# Patient Record
Sex: Male | Born: 1950 | Race: White | Hispanic: No | Smoking: Former smoker
Health system: Southern US, Community
[De-identification: ages and names within clinical notes are randomized; demographics above are authoritative.]

## PROBLEM LIST (undated history)

## (undated) DIAGNOSIS — D126 Benign neoplasm of colon, unspecified: Secondary | ICD-10-CM

## (undated) DIAGNOSIS — A6000 Herpesviral infection of urogenital system, unspecified: Secondary | ICD-10-CM

## (undated) DIAGNOSIS — K2270 Barrett's esophagus without dysplasia: Secondary | ICD-10-CM

## (undated) DIAGNOSIS — F329 Major depressive disorder, single episode, unspecified: Secondary | ICD-10-CM

## (undated) DIAGNOSIS — I10 Essential (primary) hypertension: Secondary | ICD-10-CM

## (undated) DIAGNOSIS — K635 Polyp of colon: Secondary | ICD-10-CM

## (undated) DIAGNOSIS — L309 Dermatitis, unspecified: Secondary | ICD-10-CM

## (undated) DIAGNOSIS — Z9289 Personal history of other medical treatment: Secondary | ICD-10-CM

## (undated) DIAGNOSIS — G459 Transient cerebral ischemic attack, unspecified: Secondary | ICD-10-CM

## (undated) DIAGNOSIS — I422 Other hypertrophic cardiomyopathy: Secondary | ICD-10-CM

## (undated) DIAGNOSIS — Z77090 Contact with and (suspected) exposure to asbestos: Secondary | ICD-10-CM

## (undated) DIAGNOSIS — E785 Hyperlipidemia, unspecified: Secondary | ICD-10-CM

## (undated) DIAGNOSIS — K219 Gastro-esophageal reflux disease without esophagitis: Secondary | ICD-10-CM

## (undated) DIAGNOSIS — F32A Depression, unspecified: Secondary | ICD-10-CM

## (undated) HISTORY — DX: Gastro-esophageal reflux disease without esophagitis: K21.9

## (undated) HISTORY — PX: ULNAR NERVE TRANSPOSITION: SHX2595

## (undated) HISTORY — DX: Essential (primary) hypertension: I10

## (undated) HISTORY — DX: Major depressive disorder, single episode, unspecified: F32.9

## (undated) HISTORY — DX: Depression, unspecified: F32.A

## (undated) HISTORY — DX: Other hypertrophic cardiomyopathy: I42.2

## (undated) HISTORY — DX: Hyperlipidemia, unspecified: E78.5

## (undated) HISTORY — DX: Personal history of other medical treatment: Z92.89

## (undated) HISTORY — DX: Polyp of colon: K63.5

## (undated) HISTORY — PX: SPINE SURGERY: SHX786

## (undated) HISTORY — DX: Barrett's esophagus without dysplasia: K22.70

## (undated) HISTORY — PX: CERVICAL SPINE SURGERY: SHX589

## (undated) HISTORY — DX: Contact with and (suspected) exposure to asbestos: Z77.090

## (undated) HISTORY — DX: Dermatitis, unspecified: L30.9

## (undated) HISTORY — DX: Benign neoplasm of colon, unspecified: D12.6

## (undated) HISTORY — DX: Herpesviral infection of urogenital system, unspecified: A60.00

## (undated) HISTORY — DX: Transient cerebral ischemic attack, unspecified: G45.9

---

## 2000-02-10 ENCOUNTER — Encounter (INDEPENDENT_AMBULATORY_CARE_PROVIDER_SITE_OTHER): Payer: Self-pay | Admitting: Specialist

## 2000-02-10 ENCOUNTER — Ambulatory Visit (HOSPITAL_COMMUNITY): Admission: RE | Admit: 2000-02-10 | Discharge: 2000-02-10 | Payer: Self-pay | Admitting: Gastroenterology

## 2000-08-18 ENCOUNTER — Ambulatory Visit (HOSPITAL_COMMUNITY): Admission: RE | Admit: 2000-08-18 | Discharge: 2000-08-18 | Payer: Self-pay | Admitting: Neurosurgery

## 2001-04-14 ENCOUNTER — Emergency Department (HOSPITAL_COMMUNITY): Admission: EM | Admit: 2001-04-14 | Discharge: 2001-04-14 | Payer: Self-pay | Admitting: Emergency Medicine

## 2003-01-02 ENCOUNTER — Encounter: Payer: Self-pay | Admitting: Family Medicine

## 2003-01-02 ENCOUNTER — Ambulatory Visit (HOSPITAL_COMMUNITY): Admission: RE | Admit: 2003-01-02 | Discharge: 2003-01-02 | Payer: Self-pay | Admitting: Family Medicine

## 2003-09-30 ENCOUNTER — Ambulatory Visit (HOSPITAL_COMMUNITY): Admission: RE | Admit: 2003-09-30 | Discharge: 2003-09-30 | Payer: Self-pay | Admitting: General Surgery

## 2003-10-15 ENCOUNTER — Ambulatory Visit (HOSPITAL_COMMUNITY): Admission: RE | Admit: 2003-10-15 | Discharge: 2003-10-15 | Payer: Self-pay | Admitting: General Surgery

## 2003-10-15 ENCOUNTER — Encounter (INDEPENDENT_AMBULATORY_CARE_PROVIDER_SITE_OTHER): Payer: Self-pay | Admitting: *Deleted

## 2010-09-30 ENCOUNTER — Ambulatory Visit: Payer: Self-pay | Admitting: Cardiovascular Disease

## 2010-12-17 LAB — HM COLONOSCOPY

## 2011-03-22 ENCOUNTER — Encounter: Payer: Self-pay | Admitting: Family Medicine

## 2011-04-15 NOTE — Op Note (Signed)
[place]  Patient:    Walter Cross, Walter Cross                     MRN: [id number] Proc. Date: 08/18/00 Adm. Date:  [id number] Disc. 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  PROCEDURE PERFORMED:  Right ulnar nerve release.  SURGEON:  Julio Sicks, M.D.  ASSISTANT:  None.  ANESTHESIA:  General endotracheal.  INDICATIONS:  The patient is a 60 year old male with history of a right-sided ulnar neuropathy which has failed conservative management.  Workup including EMGs and nerve conduction studies demonstrates entrapment at the elbow.  The patient presents now for ulnar nerve release and for hopeful relief of his symptoms.  DESCRIPTION OF PROCEDURE:  The patient was brought to the operating room and on the table in a supine position.  After an adequate level of anesthesia was achieved, the patients right upper extremity was prepped and draped sterilely.  A curvilinear incision was made around his medial epicondyle. This was carried down sharply through the subcutaneous tissue.  The ulnar nerve was identified proximally and decompressed through the cubital tunnel and ulnar groove.  All branches of the ulnar nerve were spared.  There is no evidence of any continued compression of the ulnar nerve.  The ulnar nerve was stable with flexion and extension of the elbow.  At this point, the wound was copiously irrigated with antibiotic solution. The wound was then closed with 3-0 Vicryl suture in a subcutaneous layer, and a running 3-0 nylon in an interlocking vertical mattress fashion.  A sterile dressing was applied.  There were no apparent complications.  The patient tolerated the procedure well and he returned to the recovery  room postoperative. DD:  08/18/00 TD:  08/20/00 Job: [id number]

## 2011-04-15 NOTE — Op Note (Signed)
NAME:  Walter Cross, Walter Cross                      ACCOUNT NO.:  [id number]   MEDICAL RECORD NO.:  [id number]                   PATIENT TYPE:  OIB   LOCATION:  2899                                 FACILITY:  [facility]   PHYSICIAN:  Leonie Man, M.D.                DATE OF BIRTH:  [birth year]   DATE OF PROCEDURE:  10/15/2003  DATE OF DISCHARGE:  10/15/2003                                 OPERATIVE REPORT   PREOPERATIVE DIAGNOSIS:  Right axillary lymphadenopathy.   POSTOPERATIVE DIAGNOSIS:  Abscess right axillary lymph nodes.   SURGEON:  Leonie Man, M.D.   ASSISTANT:  Nurse.   ANESTHESIA:  General.   INDICATIONS:  This patient is a 60 year old man who presents with a painful  mass in the right axilla felt to be a lymph node.  He underwent two courses  of antibiotic therapy with cephalosporins and with Biaxin with minimal  resolution of the node.  The node remains somewhat painful and has gotten  very slightly smaller;  however,  persisted after this course.  I discussed  excisional biopsy with the patient, and he agreed to come to the operating  room for excision of this mass.  During the course of this illness, he had  no leukocytosis or fever.  He does not have any cough or sputum production.   PROCEDURE:  Following the induction of satisfactory general anesthesia, the  right axilla was prepped and draped to be included in the sterile operative  field.  Upon palpation of the axilla under anesthesia, a large axillary mass  which was felt to be matted lymph nodes could be felt within the axilla.  A  transaxillary incision was made through the skin and subcutaneous tissues,  and carried down to the region of the mass.  Upon dissecting the mass, a  large pus pocket was entered.  Cultures were taken of this area, and the  entire inflamed mass was removed in its entirety and forwarded for  pathologic evaluation.  Gram stains of the effluent showed PMNs but no  evidence of organisms.   The mass and cultures were sent for fungus, AFB, and  regular cultures.  Another similar mass could be palpated somewhat higher in  the axilla, but it could not be reached by the transaxillary incision, so a  higher incision was made.  This incision was carried down through the skin  and subcutaneous tissues.  This mass, however, turned out to be a large  bulge within the latissimus dorsi muscle.  Both incisions were irrigated  thoroughly with normal saline. A 12-French Blake drain was placed within the  abscess cavity and brought out through the skin.  The subcutaneous tissues  were closed with interrupted 3-0 Vicryl sutures, and the skin was closed  with a 4-0 Monocryl.  After changing gloves, the upper wound was closed with  interrupted 3-0 Vicryl suture and a running 4-0 Monocryl suture.  The Linwood  drain was attached to suction.  Sponge, instrument, and sharp  counts were all verified.  The wounds were reinforced with Steri-Strips, and  sterile dressings applied.  The anesthetic was reversed, and the patient  removed from the operating room to the recovery room in stable condition,  having tolerated the procedure well.                                               Leonie Man, M.D.    PB/MEDQ  D:  10/15/2003  T:  10/16/2003  Job:  [id number]   cc:   Teena Irani. Arlyce Dice, M.D.  [address]  [place]  [place] [address]  Fax: [phone]

## 2011-04-15 NOTE — Procedures (Signed)
[name]. [facility]  Patient:    Walter Cross, Walter Cross                     MRN: [id number] Proc. Date: 02/10/00 Adm. Date:  [id number] Attending:  Rich Brave CC:         Teena Irani. Arlyce Dice, M.D.                           Procedure Report  PROCEDURE:  Upper endoscopy with biopsies.  SURGEON:  Florencia Reasons, M.D.  INDICATIONS:  Chronic heartburn symptoms, which have responded nicely to ______.  FINDINGS:  Short segment of Barretts esophagus, probable.  PROCEDURE:  The nature, purpose, risks of the procedure have been discussed with the patient, who provided written consent.  Sedation for this procedure and the  colonoscopy, which followed it, totalled fentanyl 100 mcg and Versed 10 mg IV without arrhythmias or desaturation.  The Olympus small caliber adult video endoscope was passed under direct vision.  The vocal cords looked normal.  The esophagus was easily entered.  The distal esophagus had a 1-2 cm tongue of erythematous mucosa consistent with  short segment of Barretts esophagus, but no active esophagitis was seen.  There  was no evidence of neoplasia, varices or infection anywhere in the esophagus. o definite ring, stricture or significant hiatal hernia was appreciated.  The stomach was entered.  It contained no significant residual and it had normal mucosa without evidence of gastritis, erosions, ulcers, polyps or masses and the pylorus, duodenal bulb and second duodenum looked normal.  Biopsies were obtained of the apparent short segment of Barretts mucosa prior to removal of the scope.  Retroflexed view into the proximal stomach did not demonstrate any definite hiatal hernia.  The patient tolerated the procedure well and there were no apparent complications.  IMPRESSION:  Probable short segment of Barretts esophagus, consistent with patients history of reflux disease.  PLAN:  Await pathology on the biopsies with  consideration for surveillance endoscopies every two years if intestinal metaplasia is identified. Symptomatic management of reflux with proton-pump inhibitor. DD:  02/10/00 TD:  02/10/00 Job: [id number]

## 2011-04-15 NOTE — Procedures (Signed)
[place]. [facility]  Patient:    Walter Cross, Walter Cross                     MRN: [id number] Proc. Date: 02/10/00 Adm. Date:  [id number] Attending:  Rich Brave CC:         Teena Irani. Arlyce Dice, M.D.                           Procedure Report  PROCEDURE:  Colonoscopy with polypectomy.  SURGEON:  Florencia Reasons, M.D.  INDICATIONS:  A 60 year old with intermittent small volume hematochezia.  FINDINGS:  Rectal polyp.  PROCEDURE:  The nature, purpose, and risks of the procedure had been discussed ith the patient, who provided written consent.  Sedation for this procedure and the  upper endoscopy, which preceded it, totalled fentanyl 100 mcg and Versed 10 mg V without arrhythmias or desaturation.  The Olympus adult video colonoscope was inserted following an unremarkable digital rectal examination of the prostate gland.  The scope was advanced through a somewhat angulated and looping sigmoid region and then quite easily around the colon to the cecum and then for a short distance into a normal-appearing terminal ileum, where upon pullback was initiated.  The quality of the prep was excellent and it was felt that all areas were well seen.  In the mid rectum at about 9 cm from the external anal opening, I encountered a  semipedunculated 5 mm polyp removed by snare technique with complete hemostasis and no evidence of excess cautery.  The polyp came through the suction channel easily.  No other polyps were seen and there was no evidence of cancer, colitis, vascular malformations or diverticular disease.  Retroflexion view in the distal rectum prior to the polypectomy showed a hypertrophied anal papillae.  The patient did not appear to have excessive hemorrhoidal disease.  However, no alternative source of the patients bleeding as identified since the polyp was very smooth and clean-surfaced.  The patient tolerated the procedure well and there  were no apparent complications.  IMPRESSION: 1. Small rectal polyp removed as described above. 2. No definite cause for the intermittent rectal bleeding identified, so it was  presumed to be of hemorrhoidal origin.  PLAN:  Await pathology on polyp with follow-up in three years if it was adenomatous in character. DD:  02/10/00 TD:  02/10/00 Job: [id number]

## 2011-06-06 ENCOUNTER — Telehealth: Payer: Self-pay | Admitting: Cardiovascular Disease

## 2011-06-06 ENCOUNTER — Encounter (HOSPITAL_BASED_OUTPATIENT_CLINIC_OR_DEPARTMENT_OTHER)
Admission: RE | Admit: 2011-06-06 | Discharge: 2011-06-06 | Disposition: A | Discharge status: Home / Self Care | Payer: Worker's Compensation | Source: Ambulatory Visit | Attending: Orthopedic Surgery | Admitting: Orthopedic Surgery

## 2011-06-06 LAB — BASIC METABOLIC PANEL
BUN: 12 mg/dL (ref 6–23)
CO2: 28 mEq/L (ref 19–32)
Calcium: 9.1 mg/dL (ref 8.4–10.5)
Chloride: 102 mEq/L (ref 96–112)
Creatinine, Ser: 0.9 mg/dL (ref 0.50–1.35)
GFR calc Af Amer: >60 mL/min (ref >60)
GFR calc non Af Amer: >60 mL/min (ref >60)
Glucose, Bld: 118 mg/dL — ABNORMAL HIGH (ref 70–99)
Potassium: 4.3 mEq/L (ref 3.5–5.1)
Sodium: 138 mEq/L (ref 135–145)

## 2011-06-07 ENCOUNTER — Ambulatory Visit (HOSPITAL_BASED_OUTPATIENT_CLINIC_OR_DEPARTMENT_OTHER)
Admission: RE | Admit: 2011-06-07 | Discharge: 2011-06-07 | Disposition: A | Discharge status: Home / Self Care | Payer: Worker's Compensation | Source: Ambulatory Visit | Attending: Orthopedic Surgery | Admitting: Orthopedic Surgery

## 2011-06-07 DIAGNOSIS — IMO0002 Reserved for concepts with insufficient information to code with codable children: Secondary | ICD-10-CM | POA: Insufficient documentation

## 2011-06-07 DIAGNOSIS — X58XXXA Exposure to other specified factors, initial encounter: Secondary | ICD-10-CM | POA: Insufficient documentation

## 2011-06-07 DIAGNOSIS — I10 Essential (primary) hypertension: Secondary | ICD-10-CM | POA: Insufficient documentation

## 2011-06-07 DIAGNOSIS — Z01812 Encounter for preprocedural laboratory examination: Secondary | ICD-10-CM | POA: Insufficient documentation

## 2011-06-07 LAB — POCT HEMOGLOBIN-HEMACUE: Hemoglobin: 14.8 g/dL (ref 13.0–17.0)

## 2011-06-10 ENCOUNTER — Other Ambulatory Visit: Payer: Self-pay | Admitting: Cardiovascular Disease

## 2011-06-10 NOTE — Telephone Encounter (Signed)
Needs ov gave 30 with no refills and wrote it on script bottle to make ov.

## 2011-06-10 NOTE — Op Note (Signed)
Walter Cross, Walter Cross            ACCOUNT NO.:  [id number]  MEDICAL RECORD NO.:  [id number]  LOCATION:                                 FACILITY:  PHYSICIAN:  Walter Fitch. Romello Hoehn, M.D.      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  OPERATION: 1. 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 2. Application of a Ligamentotaxor external fixation frame to allow     reduction and early range of motion of pilon fracture of right ring     finger proximal interphalangeal joint/articular surface of middle     phalanx.  SURGEON:  Walter Fitch. Brissia Delisa, MD  ASSISTANT:  None.  ANESTHESIA:  General by LMA supplemented by digital block.  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  [facility] on [facility] 13, 2011, and noted to have a fracture of the articular surface of his right ring finger proximal interphalangeal joint, middle phalangeal joint surface with a 60% volar fragment.  He was splinted and advised to seek Hand Surgery consult.  For various reasons, he delayed his consultation until 24 days postinjury.  On exam at that time he was noted to  have a dorsally subluxed/dislocated PIP joint with ulnar deviation and a significant contacted middle phalangeal articular fracture surface to the dorsum of the proximal phalanx.  We advised Walter Cross as to the spectrum of treatment options for this complex fracture dislocation.  We advised him to try a PIP distractor that would allow extension block splinting and gradual reduction of the joint.  Alternative treatment strategies including a hemi hamate arthroplasty versus a traditional Eaton volar plate arthroplasty with collateral ligament resection were discussed and tabled at the present time.  He understands that for the salvage of this complex injury he might one day face implant arthroplasty of the PIP joint.  Questions were invited and answered in detail.  PROCEDURE:  Walter Cross was brought to room 2 of the [facility] surgical center and placed in supine position on the operating table.  Following an anesthesia consult with Walter Cross, general anesthesia by LMA technique was recommended and accepted.Under Dr. Marlane Mingle direct supervision, general anesthesia was induced, followed by routine Betadine scrub and paint of right upper extremity. A pneumatic tourniquet was applied to proximal right brachium.  Following a routine surgical time-out, the right arm was exsanguinated with an Esmarch bandage and arterial tourniquet inflated to 220 mmHg. The procedure  commenced with an attempt at traction and flexion of the joint.  This was performed with the aid of a C-arm fluoroscope that was brought into sterile field and properly draped.  There was levering of the middle phalanx against the proximal phalanx.  This was a static chronic dislocation.  Given the circumstance, we proceeded directly to application of external fixation frame.  Following the protocol of the Ligamentotaxor device, a 1.2-mm K-wire was placed through the axis rotation of the proximal phalangeal head,  confirmed with C-arm to be in proper position followed by use of a drill guide to place a diaphyseal transverse pin through the middle phalanx.  The device was assembled according to protocol and traction applied with warm screw springs.  A third K-wire was used to create stiffness of the frame.  After 15 minutes of traction, we were able to reduce the joint and flex between 0 and 70 degrees of flexion.  Our plan is to allow the finger to remain in a distracted position for 3 days.  Thereafter, we will see Walter Cross in the office and initiate flexion exercises and extension block splinting.  C-arm fluoroscopic images were obtained documenting satisfactory position of the pins and reduction of the joint.  The pin tracts were then dressed with Xeroflo, sterile gauze, and a Coban dressing.  Tape was used to secure the traction springs.  There were no apparent complications.     Walter Cross, M.D.     RVS/MEDQ  D:  06/07/2011  T:  06/08/2011  Job:  [id number]  Electronically Signed by Josephine Igo M.D. on 06/10/2011 08:42:58 AM

## 2011-06-30 NOTE — Telephone Encounter (Signed)
CLOSE

## 2011-07-04 ENCOUNTER — Other Ambulatory Visit: Payer: Self-pay | Admitting: Gastroenterology

## 2011-07-18 IMAGING — CR DG CERVICAL SPINE COMPLETE 4+V
5 series · 5 of 5 positions shown · non-contrast
Comparison: None available

CLINICAL DATA: Posterior neck and right shoulder pain.

CERVICAL SPINE - COMPLETE 4+ VIEW

[w c-spine lat]
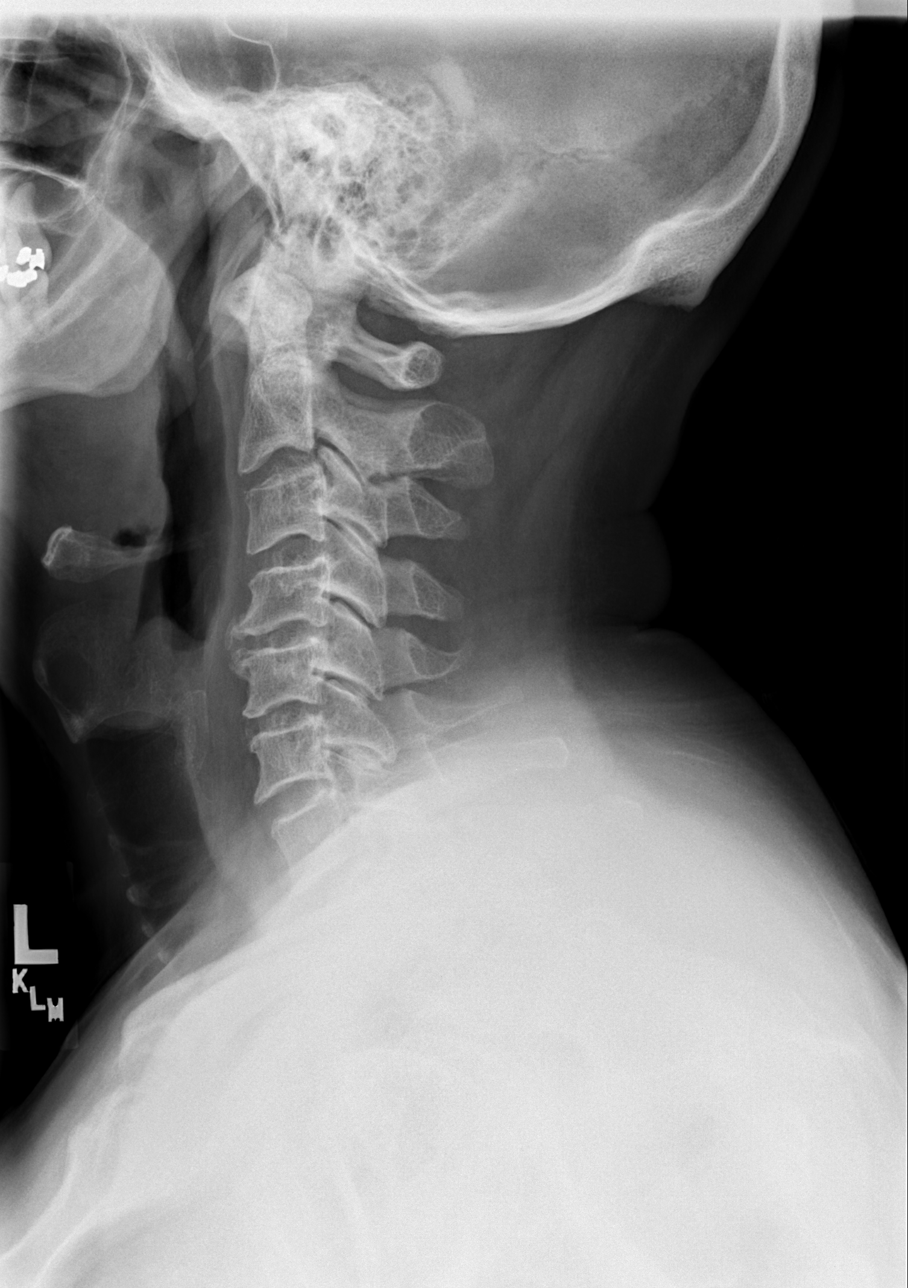

[w c-spine oblique (1 of 2)]
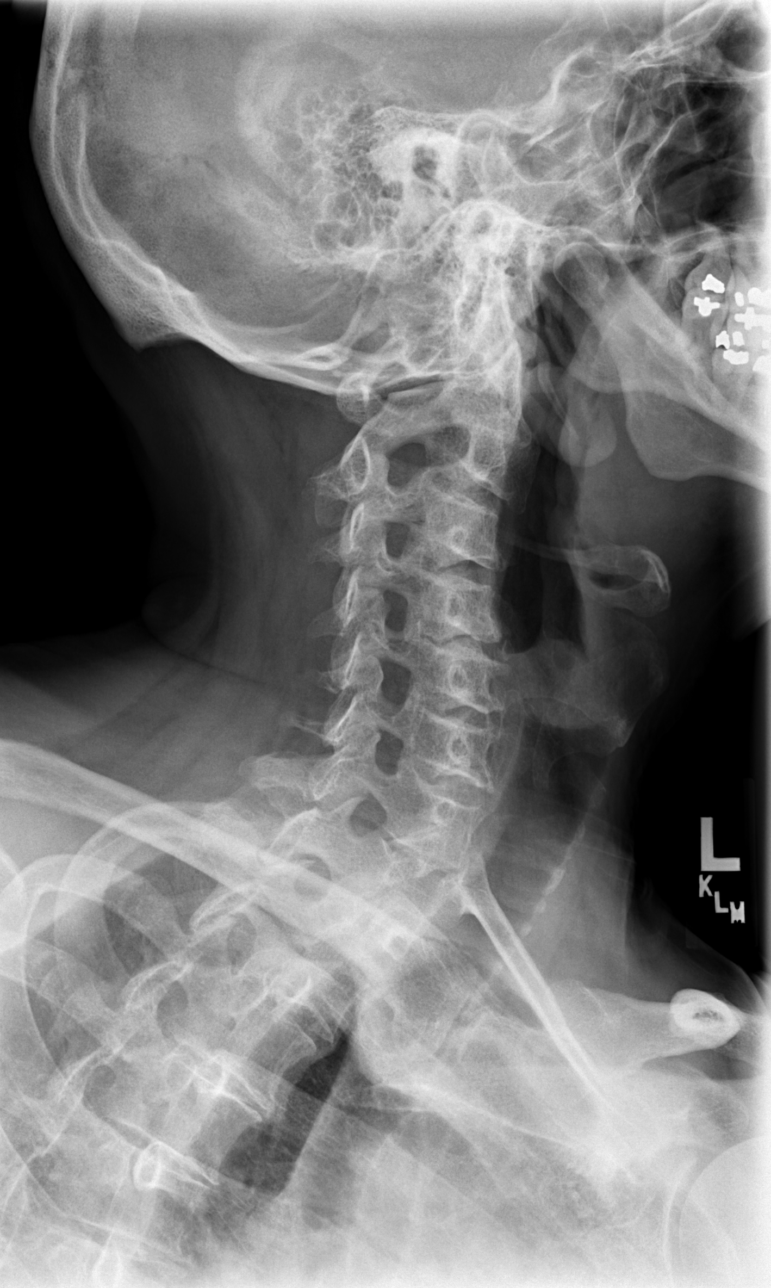

[w c-spine oblique (2 of 2)]
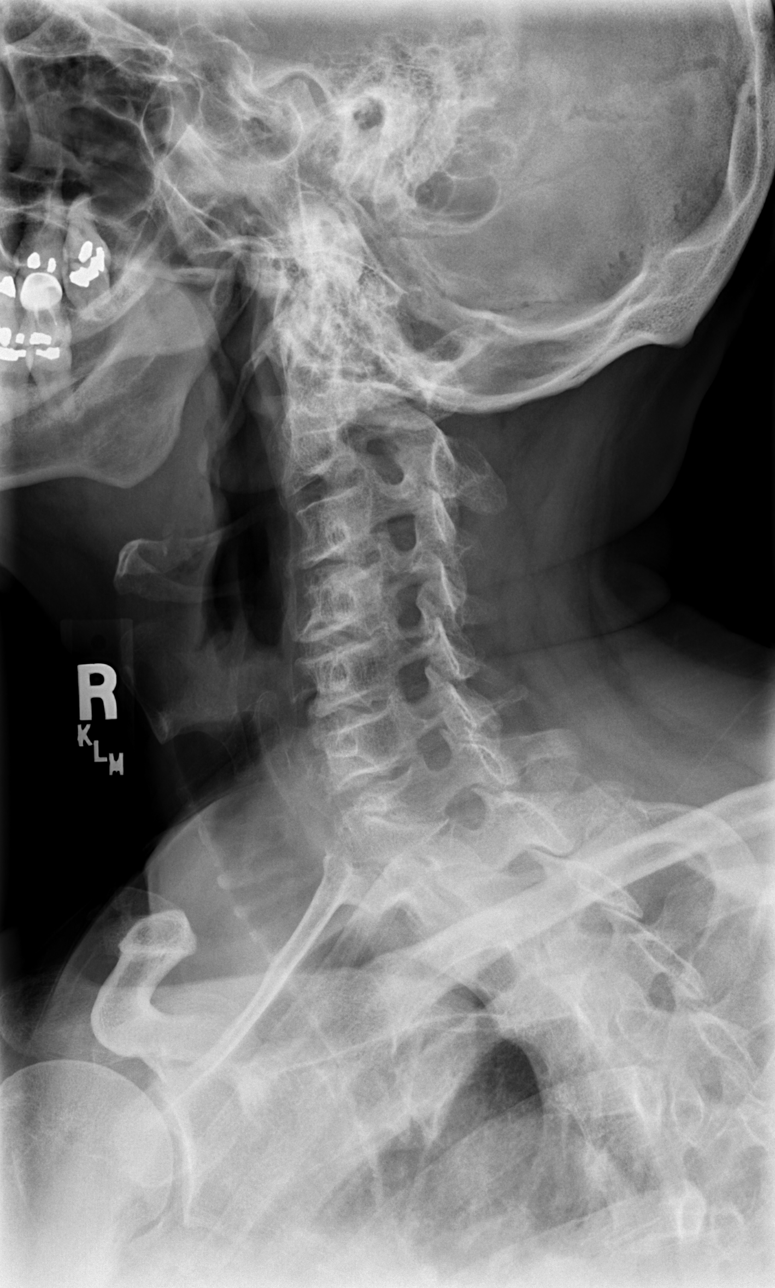

[w c-spine a.p. *]
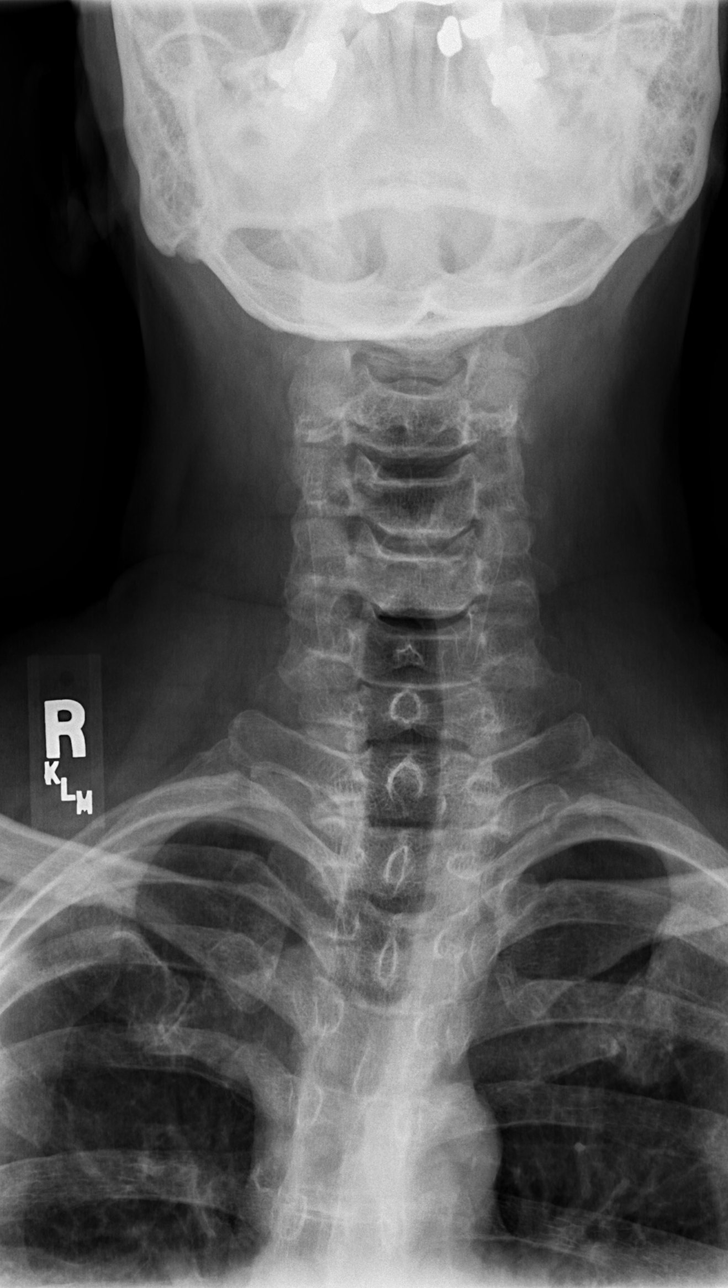

[w c-spine odontoid *]
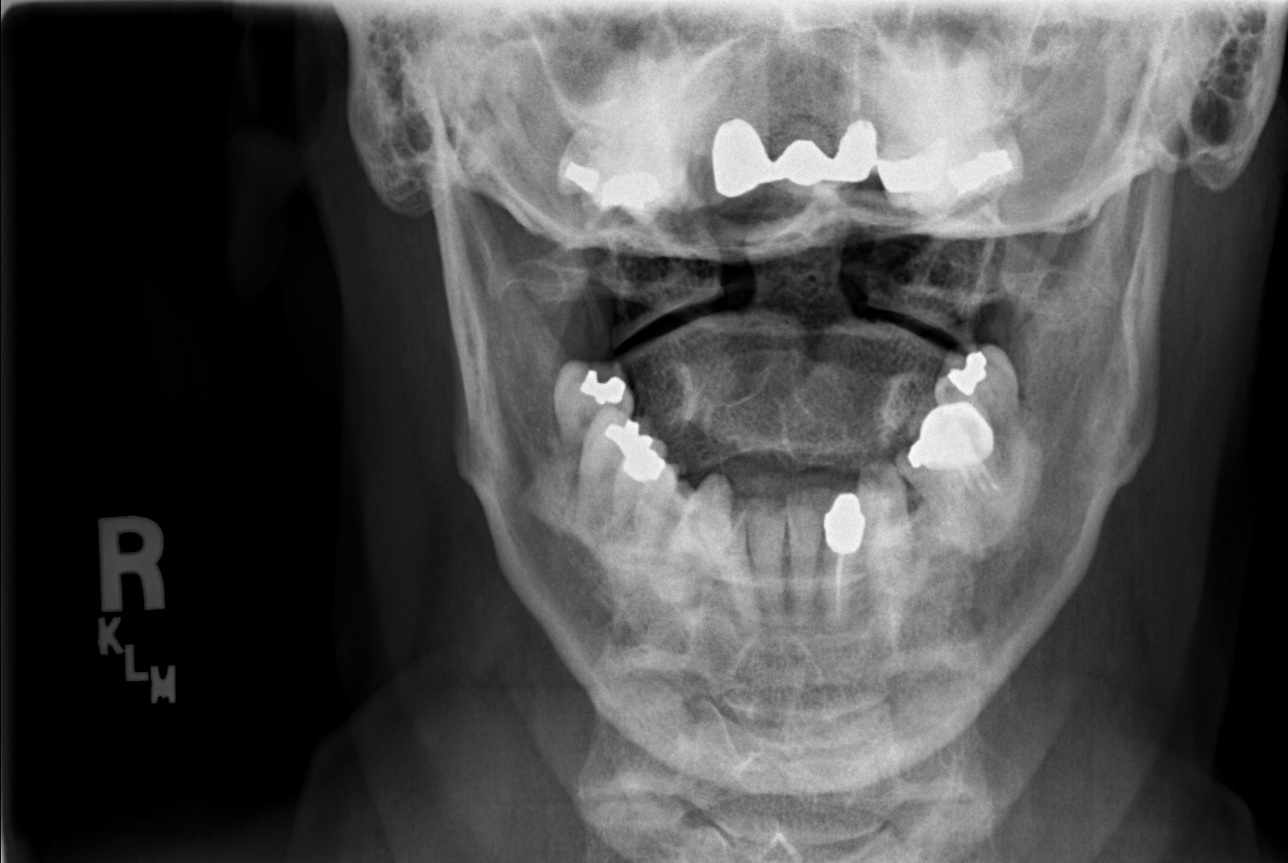

[5 of 5 positions shown; findings below may reference images not displayed]

FINDINGS: Mild narrowing of the C4-5 interspace with anterior
endplate spurring.  Normal alignment.  No prevertebral soft tissue
swelling.  No significant osseous foraminal stenosis.  Negative for
fracture. Normal mineralization.
IMPRESSION: 1.  Degenerative disc disease C4-5

## 2011-08-10 ENCOUNTER — Other Ambulatory Visit: Payer: Self-pay | Admitting: Cardiovascular Disease

## 2011-08-10 MED ORDER — DILTIAZEM HCL ER COATED BEADS 180 MG PO CP24: 180.0000 mg | ORAL_CAPSULE | Freq: Every day | ORAL | Status: DC

## 2011-08-10 NOTE — Telephone Encounter (Signed)
Pt called at work and verified dose strength of cardizem, he was breaking a 360mg  protective coated med in half, new strength ordered and pt aware not to break in half, he needs an app also, he is to call back for one.Pt verbalized understanding. Alfonso Ramus RN

## 2011-08-16 ENCOUNTER — Other Ambulatory Visit: Payer: Self-pay | Admitting: Cardiovascular Disease

## 2011-08-17 ENCOUNTER — Other Ambulatory Visit: Payer: Self-pay | Admitting: *Deleted

## 2011-08-17 MED ORDER — CARVEDILOL 6.25 MG PO TABS: 6.2500 mg | ORAL_TABLET | Freq: Two times a day (BID) | ORAL | Status: DC

## 2011-08-17 NOTE — Telephone Encounter (Signed)
Fax received from pharmacy. Refill completed. Jodette Lihanna Biever RN  

## 2011-08-25 ENCOUNTER — Encounter: Payer: Self-pay | Admitting: Cardiovascular Disease

## 2011-08-25 ENCOUNTER — Ambulatory Visit (INDEPENDENT_AMBULATORY_CARE_PROVIDER_SITE_OTHER): Payer: BC Managed Care – PPO | Admitting: Cardiovascular Disease

## 2011-08-25 VITALS — BP 122/68 | HR 88 | Ht 70.0 in | Wt 168.4 lb

## 2011-08-25 DIAGNOSIS — I421 Obstructive hypertrophic cardiomyopathy: Secondary | ICD-10-CM

## 2011-08-25 DIAGNOSIS — I517 Cardiomegaly: Secondary | ICD-10-CM | POA: Insufficient documentation

## 2011-08-25 MED ORDER — CARVEDILOL 6.25 MG PO TABS: 6.2500 mg | ORAL_TABLET | Freq: Two times a day (BID) | ORAL | Status: DC

## 2011-08-25 MED ORDER — CARVEDILOL 12.5 MG PO TABS: 12.5000 mg | ORAL_TABLET | Freq: Two times a day (BID) | ORAL | Status: DC

## 2011-08-25 NOTE — Patient Instructions (Signed)
Increase your coreg to 6.25 TWICE a day instead of 1 a day. Return to see Dr. Elease Hashimoto in 6 months.

## 2011-08-25 NOTE — Progress Notes (Signed)
Walter Cross Date of Birth  [birth year] [facility] [address]    [address] [place], [place]  [address] [phone]  Fax  [phone]  History of Present Illness:  60 year old gentleman with a history of a mild hypertrophic obstructive cardiomyopathy. He is done very well on medical therapy. He's been on carvedilol and Cardizem. He does not have any episodes of chest pain or shortness breath. He exercises occasionally.  Current Outpatient Prescriptions on File Prior to Visit  Medication Sig Dispense Refill  . acyclovir (ZOVIRAX) 400 MG tablet Take 400 mg by mouth 2 (two) times daily.        Marland Kitchen acyclovir (ZOVIRAX) 5 % ointment Apply topically every 3 (three) hours.        Marland Kitchen aspirin 81 MG tablet Take 81 mg by mouth daily.        . carvedilol (COREG) 6.25 MG tablet Take 1 tablet (6.25 mg total) by mouth 2 (two) times daily with a meal.  60 tablet  5  . cimetidine (TAGAMET) 200 MG tablet Take 200 mg by mouth 2 (two) times daily.        Marland Kitchen diltiazem (CARDIZEM CD) 180 MG 24 hr capsule Take 1 capsule (180 mg total) by mouth daily.  90 capsule  0  . ibuprofen (ADVIL,MOTRIN) 200 MG tablet Take 200 mg by mouth every 6 (six) hours as needed.        . nystatin-triamcinolone (MYCOLOG) ointment Apply 1 application topically 2 (two) times daily.        . ramipril (ALTACE) 10 MG tablet Take 10 mg by mouth daily.          Allergies  Allergen Reactions  . Nexium Other (See Comments)    cramps  . Septra (Bactrim) Other (See Comments)    Back ache    Past Medical History  Diagnosis Date  . Hypertension   . Hyperlipidemia   . GERD (gastroesophageal reflux disease)   . Benign colon polyp   . Genital HSV   . Dermatitis     axillary  . 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 Vitamin D deficiency     Past Surgical History  Procedure Date  . Cervical spine surgery 1992-1993    nerve release, Dr Gerlene Fee  . Spine surgery     L4-L5, 1992-1993    History  Smoking status    . Former Smoker  Smokeless tobacco  . Not on file    History  Alcohol Use  . Yes    Family History  Problem Relation Age of Onset  . Cancer Mother 66    cervical  . Cancer Father     lung    Reviw of Systems:  Reviewed in the HPI.  All other systems are negative.  Physical Exam: BP 122/68  Pulse 88  Ht 5\' 10"  (1.778 m)  Wt 168 lb 6.4 oz (76.386 kg)  BMI 24.16 kg/m2 The patient is alert and oriented x 3.  The mood and affect are normal.   Skin: warm and dry.  Color is normal.    HEENT:   the sclera are nonicteric.  The mucous membranes are moist.  The carotids are 2+ without bruits.  There is no thyromegaly.  There is no JVD.    Lungs: clear.  The chest wall is non tender.    Heart: regular rate with a normal S1 and S2.  There are no murmurs, gallops, or rubs. The PMI is  not displaced.     Abdomen: good bowel sounds.  There is no guarding or rebound.  There is no hepatosplenomegaly or tenderness.  There are no masses.   Extremities:  no clubbing, cyanosis, or edema.  The legs are without rashes.  The distal pulses are intact.   Neuro:  Cranial nerves II - XII are intact.  Motor and sensory functions are intact.    The gait is normal.   Assessment / Plan:

## 2011-08-25 NOTE — Assessment & Plan Note (Addendum)
His murmur sounds a little bit lower today. In addition his heart rate is 80.  He admitted to me that he was only taking his carvedilol once a day to twice a day. I've encouraged him to take his carvedilol twice a day as instructed. We'll reassess him again in 6 months.

## 2011-11-10 ENCOUNTER — Other Ambulatory Visit: Payer: Self-pay | Admitting: Cardiovascular Disease

## 2012-02-21 ENCOUNTER — Encounter: Payer: Self-pay | Admitting: Cardiovascular Disease

## 2012-02-21 ENCOUNTER — Ambulatory Visit (INDEPENDENT_AMBULATORY_CARE_PROVIDER_SITE_OTHER): Payer: BC Managed Care – PPO | Admitting: Cardiovascular Disease

## 2012-02-21 VITALS — BP 123/77 | HR 85 | Ht 69.0 in | Wt 171.8 lb

## 2012-02-21 DIAGNOSIS — I421 Obstructive hypertrophic cardiomyopathy: Secondary | ICD-10-CM

## 2012-02-21 NOTE — Assessment & Plan Note (Signed)
Walter Cross is doing very well. Will continue the same medications. I'll see him again in 6 months.

## 2012-02-21 NOTE — Patient Instructions (Signed)
Your physician wants you to follow-up in: 6 MONTHS  You will receive a reminder letter in the mail two months in advance. If you don't receive a letter, please call our office to schedule the follow-up appointment.  Please provide [place] a copy of your last labs done with your primary physician, thank you

## 2012-02-21 NOTE — Progress Notes (Signed)
Walter Cross Date of Birth  1951/07/19 [facility] [address]    [address] [place], [place]  [address] [phone]  Fax  [phone]  History of Present Illness:  61 year old gentleman with a history of a mild hypertrophic obstructive cardiomyopathy. He is done very well on medical therapy. He's been on carvedilol and Cardizem. He does not have any episodes of chest pain or shortness breath. He exercises occasionally.  He's looking forward to getting up and walking work this summer and spring.  Current Outpatient Prescriptions on File Prior to Visit  Medication Sig Dispense Refill  . acyclovir (ZOVIRAX) 5 % ointment Apply topically every 3 (three) hours.        Marland Kitchen aspirin 81 MG tablet Take 81 mg by mouth daily.        . carvedilol (COREG) 6.25 MG tablet Take 1 tablet (6.25 mg total) by mouth 2 (two) times daily with a meal.  60 tablet  5  . cimetidine (TAGAMET) 200 MG tablet Take 200 mg by mouth 2 (two) times daily.        Marland Kitchen diltiazem (CARDIZEM CD) 180 MG 24 hr capsule take 1 capsule by mouth once daily  90 capsule  3  . ibuprofen (ADVIL,MOTRIN) 200 MG tablet Take 200 mg by mouth every 6 (six) hours as needed.        . nystatin-triamcinolone (MYCOLOG) ointment Apply 1 application topically 2 (two) times daily.        . ramipril (ALTACE) 10 MG tablet Take 10 mg by mouth daily.          Allergies  Allergen Reactions  . Nexium Other (See Comments)    cramps  . Septra (Bactrim) Other (See Comments)    Back ache    Past Medical History  Diagnosis Date  . Hypertension   . Hyperlipidemia   . GERD (gastroesophageal reflux disease)   . Benign colon polyp   . Genital HSV   . Dermatitis     axillary  . 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 Vitamin d deficiency     Past Surgical History  Procedure Date  . Cervical spine surgery 1992-1993    nerve release, Dr Gerlene Fee  . Spine surgery     L4-L5, 1992-1993    History  Smoking status  . Former Smoker    Smokeless tobacco  . Not on file    History  Alcohol Use  . Yes    Family History  Problem Relation Age of Onset  . Cancer Mother 66    cervical  . Cancer Father     lung    Reviw of Systems:  Reviewed in the HPI.  All other systems are negative.  Physical Exam: BP 123/77  Pulse 85  Ht 5\' 9"  (1.753 m)  Wt 171 lb 12.8 oz (77.928 kg)  BMI 25.37 kg/m2 The patient is alert and oriented x 3.  The mood and affect are normal.   Skin: warm and dry.  Color is normal.    HEENT:   the sclera are nonicteric.  The mucous membranes are moist.  The carotids are 2+ without bruits.  There is no thyromegaly.  There is no JVD.    Lungs: clear.  The chest wall is non tender.    Heart: regular rate with a normal S1 and S2.  There is a 1-2 / 6 systolic murmur at the left sternal border.  Abdomen: good bowel sounds.  There  is no guarding or rebound.  There is no hepatosplenomegaly or tenderness.  There are no masses.   Extremities:  no clubbing, cyanosis, or edema.  The legs are without rashes.  The distal pulses are intact.   Neuro:  Cranial nerves II - XII are intact.  Motor and sensory functions are intact.    The gait is normal.  ECG: 02/21/2012-normal sinus rhythm. No ST or T wave changes.  Assessment / Plan:

## 2012-03-07 ENCOUNTER — Ambulatory Visit
Admission: RE | Admit: 2012-03-07 | Discharge: 2012-03-07 | Disposition: A | Discharge status: Home / Self Care | Payer: BC Managed Care – PPO | Source: Ambulatory Visit | Attending: Family Medicine | Admitting: Family Medicine

## 2012-03-07 ENCOUNTER — Other Ambulatory Visit: Payer: Self-pay | Admitting: Family Medicine

## 2012-03-07 DIAGNOSIS — T148XXA Other injury of unspecified body region, initial encounter: Secondary | ICD-10-CM

## 2012-03-07 IMAGING — CR DG NASAL BONES 3+V
3 series · 3 of 3 positions shown · non-contrast
Comparison: None.

CLINICAL DATA: Fell on face 3 days ago with pain and swelling

NASAL BONES - 3+ VIEW

[w waters]
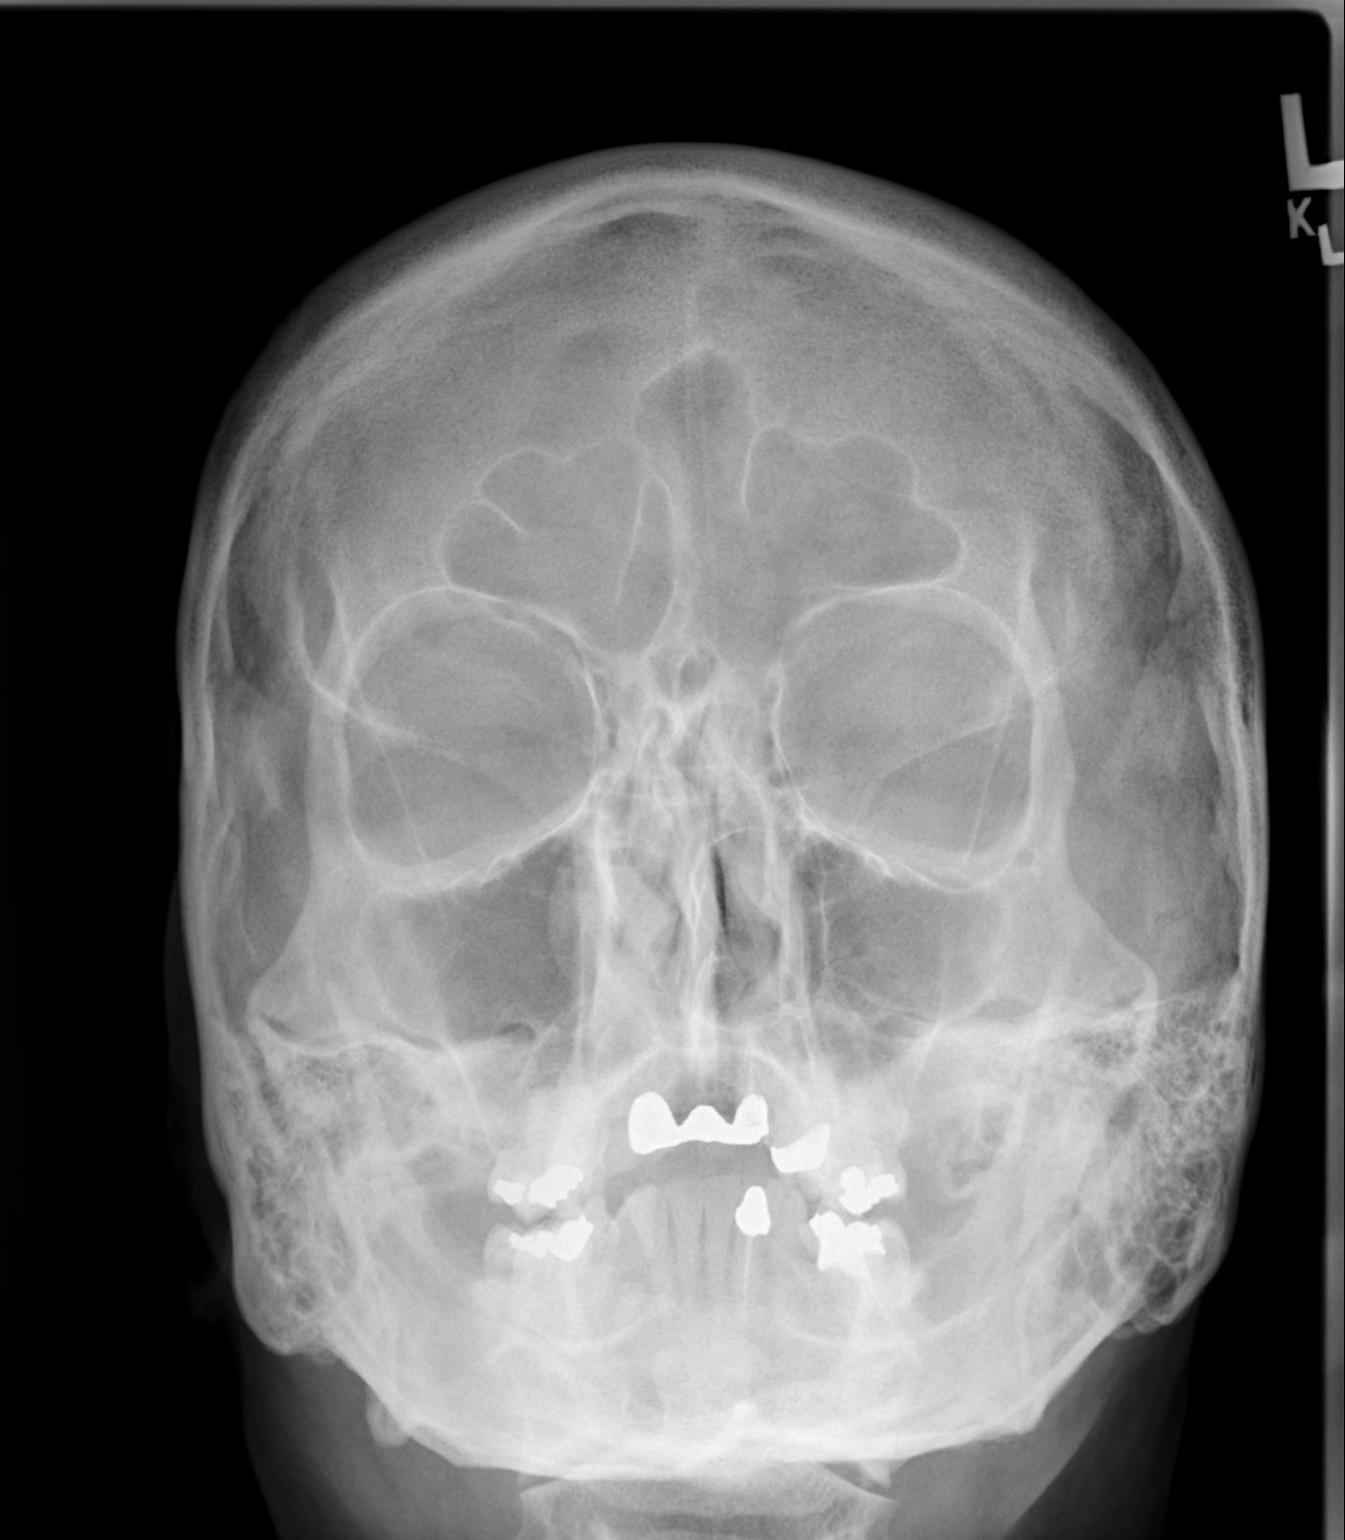

[w nasal bone lat (1 of 2)]
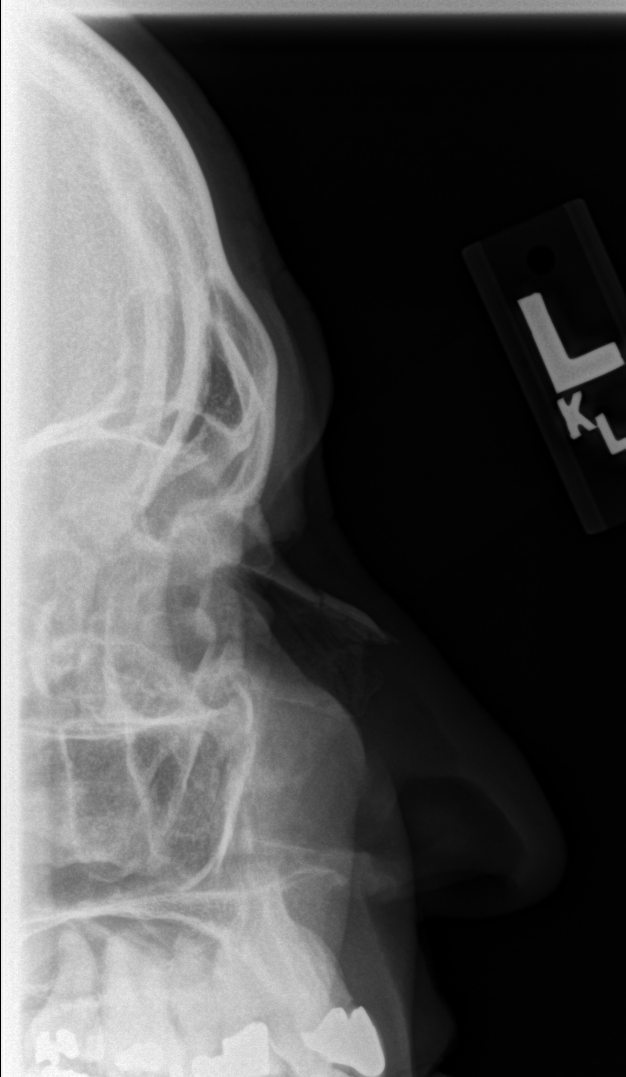

[w nasal bone lat (2 of 2)]
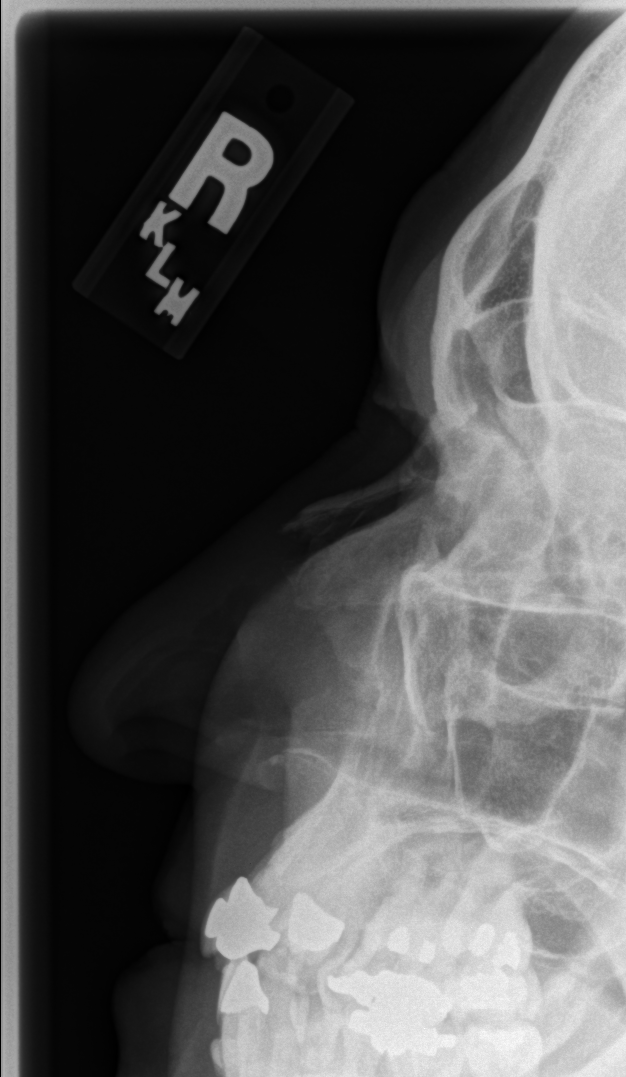

[3 of 3 positions shown; findings below may reference images not displayed]

FINDINGS: There is a fracture through the mid portion of the nasal
bone without significant displacement.  No other acute abnormality
is seen.  The paranasal sinuses that are visualized are clear.
IMPRESSION: Nondisplaced nasal bone fracture.

## 2012-08-02 ENCOUNTER — Encounter: Payer: Self-pay | Admitting: Cardiovascular Disease

## 2012-08-02 ENCOUNTER — Encounter: Payer: Self-pay | Admitting: Physician Assistant

## 2012-08-02 ENCOUNTER — Ambulatory Visit (INDEPENDENT_AMBULATORY_CARE_PROVIDER_SITE_OTHER): Payer: BC Managed Care – PPO | Admitting: Physician Assistant

## 2012-08-02 VITALS — BP 146/64 | HR 97 | Ht 69.0 in | Wt 169.0 lb

## 2012-08-02 DIAGNOSIS — F341 Dysthymic disorder: Secondary | ICD-10-CM

## 2012-08-02 DIAGNOSIS — F32A Depression, unspecified: Secondary | ICD-10-CM | POA: Insufficient documentation

## 2012-08-02 DIAGNOSIS — I421 Obstructive hypertrophic cardiomyopathy: Secondary | ICD-10-CM

## 2012-08-02 DIAGNOSIS — F329 Major depressive disorder, single episode, unspecified: Secondary | ICD-10-CM | POA: Insufficient documentation

## 2012-08-02 DIAGNOSIS — F419 Anxiety disorder, unspecified: Secondary | ICD-10-CM | POA: Insufficient documentation

## 2012-08-02 DIAGNOSIS — R079 Chest pain, unspecified: Secondary | ICD-10-CM | POA: Insufficient documentation

## 2012-08-02 NOTE — Patient Instructions (Addendum)
The current medical regimen is effective;  continue present plan and medications.  Your physician has requested that you have an echocardiogram. Echocardiography is a painless test that uses sound waves to create images of your heart. It provides your doctor with information about the size and shape of your heart and how well your heart's chambers and valves are working. This procedure takes approximately one hour. There are no restrictions for this procedure.  Follow up in 2 weeks with Dr Elease Hashimoto.

## 2012-08-02 NOTE — Progress Notes (Signed)
HPI:  This is a 61 year old white male patient who has a history of mild hypertrophic obstructive cardiomyopathy who last saw Dr. Elease Hashimoto February 21, 2012 and was doing well. He was sent has from her on some family practice after going there with 3 weeks of chest pain. Patient describes the chest pain as a tightness across his entire chest that lasts all day long. It is not aggravated with exertion, food or anything else. Sometimes he can go a half a day without any pain and he'll have tightness for half a day. He denies radiation, associated shortness of breath, dizziness, palpitations, or presyncope. 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 He has a history of anxiety depression and was given a medication but he hasn't started it. His wife died in 1996/05/24 but for some reason this year he is having trouble with it. He had labs checked by [facility], and every evening was normal except for cholesterol 224 LDL was 107 HDL 88. EKGs have been normal.   Allergies: -- Esomeprazole Magnesium -- Other (See Comments)   --  cramps  -- Septra (Bactrim) -- Other (See Comments)   --  Back ache  Current Outpatient Prescriptions on File Prior to Visit: acyclovir (ZOVIRAX) 5 % ointment, Apply topically every 3 (three) hours.  , Disp: , Rfl:  aspirin 81 MG tablet, Take 81 mg by mouth daily.  , Disp: , Rfl:  carvedilol (COREG) 6.25 MG tablet, Take 1 tablet (6.25 mg total) by mouth 2 (two) times daily with a meal., Disp: 60 tablet, Rfl: 5 Cholecalciferol (VITAMIN D PO), Take 4,000 mg by mouth daily., Disp: , Rfl:   cimetidine (TAGAMET) 200 MG tablet, Take 200 mg by mouth 2 (two) times daily.  , Disp: , Rfl:  diltiazem (CARDIZEM CD) 180 MG 24 hr capsule, take 1 capsule by mouth once daily, Disp: 90 capsule, Rfl: 3 ibuprofen (ADVIL,MOTRIN) 200 MG tablet, Take 200 mg by mouth every 6 (six) hours as needed.  , Disp: , Rfl:  nystatin-triamcinolone (MYCOLOG) ointment, Apply 1 application topically 2 (two) times daily.  , Disp: , Rfl:    ramipril (ALTACE) 10 MG tablet, Take 10 mg by mouth daily.  , Disp: , Rfl:     Past Medical History:   Hypertension                                                 Hyperlipidemia                                               GERD (gastroesophageal reflux disease)                       Benign colon polyp                                           Genital HSV  Dermatitis                                                     Comment:axillary   Exposure to asbestos                                           Comment:auto brake liners   Vitamin d deficiency                                        Past Surgical History:   CERVICAL SPINE SURGERY                          [phone]      Comment:nerve release, Dr Gerlene Fee   SPINE SURGERY                                                  Comment:L4-L5, [phone]  Review of patient's family history indicates:   Cancer                         Mother                     Comment: cervical   Cancer                         Father                     Comment: lung   Social History   Marital Status: Widowed             Spouse Name:                      Years of Education:                 Number of children:             Occupational History   None on file  Social History Main Topics   Smoking Status: Former Smoker                   Packs/Day:       Years:         Smokeless Status: Not on file                      Alcohol Use: Yes            Drug Use: No             Sexual Activity:                    Other Topics            Concern   None on file  Social History Narrative   None on file    ROS:see history of present illness otherwise negative   PHYSICAL  EXAM: Well-nournished, in no acute distress. Neck: No JVD, HJR, Bruit, or thyroid enlargement  Lungs: No tachypnea, clear without wheezing, rales, or rhonchi  Cardiovascular: RRR,positive S4 and 2/6 systolic murmur at the left sternal  border, no bruit, thrill, or heave.  Abdomen: BS normal. Soft without organomegaly, masses, lesions or tenderness.  Extremities: without cyanosis, clubbing or edema. Good distal pulses bilateral  SKin: Warm, no lesions or rashes   Musculoskeletal: No deformities  Neuro: no focal signs  BP 146/64  Pulse 97  Ht 5\' 9"  (1.753 m)  Wt 169 lb (76.658 kg)  BMI 24.96 kg/m2   AVW:UJWJXB sinus rhythm no acute change   2-D echo 2007 showed moderate LVH with impaired LV diastolic relaxation but no LVOT obstruction and mild MR

## 2012-08-02 NOTE — Assessment & Plan Note (Signed)
Patient had MS Contin chest pain for 3 weeks that seems atypical. I discussed this patient in detail with Dr. Elease Hashimoto who recommends repeating a 2-D echo on following up with him in a couple weeks. We discussed possible stress testing but will hold off until the echo is performed.

## 2012-08-02 NOTE — Assessment & Plan Note (Signed)
Patient is having trouble dealing with the passing of his wife in 50. He was given a prescription for anxiety depression medication but has not started this yet. He is following up with his primary care for this.

## 2012-08-02 NOTE — Assessment & Plan Note (Signed)
Patient has a history of mild hypertrophic obstructive cardiomyopathy. He has not had an echo in 3 years. I discussed this patient with Dr. Elease Hashimoto who would like to repeat his 2-D echo.

## 2012-08-03 ENCOUNTER — Encounter: Payer: Self-pay | Admitting: Cardiovascular Disease

## 2012-08-08 ENCOUNTER — Ambulatory Visit (HOSPITAL_COMMUNITY): Payer: BC Managed Care – PPO | Attending: Physician Assistant | Admitting: Radiology

## 2012-08-08 DIAGNOSIS — R072 Precordial pain: Secondary | ICD-10-CM | POA: Insufficient documentation

## 2012-08-08 DIAGNOSIS — I428 Other cardiomyopathies: Secondary | ICD-10-CM | POA: Insufficient documentation

## 2012-08-08 DIAGNOSIS — I369 Nonrheumatic tricuspid valve disorder, unspecified: Secondary | ICD-10-CM | POA: Insufficient documentation

## 2012-08-08 DIAGNOSIS — I421 Obstructive hypertrophic cardiomyopathy: Secondary | ICD-10-CM

## 2012-08-08 DIAGNOSIS — I10 Essential (primary) hypertension: Secondary | ICD-10-CM | POA: Insufficient documentation

## 2012-08-08 NOTE — Progress Notes (Signed)
Echocardiogram performed.  

## 2012-08-16 ENCOUNTER — Encounter: Payer: Self-pay | Admitting: Physician Assistant

## 2012-08-16 ENCOUNTER — Ambulatory Visit (INDEPENDENT_AMBULATORY_CARE_PROVIDER_SITE_OTHER)
Admission: RE | Admit: 2012-08-16 | Discharge: 2012-08-16 | Disposition: A | Discharge status: Home / Self Care | Payer: BC Managed Care – PPO | Source: Ambulatory Visit | Attending: Physician Assistant | Admitting: Physician Assistant

## 2012-08-16 ENCOUNTER — Ambulatory Visit (INDEPENDENT_AMBULATORY_CARE_PROVIDER_SITE_OTHER): Payer: BC Managed Care – PPO | Admitting: Physician Assistant

## 2012-08-16 ENCOUNTER — Telehealth: Payer: Self-pay | Admitting: *Deleted

## 2012-08-16 VITALS — BP 137/91 | HR 71 | Ht 69.0 in | Wt 172.0 lb

## 2012-08-16 DIAGNOSIS — I421 Obstructive hypertrophic cardiomyopathy: Secondary | ICD-10-CM

## 2012-08-16 DIAGNOSIS — R079 Chest pain, unspecified: Secondary | ICD-10-CM

## 2012-08-16 DIAGNOSIS — I10 Essential (primary) hypertension: Secondary | ICD-10-CM

## 2012-08-16 IMAGING — CR DG CHEST 2V
2 series · 2 of 2 positions shown · non-contrast
Comparison: None.

CLINICAL DATA: Chest pain.  Chest heaviness.

CHEST - 2 VIEW

[view not recorded (1 of 2)]
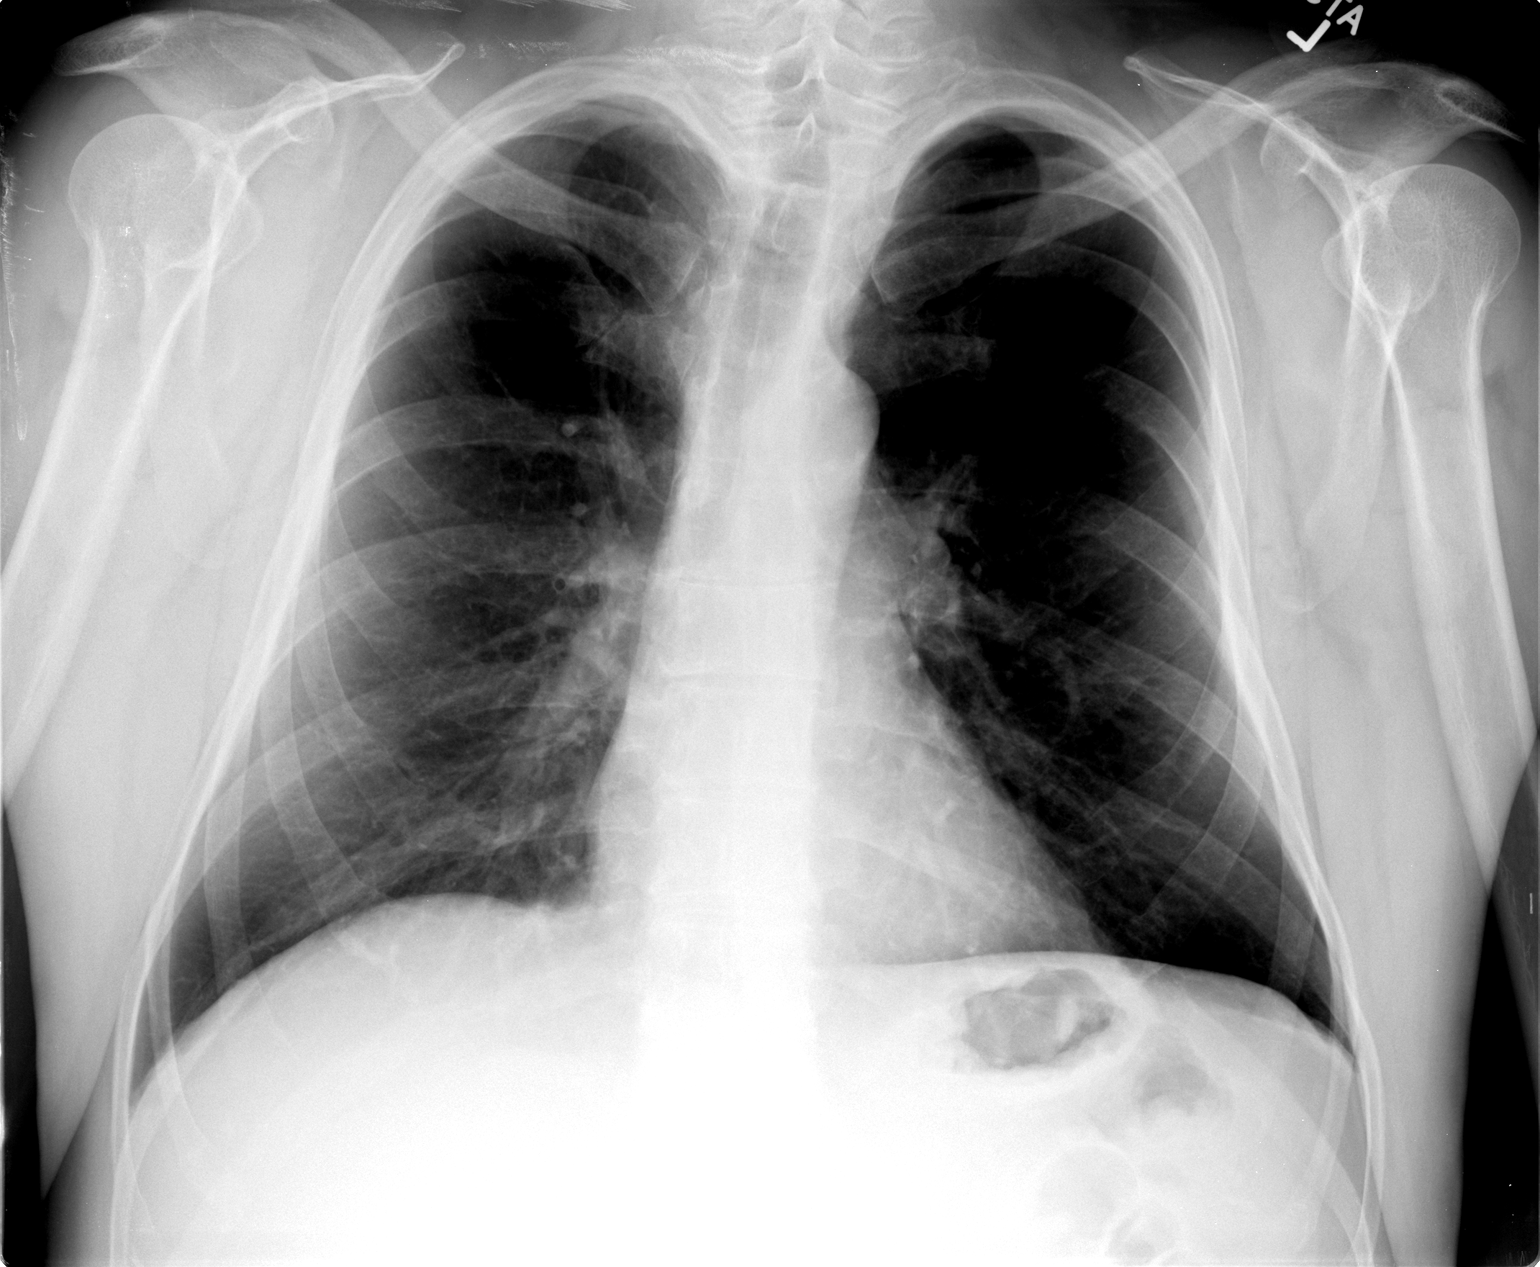

[view not recorded (2 of 2)]
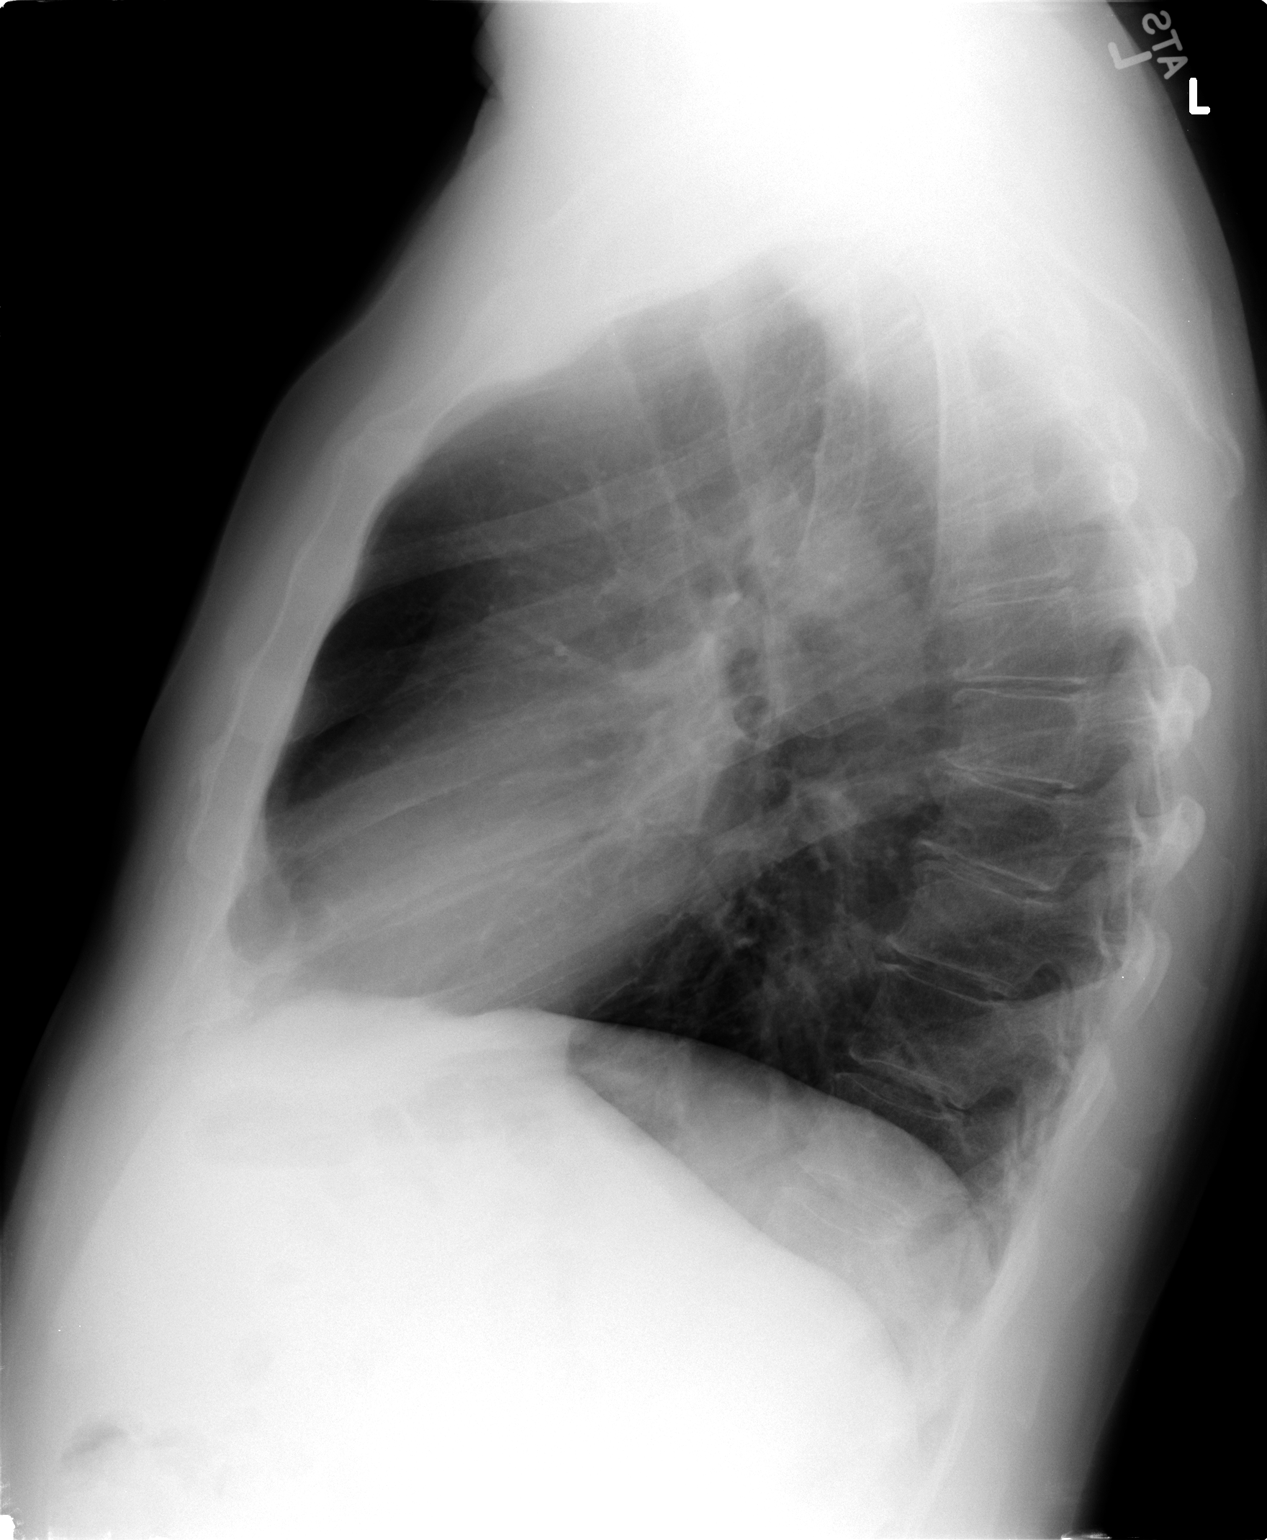

[2 of 2 positions shown; findings below may reference images not displayed]

FINDINGS: Cardiopericardial silhouette within normal limits.
Mediastinal contours normal. Trachea midline.  No airspace disease
or effusion.
IMPRESSION: Negative two-view chest.

## 2012-08-16 MED ORDER — CIMETIDINE 200 MG PO TABS: 400.0000 mg | ORAL_TABLET | Freq: Two times a day (BID) | ORAL | Status: DC

## 2012-08-16 MED ORDER — IBUPROFEN 200 MG PO TABS: 200.0000 mg | ORAL_TABLET | ORAL | Status: DC

## 2012-08-16 NOTE — Telephone Encounter (Signed)
pt notified about cxr results w/verbal understanding today 

## 2012-08-16 NOTE — Telephone Encounter (Signed)
Message copied by Tarri Fuller on Thu Aug 16, 2012  5:12 PM ------      Message from: [place], [place] T      Created: Thu Aug 16, 2012  5:03 PM       Normal chest X-ray      [place], [place]  5:03 PM 08/16/2012

## 2012-08-16 NOTE — Patient Instructions (Addendum)
Your physician has recommended you make the following change in your medication:  1.) INCREASE TAGAMET TO (400 MG) TWICE A DAY FOR TWO WEEKS THAN RESUME BACK TO (400 MG) DAILY 2.) INCREASE IBUPROFEN (200 MG) THREE TIMES A DAY FOR TWO WEEKS THAN RESUME BACK TO (200 MG) EVERY 6 HOURS   Your physician has requested that you have en exercise stress myoview DX: 786.50. For further information please visit [email or web address] Please follow instruction sheet, as given.  A chest x-ray takes a picture of the organs and structures inside the chest, including the heart, lungs, and blood vessels. This test can show several things, including, whether the heart is enlarges; whether fluid is building up in the lungs; and whether pacemaker / defibrillator leads are still in place. DX: 786.50 TODAY AT [facility] NUMBER [facility]   Your physician recommends that you schedule a follow-up appointment WITH DR. Elease Hashimoto IN 6 MONTHS

## 2012-08-16 NOTE — Progress Notes (Signed)
[address] [place], [place]  [address] Phone: [phone] Fax:  [phone]  Date:  08/16/2012   Name:  Walter Cross   DOB:  Jan 13, 1951   MRN:  [id number]  PCP:  Redmond Baseman, MD  Primary Cardiologist:  Dr. Delane Ginger  Primary Electrophysiologist:  None    History of Present Illness: Walter Cross is a 61 y.o. male who returns for followup on chest pain.  He has a hx of mild hypertrophic cardiomyopathy, HTN, HL, GERD.  Saw Leda Gauze, PA-C 9/5 with chest pain ongoing for 3 weeks.  ECG was normal. She reviewed the case with Dr. Elease Hashimoto who suggested an echocardiogram.  Echo demonstrated upper septal thickening, no SAM and no LVOT gradient, mild LVH and normal LV function with an EF of 65%.  He has had persistent chest heaviness for the last 4-5 weeks. He denies pleuritic symptoms. He denies symptoms worsening with lying supine. He denies worsening with exertion. He denies associated shortness of breath, radiation, diaphoresis, nausea or syncope. He denies orthopnea, PND or significant pedal edema.  Wt Readings from Last 3 Encounters:  08/16/12 172 lb (78.019 kg)  08/02/12 169 lb (76.658 kg)  02/21/12 171 lb 12.8 oz (77.928 kg)     Past Medical History  Diagnosis Date  . Hypertension   . Hyperlipidemia   . GERD (gastroesophageal reflux disease)   . Benign colon polyp   . Genital HSV   . Dermatitis     axillary  . 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 Vitamin d deficiency     Current Outpatient Prescriptions  Medication Sig Dispense Refill  . acyclovir (ZOVIRAX) 5 % ointment Apply 1 application topically as needed.       Marland Kitchen aspirin 81 MG tablet Take 81 mg by mouth daily.        . carvedilol (COREG) 6.25 MG tablet Take 1 tablet (6.25 mg total) by mouth 2 (two) times daily with a meal.  60 tablet  5  . Cholecalciferol (VITAMIN D PO) Take 4,000 mg by mouth daily.      . cimetidine (TAGAMET) 200 MG tablet Take 200 mg by mouth  2 (two) times daily.        . clonazePAM (KLONOPIN) 0.5 MG tablet Take 0.5 mg by mouth as needed.       . diltiazem (CARDIZEM CD) 180 MG 24 hr capsule take 1 capsule by mouth once daily  90 capsule  3  . ibuprofen (ADVIL,MOTRIN) 200 MG tablet Take 200 mg by mouth every 6 (six) hours as needed.        Marland Kitchen NITROSTAT 0.4 MG SL tablet Place 0.4 mg under the tongue every 5 (five) minutes as needed.       . nystatin-triamcinolone (MYCOLOG) ointment Apply 1 application topically as needed.       . ramipril (ALTACE) 10 MG tablet Take 10 mg by mouth daily.          Allergies: Allergies  Allergen Reactions  . Esomeprazole Magnesium Other (See Comments)    cramps  . Septra (Bactrim) Other (See Comments)    Back ache    History  Substance Use Topics  . Smoking status: Former Games developer  . Smokeless tobacco: Not on file  . Alcohol Use: Yes     ROS:  Please see the history of present illness.   No fevers, chills, cough.  All other systems reviewed and negative.   PHYSICAL  EXAM: VS:  BP 137/91  Pulse 71  Ht 5\' 9"  (1.753 m)  Wt 172 lb (78.019 kg)  BMI 25.40 kg/m2 Well nourished, well developed, in no acute distress HEENT: normal Neck: no JVD Cardiac:  normal S1, S2; RRR; no murmur; no rub  Chest: No tenderness to palpation  Lungs:  clear to auscultation bilaterally, no wheezing, rhonchi or rales Abd: soft, nontender, no hepatomegaly Ext: no edema Skin: warm and dry Neuro:  CNs 2-12 intact, no focal abnormalities noted  EKG:  Sinus rhythm, heart rate 71, normal axis, J-point elevation       ASSESSMENT AND PLAN:  1.  Chest Pain: Symptoms are atypical for ischemia. He has had persistent symptoms for several weeks. He does have some risk factors for CAD. I will go ahead and arrange and ETT-Myoview. If this is low risk, no further cardiac workup. He does have J-point elevation on his ECG. However, he did not have any pleuritic symptoms, symptoms with lying supine. His echocardiogram was normal.  However, I will try him on a trial of NSAIDs for possible pericarditis. He can take ibuprofen 200 mg 3 times a day for 2 weeks. He also has GERD. I've asked him to increase his Tagamet to 400 mg twice a day for 2 weeks. If his stress test is normal, he can followup with Dr. Elease Hashimoto in 6 months.  2. Hypertrophic Cardiomyopathy:  No LVOT gradient by recent echo.  3. Hypertension:  Borderline control.  Continue to monitor.  Signed, Tereso Newcomer, PA-C  11:55 AM 08/16/2012

## 2012-08-23 ENCOUNTER — Ambulatory Visit (HOSPITAL_COMMUNITY): Payer: BC Managed Care – PPO | Attending: Cardiovascular Disease | Admitting: Radiology

## 2012-08-23 VITALS — BP 153/78 | Ht 69.0 in | Wt 166.0 lb

## 2012-08-23 DIAGNOSIS — I10 Essential (primary) hypertension: Secondary | ICD-10-CM | POA: Insufficient documentation

## 2012-08-23 DIAGNOSIS — R079 Chest pain, unspecified: Secondary | ICD-10-CM | POA: Insufficient documentation

## 2012-08-23 DIAGNOSIS — R002 Palpitations: Secondary | ICD-10-CM | POA: Insufficient documentation

## 2012-08-23 DIAGNOSIS — Z87891 Personal history of nicotine dependence: Secondary | ICD-10-CM | POA: Insufficient documentation

## 2012-08-23 IMAGING — NM NM MISC PROCEDURE
1 series · 12 of 12 positions shown · non-contrast
Comparison: none

[Series 1: rest raw · 6.40mm/px · 2 acquisitions, 12 frames shown]
[im 1/2]
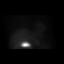
[im 1/2]
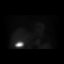
[im 1/2]
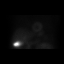
[im 1/2]
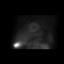
[im 1/2]
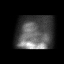
[im 1/2]
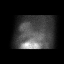
[im 2/2]
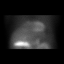
[im 2/2]
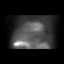
[im 2/2]
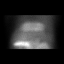
[im 2/2]
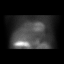
[im 2/2]
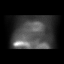
[im 2/2]
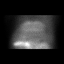

[12 of 12 positions shown; findings below may reference images not displayed]

Canned report from images found in remote index.

Refer to host system for actual result text.

## 2012-08-23 MED ORDER — TECHNETIUM TC 99M SESTAMIBI GENERIC - CARDIOLITE
33.0000 | Freq: Once | INTRAVENOUS | Status: AC | PRN
  Administered 2012-08-23: 33 via INTRAVENOUS

## 2012-08-23 MED ORDER — TECHNETIUM TC 99M SESTAMIBI GENERIC - CARDIOLITE
11.0000 | Freq: Once | INTRAVENOUS | Status: AC | PRN
  Administered 2012-08-23: 11 via INTRAVENOUS

## 2012-08-23 NOTE — Progress Notes (Addendum)
[facility] SITE 3 NUCLEAR MED [address] [place] [address] [phone]  Cardiology Nuclear Med Study  Walter Cross is a 61 y.o. male     MRN : [id number]     DOB: Dec 07, 1950  Procedure Date: 08/23/2012  Nuclear Med Background Indication for Stress Test:  Evaluation for Ischemia History:  No prior Hx of CAD Cardiomyopathy, 08/08/12 ECHO: EF: 65% mild LVH  Cardiac Risk Factors: History of Smoking, Hypertension and Lipids  Symptoms:  Chest Pain and Palpitations   Nuclear Pre-Procedure Caffeine/Decaff Intake:  None NPO After: 9:00pm   Lungs:  clear O2 Sat: 98% on room air. IV 0.9% NS with Angio Cath:  20g  IV Site: R Forearm  IV Started by:  Stanton Kidney, EMT-P  Chest Size (in):  42 Cup Size: n/a  Height: 5\' 9"  (1.753 m)  Weight:  166 lb (75.297 kg)  BMI:  Body mass index is 24.51 kg/(m^2). Tech Comments:  Coreg held > 24 hours, per patient.    Nuclear Med Study 1 or 2 day study: 1 day  Stress Test Type:  Stress  Reading MD: Charlton Haws, MD  Order Authorizing Provider:  P.Nahser MD S.Weaver PA  Resting Radionuclide: Technetium 30m Sestamibi  Resting Radionuclide Dose: 11.0 mCi   Stress Radionuclide:  Technetium 26m Sestamibi  Stress Radionuclide Dose: 33.0 mCi           Stress Protocol Rest HR: 62 Stress HR: 141  Rest BP: 153/78 Stress BP: 204/82  Exercise Time (min): 7:15 METS: 8.90   Predicted Max HR: 158 bpm % Max HR: 89.24 bpm Rate Pressure Product: 21308   Dose of Adenosine (mg):  n/a Dose of Lexiscan: n/a mg  Dose of Atropine (mg): n/a Dose of Dobutamine: n/a mcg/kg/min (at max HR)  Stress Test Technologist: Milana Na, EMT-P  Nuclear Technologist:  Domenic Polite, CNMT     Rest Procedure:  Myocardial perfusion imaging was performed at rest 45 minutes following the intravenous administration of Technetium 62m Tetrofosmin. Rest ECG: NSR - Normal EKG  Stress Procedure:  The patient performed treadmill exercise using a Bruce   Protocol for 7:15 minutes. The patient stopped due to fatigue, sob, and denied any chest pain.  There were no significant ST-T wave changes.  Technetium 89m Sestamibi was injected at peak exercise and myocardial perfusion imaging was performed after a brief delay. Stress ECG: No significant change from baseline ECG  QPS Raw Data Images:  Normal; no motion artifact; normal heart/lung ratio. Stress Images:  Normal homogeneous uptake in all areas of the myocardium. Rest Images:  Normal homogeneous uptake in all areas of the myocardium. Subtraction (SDS):  Normal Transient Ischemic Dilatation (Normal <1.22):  0.84 Lung/Heart Ratio (Normal <0.45):  0.31  Quantitative Gated Spect Images QGS EDV:  73 ml QGS ESV:  14 ml  Impression Exercise Capacity:  Fair  BP Response:  Normal blood pressure response. Clinical Symptoms:  There is dyspnea. ECG Impression:  No significant ST segment change suggestive of ischemia. Comparison with Prior Nuclear Study: No images to compare  Overall Impression:  Normal stress nuclear study.  LV Ejection Fraction: 81%.  LV Wall Motion:  NL LV Function; NL Wall Motion   Charlton Haws

## 2012-08-24 ENCOUNTER — Encounter: Payer: Self-pay | Admitting: Physician Assistant

## 2012-08-24 ENCOUNTER — Telehealth: Payer: Self-pay | Admitting: *Deleted

## 2012-08-24 NOTE — Telephone Encounter (Signed)
pt notifed about myoview results w/verbal understanding today

## 2012-08-24 NOTE — Telephone Encounter (Signed)
Message copied by Tarri Fuller on Fri Aug 24, 2012 10:56 AM ------      Message from: [place], [place] T      Created: Fri Aug 24, 2012 10:48 AM       Please inform patient stress test normal.      Tereso Newcomer, PA-C  10:48 AM 08/24/2012

## 2012-09-02 ENCOUNTER — Other Ambulatory Visit: Payer: Self-pay | Admitting: Cardiovascular Disease

## 2012-09-03 NOTE — Telephone Encounter (Signed)
Fax Received. Refill Completed. Walter Cross (R.M.A)   

## 2012-09-21 ENCOUNTER — Ambulatory Visit (INDEPENDENT_AMBULATORY_CARE_PROVIDER_SITE_OTHER): Payer: BC Managed Care – PPO | Admitting: Internal Medicine

## 2012-09-21 ENCOUNTER — Encounter: Payer: Self-pay | Admitting: Internal Medicine

## 2012-09-21 VITALS — BP 122/88 | HR 64 | Temp 98.6°F | Ht 69.0 in | Wt 174.0 lb

## 2012-09-21 DIAGNOSIS — R0789 Other chest pain: Secondary | ICD-10-CM

## 2012-09-21 NOTE — Progress Notes (Signed)
Subjective:    Patient ID: Walter Cross, male    DOB: 1951/07/15, 61 y.o.   MRN: [id number]  HPI PCP is Redmond Baseman, MD     Body mass index is 25.70 kg/(m^2). 09/21/2012  reports that he quit smoking about 36 years ago. His smoking use included Cigarettes. He has a 7 pack-year smoking history. He does not have any smokeless tobacco history on file.  CARDS NOTE Sept 2013 He has a hx of mild hypertrophic cardiomyopathy, HTN, HL, GERD. Saw Leda Gauze, PA-C 9/5 with chest pain ongoing for 3 weeks. ECG was normal. She reviewed the case with Dr. Elease Hashimoto who suggested an echocardiogram. Echo demonstrated upper septal thickening, no SAM and no LVOT gradient, mild LVH and normal LV function with an EF of 65%.  He has had persistent chest heaviness for the last 4-5 weeks. He denies pleuritic symptoms. He denies symptoms worsening with lying supine. He denies worsening with exertion. He denies associated shortness of breath, radiation, diaphoresis, nausea or syncope. He denies orthopnea, PND or significant pedal edema.  1. Chest Pain: Symptoms are atypical for ischemia. He has had persistent symptoms for several weeks. He does have some risk factors for CAD. I will go ahead and arrange and ETT-Myoview. If this is low risk, no further cardiac workup. He does have J-point elevation on his ECG. However, he did not have any pleuritic symptoms, symptoms with lying supine. His echocardiogram was normal. However, I will try him on a trial of NSAIDs for possible pericarditis. He can take ibuprofen 200 mg 3 times a day for 2 weeks. He also has GERD. I've asked him to increase his Tagamet to 400 mg twice a day for 2 weeks. If his stress test is normal, he can followup with Dr. Elease Hashimoto in 6 months.  2. Hypertrophic Cardiomyopathy: No LVOT gradient by recent echo.  3. Hypertension: Borderline control. Continue to monitor.   Nuc Med StudyOverall Impression: Normal stress nuclear study.  LV Ejection Fraction:  81%. LV Wall Motion: NL LV Function; NL Wall Motion    CXR SEpt 2013  - normal  IOV 09/21/2012 - PULMONARY    Reports new onset chest heaviness x 2 months. Developed at onset within a few days. Constant 80% of the time. STable since onset Not aggravated by exertion or inspiration. No relieving factors Describes as heavines that is dull and sternal and parasternal area in lower chest. DEnies associated radation. 3-5 days prior to onset there was a roof leak in his personal small metal storage room building without ventilation. It had lot of mold in it. He was stirring lot of boxes for short beach travel. He was in it for 20-30 minutes. He is a Scientist, product/process development with telephone systems: denies heavy object work or exposure to metal dust.   There is associated arthralgia - very mild, occasional in the knee, right shoulder but this is all old and unchanged.  He does have GERD and is on tagamet that has it control. He is due to see [facility] Dr Ronney Asters for the same problem. No associated dyspnea, cough, wheezing, fever, weight loss, orthopnea, paroxysmal nocturnal dyspnea, dyspnea, edema, immobilization, prolonged travel,  hemoptysis     Review of Systems  Constitutional: Negative for fever and unexpected weight change.  HENT: Negative for ear pain, nosebleeds, congestion, sore throat, rhinorrhea, sneezing, trouble swallowing, dental problem, postnasal drip and sinus pressure.   Eyes: Negative for redness and itching.  Respiratory: Negative for cough, chest tightness, shortness of  breath and wheezing.   Cardiovascular: Negative for palpitations and leg swelling.  Gastrointestinal: Negative for nausea and vomiting.  Genitourinary: Negative for dysuria.  Musculoskeletal: Negative for joint swelling.  Skin: Negative for rash.  Neurological: Negative for headaches.  Hematological: Does not bruise/bleed easily.  Psychiatric/Behavioral: Negative for dysphoric mood. The patient is not nervous/anxious.         Objective:   Physical Exam  Nursing note and vitals reviewed. Constitutional: He is oriented to person, place, and time. He appears well-developed and well-nourished. No distress.  HENT:  Head: Normocephalic and atraumatic.  Right Ear: External ear normal.  Left Ear: External ear normal.  Mouth/Throat: Oropharynx is clear and moist. No oropharyngeal exudate.  Eyes: Conjunctivae normal and EOM are normal. Pupils are equal, round, and reactive to light. Right eye exhibits no discharge. Left eye exhibits no discharge. No scleral icterus.  Neck: Normal range of motion. Neck supple. No JVD present. No tracheal deviation present. No thyromegaly present.  Cardiovascular: Normal rate, regular rhythm and intact distal pulses.  Exam reveals no gallop and no friction rub.   No murmur heard. Pulmonary/Chest: Effort normal and breath sounds normal. No respiratory distress. He has no wheezes. He has no rales. He exhibits no tenderness.  Abdominal: Soft. Bowel sounds are normal. He exhibits no distension and no mass. There is no tenderness. There is no rebound and no guarding.  Musculoskeletal: Normal range of motion. He exhibits no edema and no tenderness.  Lymphadenopathy:    He has no cervical adenopathy.  Neurological: He is alert and oriented to person, place, and time. He has normal reflexes. No cranial nerve deficit. Coordination normal.  Skin: Skin is warm and dry. No rash noted. He is not diaphoretic. No erythema. No pallor.  Psychiatric: He has a normal mood and affect. His behavior is normal. Judgment and thought content normal.          Assessment & Plan:

## 2012-09-21 NOTE — Patient Instructions (Addendum)
Unclear why you have chest heaviness Let us start with pft breathing test Once done give [place] a call; I will reivew and get back to you about next step

## 2012-09-23 DIAGNOSIS — R0789 Other chest pain: Secondary | ICD-10-CM | POA: Insufficient documentation

## 2012-09-23 NOTE — Assessment & Plan Note (Signed)
Unclear why you have chest heaviness Let us start with pft breathing test Once done give us a call; I will reivew and get back to you about next step 

## 2012-09-24 ENCOUNTER — Other Ambulatory Visit: Payer: Self-pay | Admitting: Gastroenterology

## 2012-09-24 DIAGNOSIS — R079 Chest pain, unspecified: Secondary | ICD-10-CM

## 2012-09-26 ENCOUNTER — Ambulatory Visit
Admission: RE | Admit: 2012-09-26 | Discharge: 2012-09-26 | Disposition: A | Discharge status: Home / Self Care | Payer: BC Managed Care – PPO | Source: Ambulatory Visit | Attending: Gastroenterology | Admitting: Gastroenterology

## 2012-09-26 DIAGNOSIS — R079 Chest pain, unspecified: Secondary | ICD-10-CM

## 2012-09-26 IMAGING — RF DG ESOPHAGUS
12 of 14 series · 19 of 24 positions shown · non-contrast
Comparison: None.

CLINICAL DATA: Chest pain

ESOPHOGRAM/BARIUM SWALLOW
TECHNIQUE: Combined double contrast and single contrast
examination performed using effervescent crystals, thick barium
liquid, and thin barium liquid.
Fluoroscopy time:  1.9 minutes.

[Series 2: run · 1 of 1 slices shown (1 of 12)]
[im 1/1]
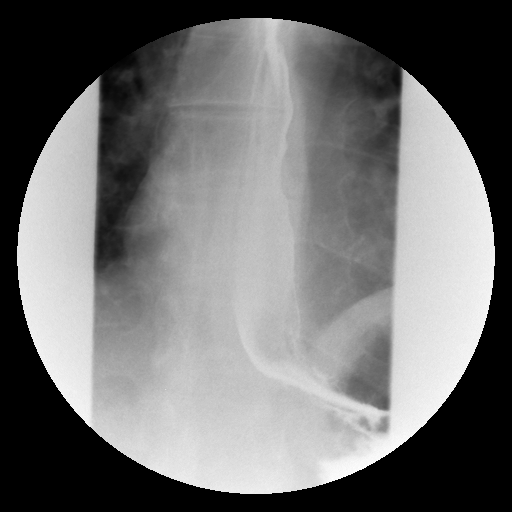

[Series 3: run · 1 of 1 slices shown (2 of 12)]
[im 1/1]
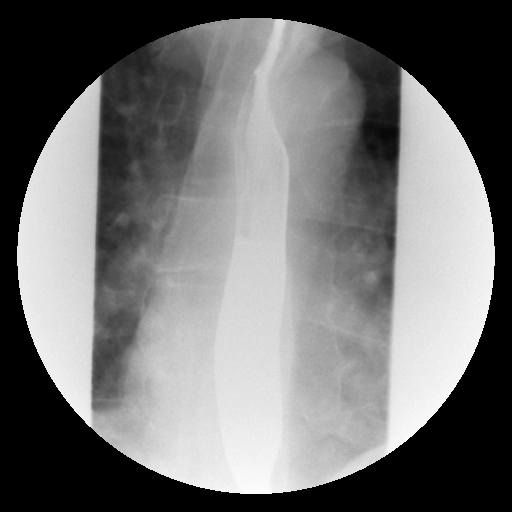

[Series 5: run · 1 of 1 slices shown (3 of 12)]
[im 1/1]
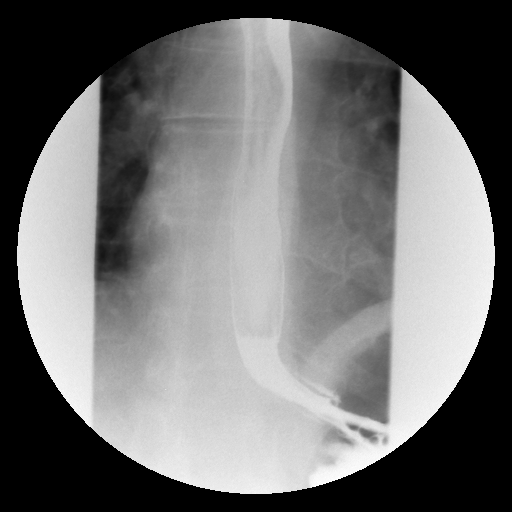

[Series 6: run · 1 of 1 slices shown (4 of 12)]
[im 1/1]
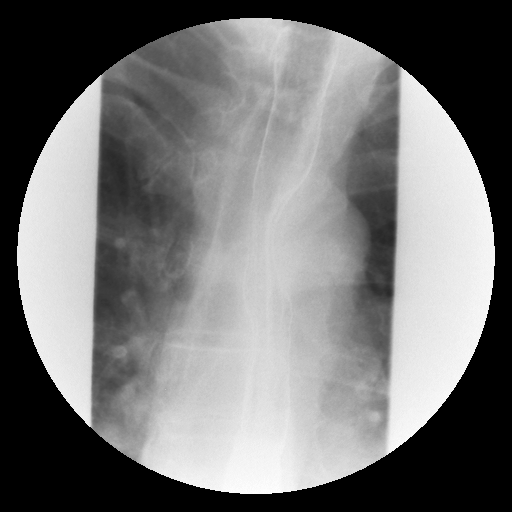

[Series 7: run · 5 of 8 slices shown (5 of 12)]
[im 1/8]
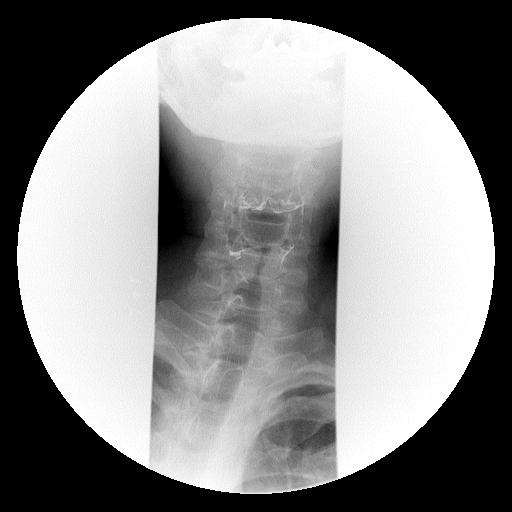
[im 2/8]
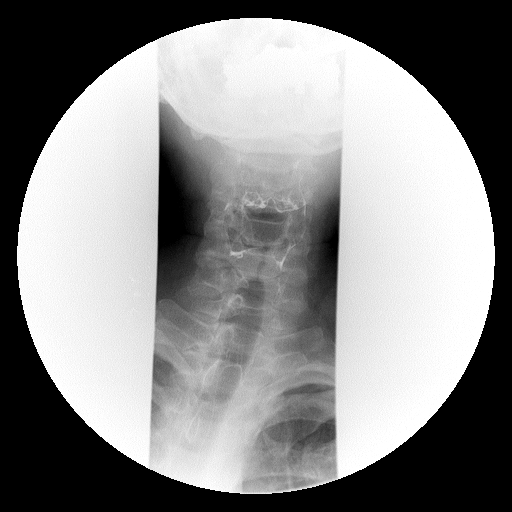
[im 5/8]
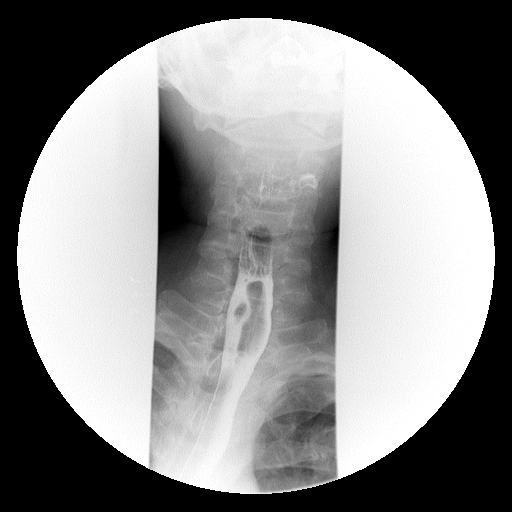
[im 6/8]
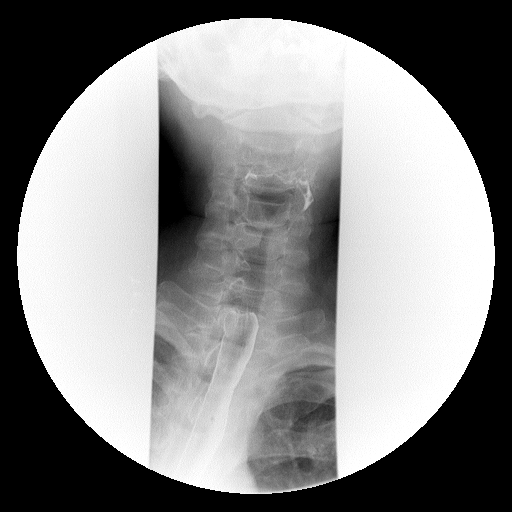
[im 8/8]
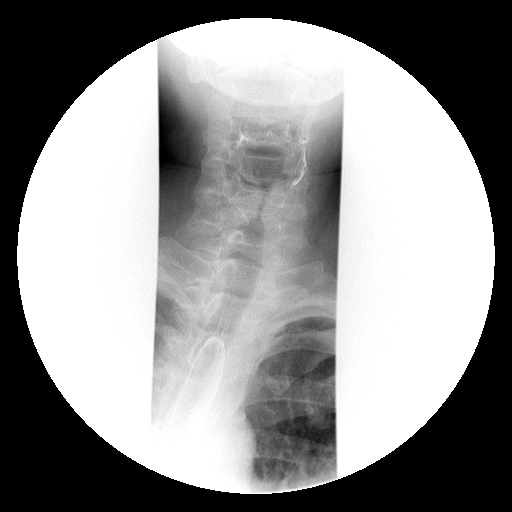

[Series 8: run · 4 of 8 slices shown (6 of 12)]
[im 2/8]
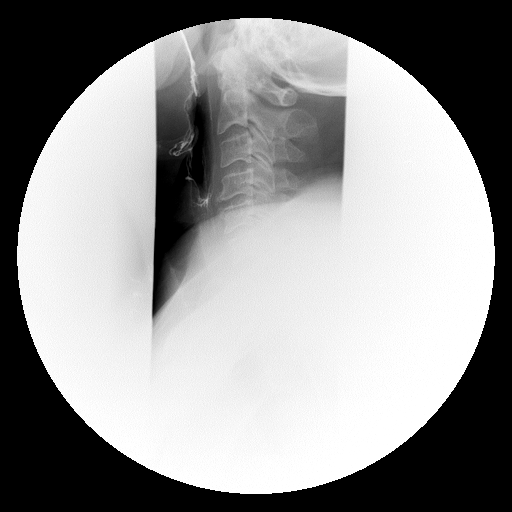
[im 3/8]
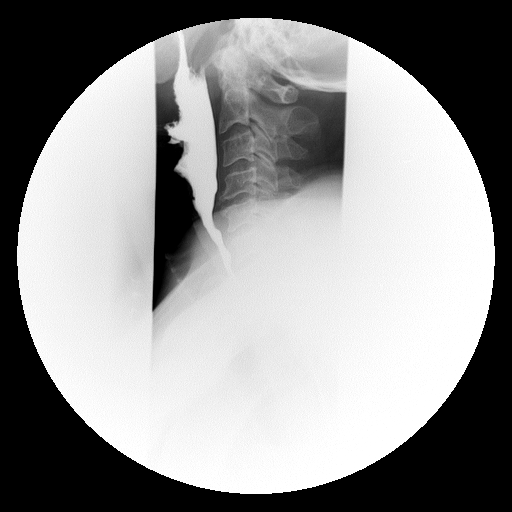
[im 5/8]
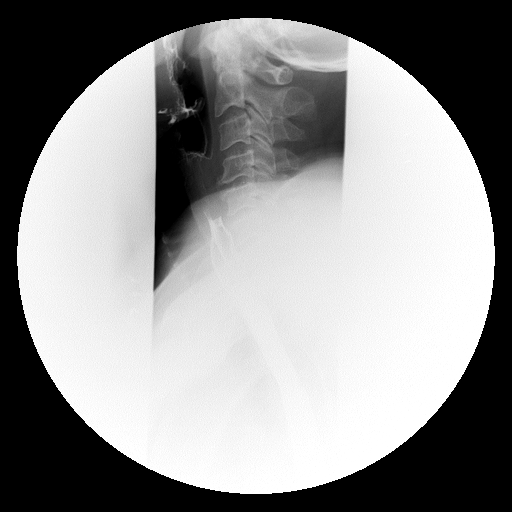
[im 6/8]
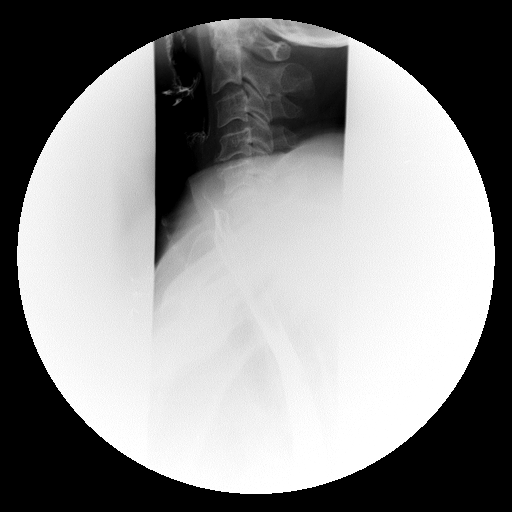

[Series 10: run · 1 of 1 slices shown (7 of 12)]
[im 1/1]
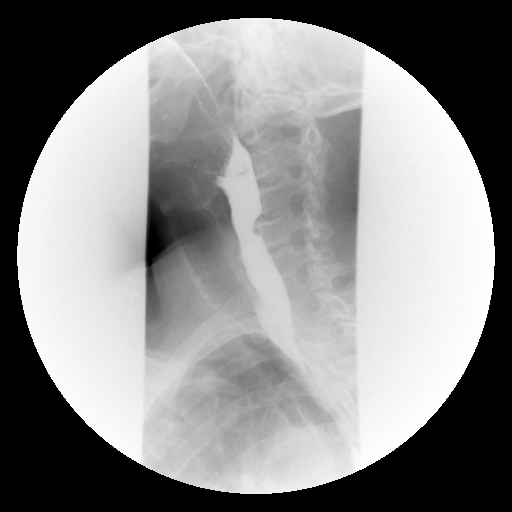

[Series 11: run · 1 of 1 slices shown (8 of 12)]
[im 1/1]
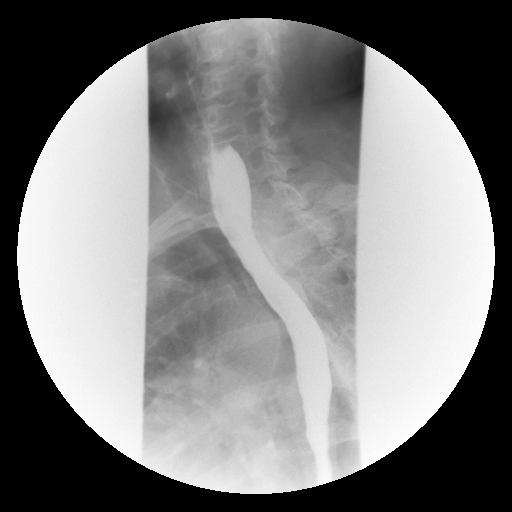

[Series 12: run · 1 of 1 slices shown (9 of 12)]
[im 1/1]
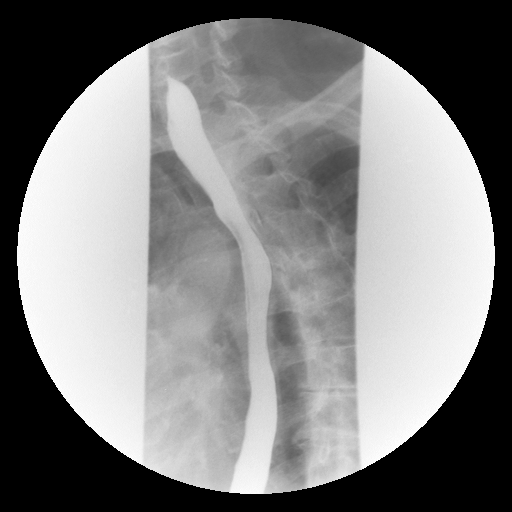

[Series 13: run · 1 of 1 slices shown (10 of 12)]
[im 1/1]
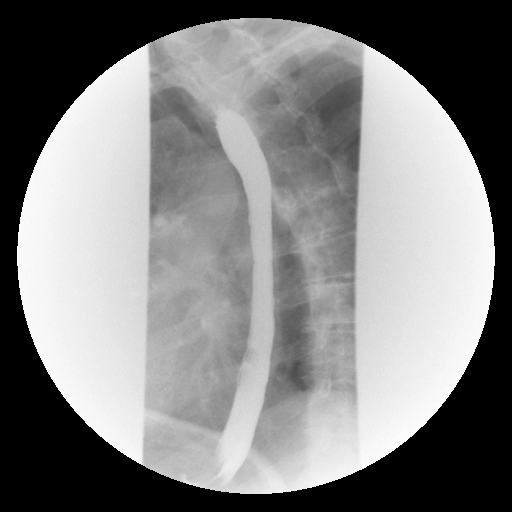

[Series 15: run · 1 of 1 slices shown (11 of 12)]
[im 1/1]
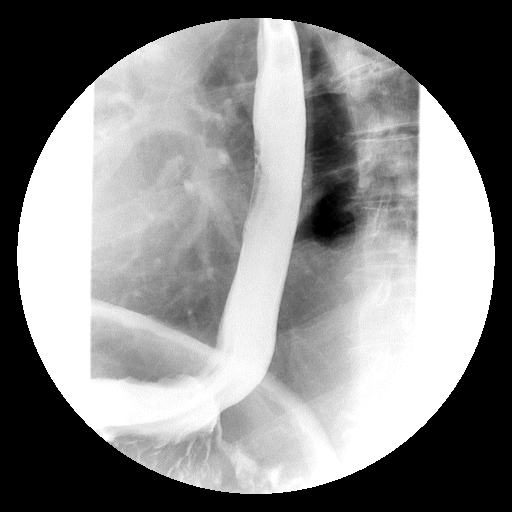

[Series 18: run · 1 of 1 slices shown (12 of 12)]
[im 1/1]
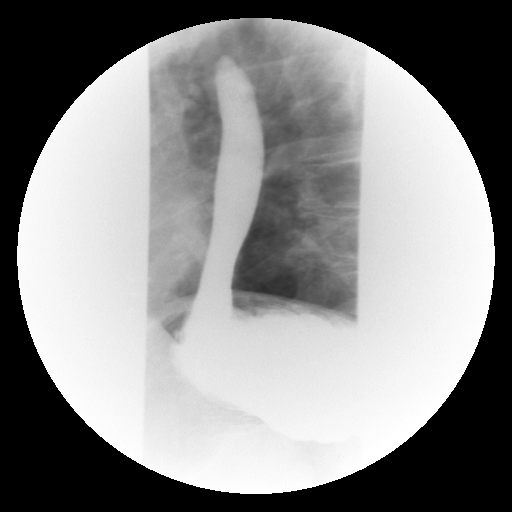

[19 of 24 positions shown; findings below may reference images not displayed]

FINDINGS: A double contrast barium swallow was performed.  The
mucosa of the esophagus is unremarkable.  A single contrast study
shows the swallowing mechanism to be normal.  There are mild
tertiary contractions in the distal esophagus.  There is slight
prominence of the cricopharyngeus muscle noted.  Significant
gastroesophageal reflux is present.  A barium pill was given at the
end of the study which did pass into the stomach without delay.
IMPRESSION: 1.  Significant gastroesophageal reflux.  No evidence of stricture.
2.  Mild tertiary contractions of the distal esophagus.
3.  Slight prominence of the cricopharyngeus muscle.

## 2012-10-15 ENCOUNTER — Ambulatory Visit (INDEPENDENT_AMBULATORY_CARE_PROVIDER_SITE_OTHER): Payer: BC Managed Care – PPO | Admitting: Internal Medicine

## 2012-10-15 DIAGNOSIS — R0789 Other chest pain: Secondary | ICD-10-CM

## 2012-10-15 LAB — PULMONARY FUNCTION TEST

## 2012-10-15 NOTE — Progress Notes (Signed)
PFT done today. 

## 2012-10-16 ENCOUNTER — Encounter: Payer: Self-pay | Admitting: Cardiovascular Disease

## 2012-10-23 ENCOUNTER — Telehealth: Payer: Self-pay | Admitting: Internal Medicine

## 2012-10-23 NOTE — Telephone Encounter (Signed)
pft 10/15/12 is normal including dlco. Need to discuss results in context of his chest heaviness. LMTCB

## 2012-10-24 ENCOUNTER — Other Ambulatory Visit: Payer: Self-pay | Admitting: Family Medicine

## 2012-10-24 ENCOUNTER — Ambulatory Visit
Admission: RE | Admit: 2012-10-24 | Discharge: 2012-10-24 | Disposition: A | Discharge status: Home / Self Care | Payer: BC Managed Care – PPO | Source: Ambulatory Visit | Attending: Family Medicine | Admitting: Family Medicine

## 2012-10-24 DIAGNOSIS — M542 Cervicalgia: Secondary | ICD-10-CM

## 2012-10-31 ENCOUNTER — Other Ambulatory Visit: Payer: Self-pay | Admitting: *Deleted

## 2012-10-31 MED ORDER — DILTIAZEM HCL ER COATED BEADS 180 MG PO CP24: 180.0000 mg | ORAL_CAPSULE | Freq: Every day | ORAL | Status: DC

## 2012-10-31 NOTE — Telephone Encounter (Signed)
Pt needs appointment then refill can be made Fax Received. Refill Completed. Walter Cross (R.M.A)   

## 2012-11-05 NOTE — Telephone Encounter (Signed)
He is 80-90% better after trial of PPI by DR Buccinni. He is going to test out with koch postulates. He will call me for additional testing if symptoms recur; this can include MCT or cpst. No current fu planned

## 2012-11-09 ENCOUNTER — Encounter: Payer: Self-pay | Admitting: Internal Medicine

## 2013-01-31 ENCOUNTER — Telehealth: Payer: Self-pay | Admitting: *Deleted

## 2013-01-31 MED ORDER — DILTIAZEM HCL ER COATED BEADS 180 MG PO CP24: 180.0000 mg | ORAL_CAPSULE | Freq: Every day | ORAL | Status: DC

## 2013-01-31 NOTE — Telephone Encounter (Signed)
Lm for patient to call office to make an appointment to see Dr. Elease Hashimoto. Number provided. Fax Received. Refill Completed. Tammi Boulier Chowoe (R.M.A)

## 2013-02-22 ENCOUNTER — Other Ambulatory Visit: Payer: Self-pay | Admitting: *Deleted

## 2013-02-22 MED ORDER — CARVEDILOL 6.25 MG PO TABS: 6.2500 mg | ORAL_TABLET | Freq: Two times a day (BID) | ORAL | Status: DC

## 2013-02-22 NOTE — Telephone Encounter (Signed)
Fax Received. Refill Completed. Walter Cross (R.M.A)   

## 2013-02-25 ENCOUNTER — Other Ambulatory Visit: Payer: Self-pay | Admitting: *Deleted

## 2013-04-16 ENCOUNTER — Ambulatory Visit (INDEPENDENT_AMBULATORY_CARE_PROVIDER_SITE_OTHER): Payer: BC Managed Care – PPO | Admitting: *Deleted

## 2013-04-16 DIAGNOSIS — R079 Chest pain, unspecified: Secondary | ICD-10-CM

## 2013-04-16 DIAGNOSIS — R0789 Other chest pain: Secondary | ICD-10-CM

## 2013-04-16 LAB — BASIC METABOLIC PANEL
BUN: 12 mg/dL (ref 6–23)
CO2: 27 mEq/L (ref 19–32)
Calcium: 9 mg/dL (ref 8.4–10.5)
Chloride: 105 mEq/L (ref 96–112)
Creatinine, Ser: 0.9 mg/dL (ref 0.4–1.5)
GFR: 96.72 mL/min (ref >60.00)
Glucose, Bld: 116 mg/dL — ABNORMAL HIGH (ref 70–99)
Potassium: 4.4 mEq/L (ref 3.5–5.1)
Sodium: 141 mEq/L (ref 135–145)

## 2013-04-16 LAB — HEPATIC FUNCTION PANEL
ALT: 37 U/L (ref 0–53)
AST: 33 U/L (ref 0–37)
Albumin: 3.9 g/dL (ref 3.5–5.2)
Alkaline Phosphatase: 65 U/L (ref 39–117)
Bilirubin, Direct: 0.1 mg/dL (ref 0.0–0.3)
Total Bilirubin: 0.5 mg/dL (ref 0.3–1.2)
Total Protein: 7 g/dL (ref 6.0–8.3)

## 2013-04-16 LAB — LIPID PANEL
Cholesterol: 211 mg/dL — ABNORMAL HIGH (ref 0–200)
HDL: 76.9 mg/dL (ref >39.00)
Total CHOL/HDL Ratio: 3
Triglycerides: 67 mg/dL (ref 0.0–149.0)
VLDL: 13.4 mg/dL (ref 0.0–40.0)

## 2013-04-16 LAB — LDL CHOLESTEROL, DIRECT: Direct LDL: 115.6 mg/dL

## 2013-04-17 ENCOUNTER — Ambulatory Visit (INDEPENDENT_AMBULATORY_CARE_PROVIDER_SITE_OTHER): Payer: BC Managed Care – PPO | Admitting: Family Medicine

## 2013-04-17 ENCOUNTER — Encounter: Payer: Self-pay | Admitting: Family Medicine

## 2013-04-17 VITALS — BP 124/80 | HR 60 | Temp 98.0°F | Resp 18 | Ht 69.0 in | Wt 174.0 lb

## 2013-04-17 DIAGNOSIS — M25579 Pain in unspecified ankle and joints of unspecified foot: Secondary | ICD-10-CM

## 2013-04-17 DIAGNOSIS — G8929 Other chronic pain: Secondary | ICD-10-CM

## 2013-04-17 MED ORDER — MELOXICAM 15 MG PO TABS: 15.0000 mg | ORAL_TABLET | Freq: Every day | ORAL | Status: DC

## 2013-04-17 NOTE — Progress Notes (Signed)
Subjective:    Patient ID: Walter Cross, male    DOB: 22-Dec-1950, 62 y.o.   MRN: [id number]  HPI  Patient presents with several weeks of worsening pain in his right heel. It is located on the plantar aspect of his calcaneus.  He denies any injury. It hurts with prolonged standing and walking. It is dull constant ache which occasionally comes a sharp pain. There is no erythema or swelling around the heel. Past Medical History  Diagnosis Date  . Hypertension   . Hyperlipidemia   . GERD (gastroesophageal reflux disease)   . Benign colon polyp   . Genital HSV   . Dermatitis     axillary  . 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 Vitamin D deficiency   . Hypertrophic cardiomyopathy     a. Echo 2007: mod LVH, diast dysfn, mild MR, no LVOT obstruction;  b. echo 9/13: upper septal thickening, no SAM of MV, no LVOT gradient, mild LVH, EF 65%  . Hx of cardiovascular stress test     ETT-Myoview 9/13: no ischemia, EF 81%   Current Outpatient Prescriptions on File Prior to Visit  Medication Sig Dispense Refill  . acyclovir (ZOVIRAX) 5 % ointment Apply 1 application topically as needed.       Marland Kitchen aspirin 81 MG tablet Take 81 mg by mouth daily.        . carvedilol (COREG) 6.25 MG tablet Take 1 tablet (6.25 mg total) by mouth 2 (two) times daily with a meal.  60 tablet  2  . Cholecalciferol (VITAMIN D PO) Take 4,000 mg by mouth daily.      . cimetidine (TAGAMET) 200 MG tablet Take 2 tablets (400 mg total) by mouth 2 (two) times daily.  60 tablet  9  . clonazePAM (KLONOPIN) 0.5 MG tablet Take 0.5 mg by mouth as needed.       . diltiazem (CARDIZEM CD) 180 MG 24 hr capsule Take 1 capsule (180 mg total) by mouth daily.  90 capsule  0  . ibuprofen (ADVIL,MOTRIN) 200 MG tablet Take 1 tablet (200 mg total) by mouth every 4 (four) hours.  30 tablet  9  . NITROSTAT 0.4 MG SL tablet Place 0.4 mg under the tongue every 5 (five) minutes as needed.       . nystatin-triamcinolone (MYCOLOG) ointment  Apply 1 application topically as needed.       . ramipril (ALTACE) 10 MG tablet Take 10 mg by mouth daily.         No current facility-administered medications on file prior to visit.   Allergies  Allergen Reactions  . Esomeprazole Magnesium Other (See Comments)    cramps  . Septra (Bactrim) Other (See Comments)    Back ache     Review of Systems  All other systems reviewed and are negative.       Objective:   Physical Exam  Vitals reviewed. Cardiovascular: Normal rate and regular rhythm.   Pulmonary/Chest: Effort normal and breath sounds normal.   Patient is tender to palpation in his right calcaneus on the plantar aspect of the heel. There is no erythema or swelling. There is no palpable deformity. The patient has extremely high longitudinal arches.  He has normal sensation in the feet as well as normal pulses       Assessment & Plan:  1. Chronic heel pain, right I suspect this is a combination of plantar fasciitis and possibly bone spurs on his calcaneus.  Recommend an x-ray of the foot characterizes anatomy. Start with meloxicam 15 mg by mouth daily and I gave the patient a home stretching program to target his plantar fascia. He is to recheck in one month. He is not improved with the stretches and anti-inflammatories, I would recommend a podiatry consult for orthotics.  Patient declines a cortisone injection today. - DG Foot Complete Right; Future

## 2013-04-19 ENCOUNTER — Ambulatory Visit
Admission: RE | Admit: 2013-04-19 | Discharge: 2013-04-19 | Disposition: A | Discharge status: Home / Self Care | Payer: BC Managed Care – PPO | Source: Ambulatory Visit | Attending: Family Medicine | Admitting: Family Medicine

## 2013-04-19 DIAGNOSIS — G8929 Other chronic pain: Secondary | ICD-10-CM

## 2013-04-19 IMAGING — CR DG FOOT COMPLETE 3+V*R*
3 series · 3 of 3 positions shown · non-contrast
Comparison: None.

CLINICAL DATA: Pain particularly in the heel for 2 weeks, no trauma

RIGHT FOOT COMPLETE - 3+ VIEW

[view not recorded (1 of 3)]
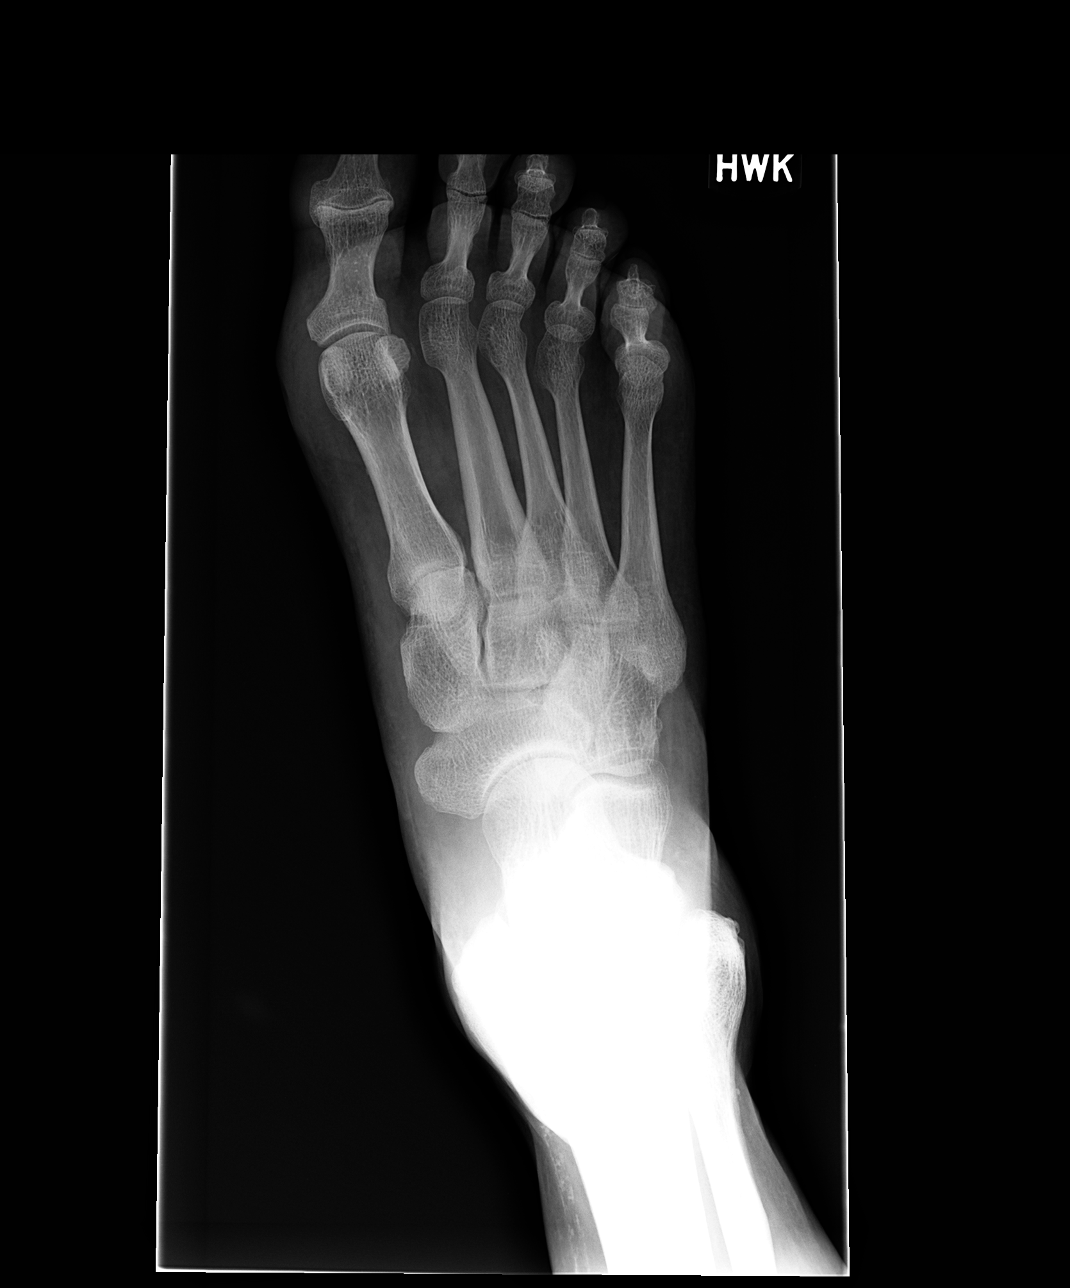

[view not recorded (2 of 3)]
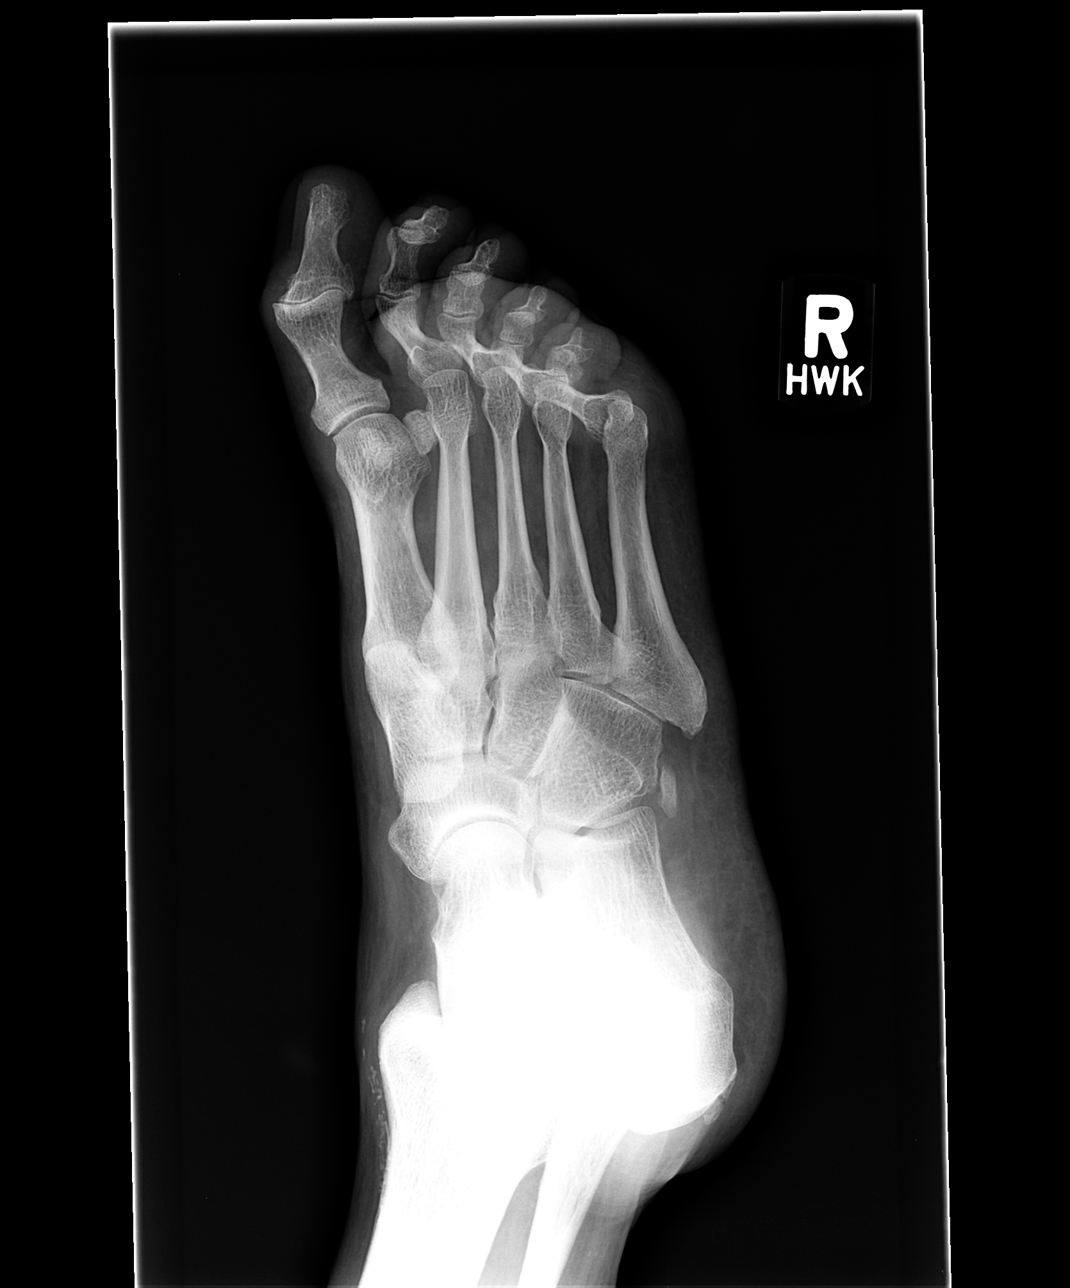

[view not recorded (3 of 3)]
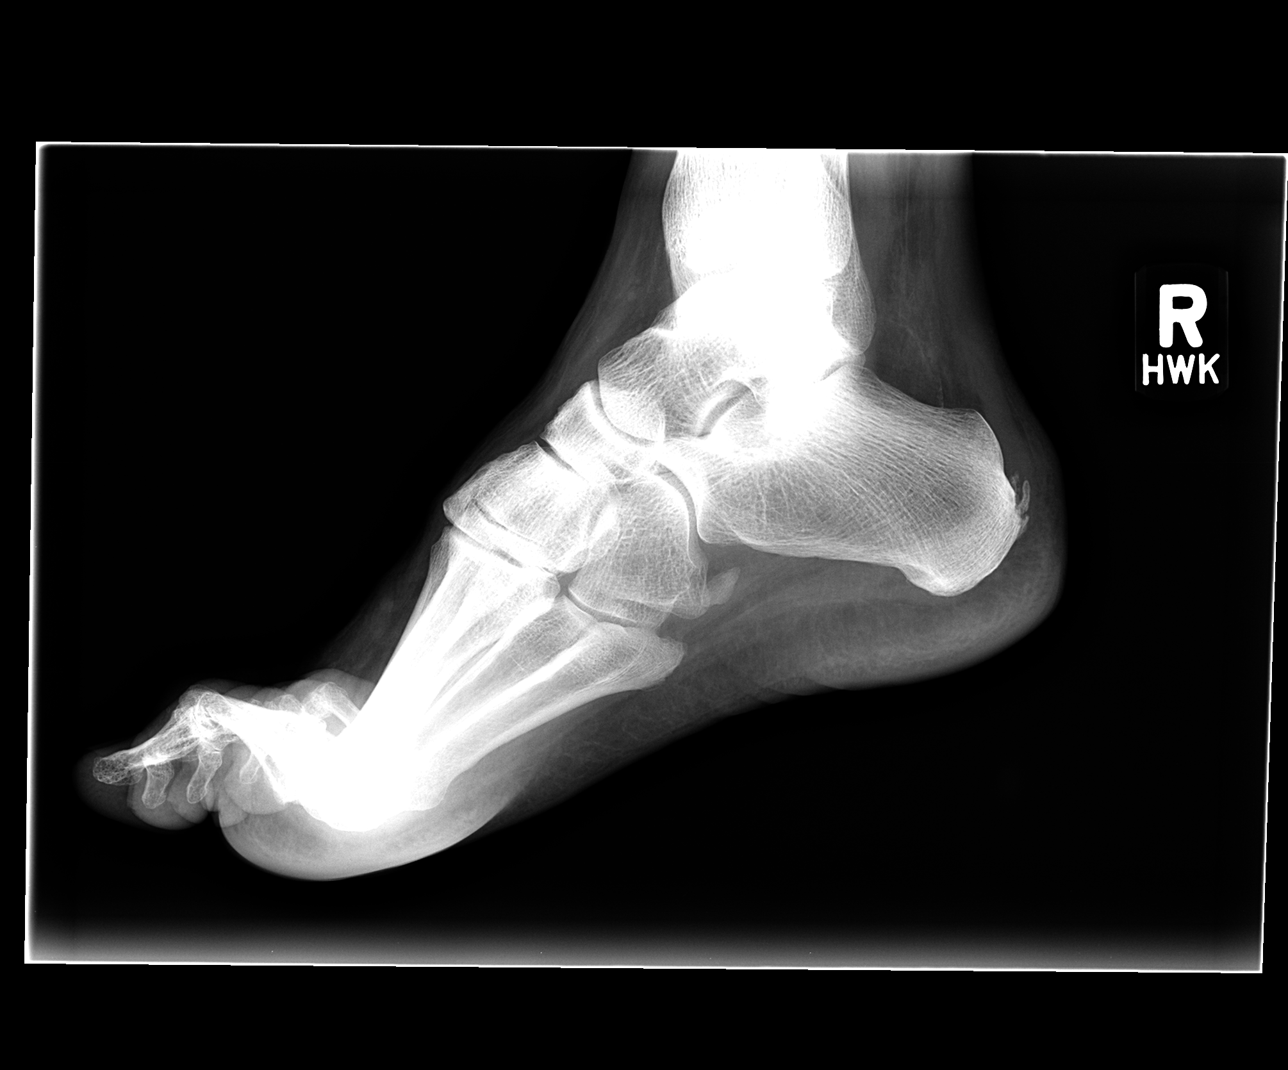

[3 of 3 positions shown; findings below may reference images not displayed]

FINDINGS: Tarsal - metatarsal alignment is normal.  The joint
spaces are relatively well preserved with only mild degenerative
change of the right first DIP joint.  No erosion is seen.  Minimal
spurring is present from the insertion of the Achilles tendon on
the posterior aspect of the calcaneus.
IMPRESSION: 1.  Mild degenerative change as described above.
2.  No acute abnormality.

## 2013-04-23 ENCOUNTER — Ambulatory Visit (INDEPENDENT_AMBULATORY_CARE_PROVIDER_SITE_OTHER): Payer: BC Managed Care – PPO | Admitting: Cardiovascular Disease

## 2013-04-23 ENCOUNTER — Encounter: Payer: Self-pay | Admitting: Cardiovascular Disease

## 2013-04-23 VITALS — BP 134/76 | HR 83 | Ht 69.0 in | Wt 172.0 lb

## 2013-04-23 DIAGNOSIS — I421 Obstructive hypertrophic cardiomyopathy: Secondary | ICD-10-CM

## 2013-04-23 DIAGNOSIS — R079 Chest pain, unspecified: Secondary | ICD-10-CM

## 2013-04-23 NOTE — Progress Notes (Addendum)
Rosalyn Charters Date of Birth  [birth year] [facility] [address]    [address] [place], [place]  [address] [phone]  Fax  [phone]  History of Present Illness:  62 year old gentleman with a history of a mild hypertrophic obstructive cardiomyopathy. He is done very well on medical therapy. He's been on carvedilol and Cardizem. He does not have any episodes of chest pain or shortness breath. He exercises occasionally.  He's looking forward to getting up and walking work this summer and spring.  Apr 23, 2013:  Rutger presents with some episodes of chest pain last year. He had a stress Myoview study which was normal. He's had a recent echocardiogram which did not reveal any LVOT gradient  [name] placed him on an acid blocker and he is feeling well.   Current Outpatient Prescriptions on File Prior to Visit  Medication Sig Dispense Refill  . acyclovir (ZOVIRAX) 5 % ointment Apply 1 application topically as needed.       Marland Kitchen aspirin 81 MG tablet Take 81 mg by mouth daily.        . carvedilol (COREG) 6.25 MG tablet Take 1 tablet (6.25 mg total) by mouth 2 (two) times daily with a meal.  60 tablet  2  . Cholecalciferol (VITAMIN D PO) Take 4,000 mg by mouth daily.      . clonazePAM (KLONOPIN) 0.5 MG tablet Take 0.5 mg by mouth as needed.       . diltiazem (CARDIZEM CD) 180 MG 24 hr capsule Take 1 capsule (180 mg total) by mouth daily.  90 capsule  0  . ibuprofen (ADVIL,MOTRIN) 200 MG tablet Take 1 tablet (200 mg total) by mouth every 4 (four) hours.  30 tablet  9  . meloxicam (MOBIC) 15 MG tablet Take 1 tablet (15 mg total) by mouth daily.  30 tablet  0  . NITROSTAT 0.4 MG SL tablet Place 0.4 mg under the tongue every 5 (five) minutes as needed.       . nystatin-triamcinolone (MYCOLOG) ointment Apply 1 application topically as needed.       . pantoprazole (PROTONIX) 40 MG tablet Take 40 mg by mouth daily.      . ramipril (ALTACE) 10 MG tablet Take 10 mg by mouth daily.          No current facility-administered medications on file prior to visit.    Allergies  Allergen Reactions  . Esomeprazole Magnesium Other (See Comments)    cramps  . Septra (Bactrim) Other (See Comments)    Back ache    Past Medical History  Diagnosis Date  . Hypertension   . Hyperlipidemia   . GERD (gastroesophageal reflux disease)   . Benign colon polyp   . Genital HSV   . Dermatitis     axillary  . 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 Vitamin D deficiency   . Hypertrophic cardiomyopathy     a. Echo 2007: mod LVH, diast dysfn, mild MR, no LVOT obstruction;  b. echo 9/13: upper septal thickening, no SAM of MV, no LVOT gradient, mild LVH, EF 65%  . Hx of cardiovascular stress test     ETT-Myoview 9/13: no ischemia, EF 81%    Past Surgical History  Procedure Laterality Date  . Cervical spine surgery  1992-1993    nerve release, Dr Gerlene Fee  . Spine surgery      L4-L5, 1992-1993    History  Smoking status  . Former Smoker --  1.00 packs/day for 7 years  . Types: Cigarettes  . Quit date: 11/29/1975  Smokeless tobacco  . Not on file    History  Alcohol Use  . 1.0 oz/week  . 2 drink(s) per week    Family History  Problem Relation Age of Onset  . Cancer Mother 59    cervical  . Cancer Father     lung    Reviw of Systems:  Reviewed in the HPI.  All other systems are negative.  Physical Exam: BP 134/76  Pulse 83  Ht 5\' 9"  (1.753 m)  Wt 172 lb (78.019 kg)  BMI 25.39 kg/m2 The patient is alert and oriented x 3.  The mood and affect are normal.   Skin: warm and dry.  Color is normal.    HEENT:   the sclera are nonicteric.  The mucous membranes are moist.  The carotids are 2+ without bruits.  There is no thyromegaly.  There is no JVD.    Lungs: clear.  The chest wall is non tender.    Heart: regular rate with a normal S1 and S2.  There is a 1-2 / 6 systolic murmur at the left sternal border.  Abdomen: good bowel sounds.  There is no guarding  or rebound.  There is no hepatosplenomegaly or tenderness.  There are no masses.   Extremities:  no clubbing, cyanosis, or edema.  The legs are without rashes.  The distal pulses are intact.   Neuro:  Cranial nerves II - XII are intact.  Motor and sensory functions are intact.    The gait is normal.  EKG: May, 27, 2014: Normal sinus rhythm at 83. EKG is normal.  Assessment / Plan:

## 2013-04-23 NOTE — Assessment & Plan Note (Signed)
Walter Cross is having some unusual sensations that he thinks is due to the diltiazem. He's having some unusual dreams.  We will have him hold his diltiazem and continue his carvedilol for 2-3 weeks. If this seems to solve his problem then we will continue with Coreg low. We may need to double the dose but we will have him measure his heart rate and blood pressure to see what the appropriate dose of coreg would be.  On the other hand if this does not seem to solve that issue and we will have him resume his diltiazem and hold the Coreg to see if that solves the problem.  I'll see him again in 6 months for further assessment of this issue. He'll call me sooner if he needs further advice.

## 2013-04-23 NOTE — Patient Instructions (Addendum)
Your physician wants you to follow-up in: 6 months You will receive a reminder letter in the mail two months in advance. If you don't receive a letter, please call our office to schedule the follow-up appointment.  Try holding diltiazem/ then try  carvedilol to see if it resolves odd feeling your having.  Let us know if one of the meds is the cause,

## 2013-05-20 ENCOUNTER — Other Ambulatory Visit: Payer: Self-pay | Admitting: *Deleted

## 2013-05-20 MED ORDER — DILTIAZEM HCL ER COATED BEADS 180 MG PO CP24: 180.0000 mg | ORAL_CAPSULE | Freq: Every day | ORAL | Status: DC

## 2013-05-22 ENCOUNTER — Telehealth: Payer: Self-pay | Admitting: Family Medicine

## 2013-05-22 MED ORDER — RAMIPRIL 10 MG PO TABS: 10.0000 mg | ORAL_TABLET | Freq: Every day | ORAL | Status: DC

## 2013-05-22 NOTE — Telephone Encounter (Signed)
Rx Refilled  

## 2013-05-23 ENCOUNTER — Other Ambulatory Visit: Payer: Self-pay | Admitting: *Deleted

## 2013-05-23 MED ORDER — CARVEDILOL 6.25 MG PO TABS: 6.2500 mg | ORAL_TABLET | Freq: Two times a day (BID) | ORAL | Status: DC

## 2013-05-23 NOTE — Telephone Encounter (Signed)
Spoke to pt and he states he is taking both diltiazem and coreg. Fax Received. Refill Completed. Halimah Bewick Chowoe (R.M.A)

## 2013-07-04 ENCOUNTER — Encounter: Payer: Self-pay | Admitting: Physician Assistant

## 2013-07-04 ENCOUNTER — Ambulatory Visit (INDEPENDENT_AMBULATORY_CARE_PROVIDER_SITE_OTHER): Payer: BC Managed Care – PPO | Admitting: Physician Assistant

## 2013-07-04 VITALS — BP 152/92 | HR 68 | Temp 97.7°F | Resp 18 | Ht 69.25 in | Wt 171.0 lb

## 2013-07-04 DIAGNOSIS — G4700 Insomnia, unspecified: Secondary | ICD-10-CM

## 2013-07-04 MED ORDER — CLONAZEPAM 0.5 MG PO TABS: 0.5000 mg | ORAL_TABLET | Freq: Three times a day (TID) | ORAL | Status: DC | PRN

## 2013-07-04 NOTE — Progress Notes (Signed)
Patient ID: SAMI FROH MRN: [id number], DOB: [birth year], 62 y.o. Date of Encounter: 07/04/2013, 4:51 PM    Chief Complaint:  Chief Complaint  Patient presents with  . med refill    clonazepam     HPI: 62 y.o. year old white male is here for refill on clonazepam. I prescribed this to him in 06/2012 when he was experiencing anxiety/depression after wife's death. He reports that he found that if he takes 1/2 of this at bedtime, it works perfectly. O/W, he often has difficulty sleeping. Says it is only a problem whn he has to work the next day and needs good night's sleep. He is semi-retired. He only takes this night before he works-only about 3 nights a week and only takes 1/2 pill.  He still has bottle given 06/2012-has one pill remaining!!! He has no other complaints.   Home Meds: See attached medication section for any medications that were entered at today's visit. The computer does not put those onto this list.The following list is a list of meds entered prior to today's visit.   Current Outpatient Prescriptions on File Prior to Visit  Medication Sig Dispense Refill  . acyclovir (ZOVIRAX) 5 % ointment Apply 1 application topically as needed.       Marland Kitchen aspirin 81 MG tablet Take 81 mg by mouth daily.        . carvedilol (COREG) 6.25 MG tablet Take 1 tablet (6.25 mg total) by mouth 2 (two) times daily with a meal.  60 tablet  5  . Cholecalciferol (VITAMIN D PO) Take 4,000 mg by mouth daily.      Marland Kitchen diltiazem (CARDIZEM CD) 180 MG 24 hr capsule Take 1 capsule (180 mg total) by mouth daily.  90 capsule  1  . ibuprofen (ADVIL,MOTRIN) 200 MG tablet Take 1 tablet (200 mg total) by mouth every 4 (four) hours.  30 tablet  9  . NITROSTAT 0.4 MG SL tablet Place 0.4 mg under the tongue every 5 (five) minutes as needed.       . nystatin-triamcinolone (MYCOLOG) ointment Apply 1 application topically as needed.       . pantoprazole (PROTONIX) 40 MG tablet Take 40 mg by mouth daily.      . ramipril  (ALTACE) 10 MG tablet Take 1 tablet (10 mg total) by mouth daily.  30 tablet  3  . meloxicam (MOBIC) 15 MG tablet Take 1 tablet (15 mg total) by mouth daily.  30 tablet  0   No current facility-administered medications on file prior to visit.    Allergies:  Allergies  Allergen Reactions  . Esomeprazole Magnesium Other (See Comments)    cramps  . Septra (Bactrim) Other (See Comments)    Back ache      Review of Systems: See HPI for pertinent ROS. All other ROS negative.    Physical Exam: Blood pressure 152/92, pulse 68, temperature 97.7 F (36.5 C), temperature source Oral, resp. rate 18, height 5' 9.25" (1.759 m), weight 171 lb (77.565 kg)., Body mass index is 25.07 kg/(m^2). General: WNWD WM. Very pleasant.  Appears in no acute distress. Lungs: Clear bilaterally to auscultation without wheezes, rales, or rhonchi. Breathing is unlabored. Heart: Regular rhythm. No murmurs, rubs, or gallops. Msk:  Strength and tone normal for age. Extremities/Skin: Warm and dry.  No edema. Neuro: Alert and oriented X 3. Moves all extremities spontaneously. Gait is normal. CNII-XII grossly in tact. Psych:  Responds to questions appropriately with a normal affect.  ASSESSMENT AND PLAN:  62 y.o. year old male with  1. Insomnia - clonazePAM (KLONOPIN) 0.5 MG tablet; Take 1 tablet (0.5 mg total) by mouth 3 (three) times daily as needed.  Dispense: 90 tablet; Refill: 0 Use 1/2 QHS PRN.  Discussed that this medication can be addicting, can build up dependence and tolerance. He will cont to use sparingly. F/U prn.   [address] [place], [place], [facility] 07/04/2013 4:51 PM

## 2013-09-04 ENCOUNTER — Telehealth: Payer: Self-pay | Admitting: Family Medicine

## 2013-09-04 DIAGNOSIS — G4700 Insomnia, unspecified: Secondary | ICD-10-CM

## 2013-09-04 MED ORDER — CLONAZEPAM 0.5 MG PO TABS: 0.5000 mg | ORAL_TABLET | Freq: Three times a day (TID) | ORAL | Status: DC | PRN

## 2013-09-04 NOTE — Telephone Encounter (Signed)
Refill Clonazepam .5mg  1 tab po TID - LOV 07/04/13

## 2013-09-04 NOTE — Telephone Encounter (Signed)
Approved. #90 with 0 refills.

## 2013-09-04 NOTE — Telephone Encounter (Signed)
RX called in .

## 2013-09-20 ENCOUNTER — Ambulatory Visit (INDEPENDENT_AMBULATORY_CARE_PROVIDER_SITE_OTHER): Payer: BC Managed Care – PPO | Admitting: Family Medicine

## 2013-09-20 DIAGNOSIS — Z23 Encounter for immunization: Secondary | ICD-10-CM

## 2013-09-24 ENCOUNTER — Telehealth: Payer: Self-pay | Admitting: Family Medicine

## 2013-09-24 MED ORDER — RAMIPRIL 10 MG PO CAPS: 10.0000 mg | ORAL_CAPSULE | Freq: Every day | ORAL | Status: DC

## 2013-09-24 NOTE — Telephone Encounter (Signed)
Ramipril refill

## 2013-10-21 ENCOUNTER — Encounter: Payer: Self-pay | Admitting: Cardiovascular Disease

## 2013-10-21 ENCOUNTER — Ambulatory Visit (INDEPENDENT_AMBULATORY_CARE_PROVIDER_SITE_OTHER): Payer: BC Managed Care – PPO | Admitting: Cardiovascular Disease

## 2013-10-21 VITALS — BP 140/93 | HR 72 | Ht 69.0 in | Wt 172.8 lb

## 2013-10-21 DIAGNOSIS — I10 Essential (primary) hypertension: Secondary | ICD-10-CM | POA: Insufficient documentation

## 2013-10-21 MED ORDER — CARVEDILOL 12.5 MG PO TABS: 12.5000 mg | ORAL_TABLET | Freq: Two times a day (BID) | ORAL | Status: DC

## 2013-10-21 NOTE — Assessment & Plan Note (Signed)
Walter Cross is doing well. His blood pressure is mildly elevated today. We will increase his carvedilol 12.5 mg twice a day. We will make sure he  is not eating any extra salt.   I will see him in 1 year.  He will see him primary medical doctor in 4-5 months.

## 2013-10-21 NOTE — Progress Notes (Signed)
Walter Cross Date of Birth  [birth year] [facility] [address]    [address] [place], [place]  [address] [phone]  Fax  [phone]  History of Present Illness:  62 year old gentleman with a history of a mild hypertrophic obstructive cardiomyopathy. He is done very well on medical therapy. He's been on carvedilol and Cardizem. He does not have any episodes of chest pain or shortness breath. He exercises occasionally.  He's looking forward to getting up and walking work this summer and spring.  Apr 23, 2013:  Mandell presents with some episodes of chest pain last year. He had a stress Myoview study which was normal. He's had a recent echocardiogram which did not reveal any LVOT gradient  [name] placed him on an acid blocker and he is feeling well.    Nov. 24, 2014:    he was having some CP the last time I saw him.    A stress Myoview study which revealed no evidence of ischemia. He had normal left ventricular systolic function with an ejection fraction of 81%.  His CP have resolved after being started her on an acid blocker by Dr. Warrick Parisian.   Current Outpatient Prescriptions on File Prior to Visit  Medication Sig Dispense Refill  . acyclovir (ZOVIRAX) 5 % ointment Apply 1 application topically as needed.       Marland Kitchen aspirin 81 MG tablet Take 81 mg by mouth daily.        . carvedilol (COREG) 6.25 MG tablet Take 1 tablet (6.25 mg total) by mouth 2 (two) times daily with a meal.  60 tablet  5  . Cholecalciferol (VITAMIN D PO) Take 4,000 mg by mouth daily.      Marland Kitchen diltiazem (CARDIZEM CD) 180 MG 24 hr capsule Take 1 capsule (180 mg total) by mouth daily.  90 capsule  1  . meloxicam (MOBIC) 15 MG tablet Take 1 tablet (15 mg total) by mouth daily.  30 tablet  0  . NITROSTAT 0.4 MG SL tablet Place 0.4 mg under the tongue every 5 (five) minutes as needed.       . nystatin-triamcinolone (MYCOLOG) ointment Apply 1 application topically as needed.       . pantoprazole (PROTONIX)  40 MG tablet Take 40 mg by mouth daily.      . ramipril (ALTACE) 10 MG capsule Take 1 capsule (10 mg total) by mouth daily.  90 capsule  0   No current facility-administered medications on file prior to visit.    Allergies  Allergen Reactions  . Esomeprazole Magnesium Other (See Comments)    cramps  . Septra [Bactrim] Other (See Comments)    Back ache    Past Medical History  Diagnosis Date  . Hypertension   . Hyperlipidemia   . GERD (gastroesophageal reflux disease)   . Benign colon polyp   . Genital HSV   . Dermatitis     axillary  . 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 Vitamin D deficiency   . Hypertrophic cardiomyopathy     a. Echo 2007: mod LVH, diast dysfn, mild MR, no LVOT obstruction;  b. echo 9/13: upper septal thickening, no SAM of MV, no LVOT gradient, mild LVH, EF 65%  . Hx of cardiovascular stress test     ETT-Myoview 9/13: no ischemia, EF 81%    Past Surgical History  Procedure Laterality Date  . Cervical spine surgery  1992-1993    nerve release, Dr Gerlene Fee  .  Spine surgery      L4-L5, 1992-1993    History  Smoking status  . Former Smoker -- 1.00 packs/day for 7 years  . Types: Cigarettes  . Quit date: 11/29/1975  Smokeless tobacco  . Not on file    History  Alcohol Use  . 1.0 oz/week  . 2 drink(s) per week    Family History  Problem Relation Age of Onset  . Cancer Mother 2    cervical  . Cancer Father     lung    Reviw of Systems:  Reviewed in the HPI.  All other systems are negative.  Physical Exam: BP 140/93  Pulse 72  Ht 5\' 9"  (1.753 m)  Wt 172 lb 12.8 oz (78.382 kg)  BMI 25.51 kg/m2 The patient is alert and oriented x 3.  The mood and affect are normal.   Skin: warm and dry.  Color is normal.    HEENT:   the sclera are nonicteric.  The mucous membranes are moist.  The carotids are 2+ without bruits.  There is no thyromegaly.  There is no JVD.    Lungs: clear.  The chest wall is non tender.    Heart: regular  rate with a normal S1 and S2.  There is a 1-2 / 6 systolic murmur at the left sternal border.  Abdomen: good bowel sounds.  There is no guarding or rebound.  There is no hepatosplenomegaly or tenderness.  There are no masses.   Extremities:  no clubbing, cyanosis, or edema.  The legs are without rashes.  The distal pulses are intact.   Neuro:  Cranial nerves II - XII are intact.  Motor and sensory functions are intact.    The gait is normal.  EKG: May, 27, 2014: Normal sinus rhythm at 83. EKG is normal.  Assessment / Plan:

## 2013-10-21 NOTE — Patient Instructions (Addendum)
Your physician wants you to follow-up in: 1 year with Dr. Elease Hashimoto. You will receive a reminder letter in the mail two months in advance. If you don't receive a letter, please call our office to schedule the follow-up appointment.  Your physician has made the following changes to your medication:  1) Increase your Coreg to 12.5 mg twice a day.   REDUCE HIGH SODIUM FOODS LIKE CANNED SOUP, GRAVY, SAUCES, READY PREPARED FOODS LIKE FROZEN FOODS; LEAN CUISINE, LASAGNA. BACON, SAUSAGE, LUNCH MEAT, FAST FOODS, HOT DOGS, CHIPS, PIZZA, CHINESE FOOD, SOY SAUCE, STORE BOUGHT FRIED CHICKEN= [place] FRIED CHICKEN/ BOJANGLES.    3 to 4 Gram Sodium Diet, No Added Salt (NAS) A 3 to 4 gram sodium diet restricts the amount of sodium in the diet to no more than 3 to 4 g or 3000 to 4000 mg daily. Limiting the amount of sodium is often used to help lower blood pressure. It is important if you have heart, liver, or kidney problems. Many foods contain sodium for flavor and sometimes as a preservative. When the amount of sodium in a diet needs to be low, it is important to know what to look for when choosing foods and drinks. The following includes some information and guidelines to help make it easier for you to adapt to a low sodium diet. QUICK TIPS  Do not add salt to food.  Avoid convenience items and fast food.  Choose unsalted snack foods.  Buy lower sodium products, often labeled as "lower sodium" or "no salt added."  Check food labels to learn how much sodium is in 1 serving.  When eating at a restaurant, ask that your food be prepared with less salt or none, if possible. READING FOOD LABELS FOR SODIUM INFORMATION The nutrition facts label is a good place to find how much sodium is in foods. Look for products with no more than 500 to 600 mg of sodium per meal and no more than 150 mg per serving. Remember that 3 to 4 g = 3000 to 4000 mg. The food label may also list foods as:  Sodium-free: Less than 5 mg  in a serving.  Very low sodium: 35 mg or less in a serving.  Low-sodium: 140 mg or less in a serving.  Light in sodium: 50% less sodium in a serving. For example, if a food that usually has 300 mg of sodium is changed to become light in sodium, it will have 150 mg of sodium.  Reduced sodium: 25% less sodium in a serving. For example, if a food that usually has 400 mg of sodium is changed to reduced sodium, it will have 300 mg of sodium. CHOOSING FOODS Grains  Avoid: Salted crackers and snack items. Bread stuffing and biscuit mixes. Seasoned rice or pasta mixes.  Choose: Unsalted snack items. English muffins, breads, and rolls. Homemade pancakes and waffles. Most cereals. Pasta. Meats  Avoid: Salted, canned, smoked, spiced, pickled meats, including fish and poultry. Bacon, ham, sausage, cold cuts, hot dogs, anchovies.  Choose: Low-sodium canned tuna and salmon. Fresh or frozen meat, poultry, and fish. Dairy  Avoid: Processed cheese and spreads. Cottage cheese. Buttermilk and condensed milk. Regular cheese.  Choose: Milk. Low-sodium cottage cheese. Yogurt. Sour cream. Low-sodium cheese. Fruits and Vegetables  Avoid: Regular canned vegetables. Regular canned tomato sauce and paste. Frozen vegetables in sauces. Olives. Rosita Fire. Relishes. Sauerkraut.  Choose: Low-sodium canned vegetables. Low-sodium tomato sauce and paste. Frozen or fresh vegetables. Fresh and frozen fruit. Condiments  Avoid: Canned  and packaged gravies. Worcestershire sauce. Tartar sauce. Barbecue sauce. Soy sauce. Steak sauce. Ketchup. Onion, garlic, and table salt. Meat flavorings and tenderizers.  Choose: Fresh and dried herbs and spices. Low-sodium varieties of mustard and ketchup. Lemon juice. Tabasco sauce. Horseradish. SAMPLE 3 TO 4 GRAM SODIUM MEAL PLAN  Breakfast / Sodium (mg)  1 cup low-fat milk / 143 mg  2 slices whole-wheat toast / 270 mg  1 tbs heart-healthy margarine / 153 mg  1 hard-boiled egg /  139 mg Lunch / Sodium (mg)  1 cup raw carrots / 76 mg   cup hummus / 298 mg  1 cup low-fat milk / 143 mg   cup red grapes / 2 mg  1 cup low-sodium chicken and rice soup / 480 mg  10 low-sodium saltine crackers / 191 mg Dinner / Sodium (mg)  1 cup whole-wheat pasta / 2 mg  1 cup tomato sauce / 1178 mg  3 oz lean ground beef / 57 mg  1 small side salad (1 cup raw spinach leaves,  cup cucumber,  cup yellow bell pepper) / 25 mg  1 tsp ranch dressing / 144 mg Snack / Sodium (mg)  1 slice cheddar cheese / 258 mg  1 medium apple / 1 mg Nutrient Analysis  Calories: 2005  Protein: 85 g  Carbohydrate: 245 g  Fat: 78 g  Sodium: 3560 mg Document Released: 11/14/2005 Document Revised: 02/06/2012 Document Reviewed: 02/15/2010 [employer] Patient Information 2014 [place], [place].

## 2013-11-12 ENCOUNTER — Other Ambulatory Visit: Payer: Self-pay

## 2013-11-12 MED ORDER — DILTIAZEM HCL ER COATED BEADS 180 MG PO CP24: 180.0000 mg | ORAL_CAPSULE | Freq: Every day | ORAL | Status: DC

## 2013-11-28 DIAGNOSIS — D126 Benign neoplasm of colon, unspecified: Secondary | ICD-10-CM

## 2013-11-28 HISTORY — DX: Benign neoplasm of colon, unspecified: D12.6

## 2013-12-12 ENCOUNTER — Other Ambulatory Visit: Payer: Self-pay | Admitting: Family Medicine

## 2013-12-12 MED ORDER — RAMIPRIL 10 MG PO CAPS: 10.0000 mg | ORAL_CAPSULE | Freq: Every day | ORAL | Status: DC

## 2014-01-08 ENCOUNTER — Telehealth: Payer: Self-pay | Admitting: Cardiovascular Disease

## 2014-01-08 NOTE — Telephone Encounter (Signed)
Spoke with pt, aware he does not require pre-med

## 2014-01-08 NOTE — Telephone Encounter (Signed)
New message   Upcoming dental appt patient has questions regarding pre-med.

## 2014-03-11 ENCOUNTER — Ambulatory Visit (INDEPENDENT_AMBULATORY_CARE_PROVIDER_SITE_OTHER): Payer: BC Managed Care – PPO | Admitting: Family Medicine

## 2014-03-11 ENCOUNTER — Encounter: Payer: Self-pay | Admitting: Family Medicine

## 2014-03-11 ENCOUNTER — Other Ambulatory Visit: Payer: Self-pay | Admitting: Family Medicine

## 2014-03-11 VITALS — BP 140/70 | HR 82 | Temp 98.6°F | Resp 20 | Ht 69.0 in | Wt 173.0 lb

## 2014-03-11 DIAGNOSIS — Z0000 Encounter for general adult medical examination without abnormal findings: Secondary | ICD-10-CM

## 2014-03-11 DIAGNOSIS — Z125 Encounter for screening for malignant neoplasm of prostate: Secondary | ICD-10-CM

## 2014-03-11 DIAGNOSIS — Z7251 High risk heterosexual behavior: Secondary | ICD-10-CM

## 2014-03-11 DIAGNOSIS — Z79899 Other long term (current) drug therapy: Secondary | ICD-10-CM

## 2014-03-11 LAB — HIV ANTIBODY (ROUTINE TESTING W REFLEX): HIV 1&2 Ab, 4th Generation: NONREACTIVE

## 2014-03-11 LAB — LIPID PANEL
Cholesterol: 202 mg/dL — ABNORMAL HIGH (ref 0–200)
HDL: 69 mg/dL (ref >39)
LDL Cholesterol: 114 mg/dL — ABNORMAL HIGH (ref 0–99)
Total CHOL/HDL Ratio: 2.9 Ratio
Triglycerides: 97 mg/dL (ref <150)
VLDL: 19 mg/dL (ref 0–40)

## 2014-03-11 LAB — COMPLETE METABOLIC PANEL WITH GFR
ALT: 30 U/L (ref 0–53)
AST: 27 U/L (ref 0–37)
Albumin: 4.3 g/dL (ref 3.5–5.2)
Alkaline Phosphatase: 67 U/L (ref 39–117)
BUN: 9 mg/dL (ref 6–23)
CO2: 29 mEq/L (ref 19–32)
Calcium: 9.4 mg/dL (ref 8.4–10.5)
Chloride: 100 mEq/L (ref 96–112)
Creat: 0.82 mg/dL (ref 0.50–1.35)
GFR, Est African American: >89 mL/min
GFR, Est Non African American: >89 mL/min
Glucose, Bld: 121 mg/dL — ABNORMAL HIGH (ref 70–99)
Potassium: 4.7 mEq/L (ref 3.5–5.3)
Sodium: 139 mEq/L (ref 135–145)
Total Bilirubin: 0.9 mg/dL (ref 0.2–1.2)
Total Protein: 6.9 g/dL (ref 6.0–8.3)

## 2014-03-11 LAB — CBC WITH DIFFERENTIAL/PLATELET
Basophils Absolute: 0 10*3/uL (ref 0.0–0.1)
Basophils Relative: 1 % (ref 0–1)
Eosinophils Absolute: 0.1 10*3/uL (ref 0.0–0.7)
Eosinophils Relative: 2 % (ref 0–5)
HCT: 41.6 % (ref 39.0–52.0)
Hemoglobin: 15 g/dL (ref 13.0–17.0)
Lymphocytes Relative: 20 % (ref 12–46)
Lymphs Abs: 0.9 10*3/uL (ref 0.7–4.0)
MCH: 33.4 pg (ref 26.0–34.0)
MCHC: 36.1 g/dL — ABNORMAL HIGH (ref 30.0–36.0)
MCV: 92.7 fL (ref 78.0–100.0)
Monocytes Absolute: 0.8 10*3/uL (ref 0.1–1.0)
Monocytes Relative: 16 % — ABNORMAL HIGH (ref 3–12)
Neutro Abs: 2.9 10*3/uL (ref 1.7–7.7)
Neutrophils Relative %: 61 % (ref 43–77)
Platelets: 218 10*3/uL (ref 150–400)
RBC: 4.49 MIL/uL (ref 4.22–5.81)
RDW: 13.7 % (ref 11.5–15.5)
WBC: 4.7 10*3/uL (ref 4.0–10.5)

## 2014-03-11 LAB — TSH: TSH: 2.402 u[IU]/mL (ref 0.350–4.500)

## 2014-03-11 MED ORDER — ACYCLOVIR 5 % EX OINT
1.0000 | TOPICAL_OINTMENT | CUTANEOUS | Status: DC | PRN
Start: 2014-03-11 — End: 2015-10-29

## 2014-03-11 MED ORDER — NYSTATIN-TRIAMCINOLONE 100000-0.1 UNIT/GM-% EX OINT: 1.0000 "application " | TOPICAL_OINTMENT | CUTANEOUS | Status: DC | PRN

## 2014-03-11 NOTE — Addendum Note (Signed)
Addended by: Sharmon Revere on: 03/11/2014 10:53 AM   Modules accepted: Orders

## 2014-03-11 NOTE — Addendum Note (Signed)
Addended by: Shary Decamp B on: 03/11/2014 10:25 AM   Modules accepted: Orders

## 2014-03-11 NOTE — Progress Notes (Signed)
Subjective:    Patient ID: Walter Cross, male    DOB: 10/06/1951, 63 y.o.   MRN: [id number]  HPI  Patient is a very pleasant 63 year old white male comes in today for complete physical exam. He has no concerns. He does request an HIV test. He has a new sexual partner and would like to be screened. Otherwise he is doing well with no complaints. His blood pressures borderline elevated today at 140/70. He is due for a prostate exam. He is due for the shingles vaccine. He is not due for a pneumonia vaccine. His last colonoscopy was in 2012. At that time he was found to have 2 tubular adenomas. His gastroenterologist recommended a repeat colonoscopy in 3 years. He would be due this August. Past Medical History  Diagnosis Date  . Hypertension   . Hyperlipidemia   . GERD (gastroesophageal reflux disease)   . Benign colon polyp   . Genital HSV   . Dermatitis     axillary  . 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 Vitamin D deficiency   . Hypertrophic cardiomyopathy     a. Echo 2007: mod LVH, diast dysfn, mild MR, no LVOT obstruction;  b. echo 9/13: upper septal thickening, no SAM of MV, no LVOT gradient, mild LVH, EF 65%  . Hx of cardiovascular stress test     ETT-Myoview 9/13: no ischemia, EF 81%   Past Surgical History  Procedure Laterality Date  . Cervical spine surgery  1992-1993    nerve release, Dr Hal Neer  . Spine surgery      L4-L5, [phone]   Current Outpatient Prescriptions on File Prior to Visit  Medication Sig Dispense Refill  . acyclovir (ZOVIRAX) 5 % ointment Apply 1 application topically as needed.       Marland Kitchen aspirin 81 MG tablet Take 81 mg by mouth daily.        . carvedilol (COREG) 12.5 MG tablet Take 1 tablet (12.5 mg total) by mouth 2 (two) times daily with a meal.  60 tablet  5  . Cholecalciferol (VITAMIN D PO) Take 4,000 mg by mouth daily.      . cimetidine (TAGAMET) 200 MG tablet Take 200 mg by mouth 2 (two) times daily.      Marland Kitchen diltiazem (CARDIZEM CD)  180 MG 24 hr capsule Take 1 capsule (180 mg total) by mouth daily.  90 capsule  1  . ibuprofen (ADVIL,MOTRIN) 200 MG tablet Take 200 mg by mouth every 4 (four) hours as needed.      Marland Kitchen NITROSTAT 0.4 MG SL tablet Place 0.4 mg under the tongue every 5 (five) minutes as needed.       . nystatin-triamcinolone (MYCOLOG) ointment Apply 1 application topically as needed.       . pantoprazole (PROTONIX) 40 MG tablet Take 40 mg by mouth daily.      . ramipril (ALTACE) 10 MG capsule Take 1 capsule (10 mg total) by mouth daily.  90 capsule  1  . clonazePAM (KLONOPIN) 0.5 MG tablet Take 0.5 mg by mouth at bedtime as needed.       No current facility-administered medications on file prior to visit.   Allergies  Allergen Reactions  . Esomeprazole Magnesium Other (See Comments)    cramps  . Septra [Bactrim] Other (See Comments)    Back ache   History   Social History  . Marital Status: Widowed    Spouse Name: N/A    Number  of Children: N/A  . Years of Education: N/A   Occupational History  . communications tech    Social History Main Topics  . Smoking status: Former Smoker -- 1.00 packs/day for 7 years    Types: Cigarettes    Quit date: 11/29/1975  . Smokeless tobacco: Not on file  . Alcohol Use: 1.0 oz/week    2 drink(s) per week  . Drug Use: No  . Sexual Activity: Not on file   Other Topics Concern  . Not on file   Social History Narrative  . No narrative on file   Family History  Problem Relation Age of Onset  . Cancer Mother 26    cervical  . Cancer Father     lung     Review of Systems  All other systems reviewed and are negative.      Objective:   Physical Exam  Vitals reviewed. Constitutional: He is oriented to person, place, and time. He appears well-developed and well-nourished. No distress.  HENT:  Head: Normocephalic and atraumatic.  Right Ear: External ear normal.  Left Ear: External ear normal.  Nose: Nose normal.  Mouth/Throat: Oropharynx is clear and  moist. No oropharyngeal exudate.  Eyes: Conjunctivae and EOM are normal. Pupils are equal, round, and reactive to light. Right eye exhibits no discharge. Left eye exhibits no discharge. No scleral icterus.  Neck: Normal range of motion. Neck supple. No JVD present. No tracheal deviation present. No thyromegaly present.  Cardiovascular: Normal rate, regular rhythm, normal heart sounds and intact distal pulses.  Exam reveals no gallop and no friction rub.   No murmur heard. Pulmonary/Chest: Effort normal and breath sounds normal. No stridor. No respiratory distress. He has no wheezes. He has no rales. He exhibits no tenderness.  Abdominal: Soft. Bowel sounds are normal. He exhibits no distension and no mass. There is no tenderness. There is no rebound and no guarding.  Genitourinary: Rectum normal, prostate normal and penis normal.  Musculoskeletal: Normal range of motion. He exhibits no edema and no tenderness.  Lymphadenopathy:    He has no cervical adenopathy.  Neurological: He is alert and oriented to person, place, and time. He has normal reflexes. He displays normal reflexes. No cranial nerve deficit. He exhibits normal muscle tone. Coordination normal.  Skin: Skin is warm. No rash noted. He is not diaphoretic. No erythema. No pallor.  Psychiatric: He has a normal mood and affect. His behavior is normal. Judgment and thought content normal.          Assessment & Plan:  1. High risk sexual behavior - HIV antibody  2. Encounter for long-term (current) use of other medications - COMPLETE METABOLIC PANEL WITH GFR - Lipid panel - TSH - CBC with Differential - PSA, Medicare - HIV antibody  3. Routine general medical examination at a health care facility - PSA, Medicare  Patient's blood pressure is borderline today. I have asked him to check his blood pressure frequently at home. If his blood pressures consistently 140/90 or greater I would add hydrochlorothiazide to his medication.  Otherwise remainder of his physical exam is normal. Check a PSA screen for prostate cancer. I have asked the patient to call his gastroenterologist and schedule his repeat colonoscopy for this fall. Patient is due for the zoster vaccine we discussed this at length. He will check with his insurance first.  Regular anticipatory guidance is provided.

## 2014-03-12 LAB — PSA: PSA: 0.57 ng/mL (ref <=4.00)

## 2014-03-14 LAB — HEMOGLOBIN A1C
Hgb A1c MFr Bld: 5.5 % (ref <5.7)
Mean Plasma Glucose: 111 mg/dL (ref <117)

## 2014-03-18 ENCOUNTER — Telehealth: Payer: Self-pay | Admitting: Family Medicine

## 2014-03-18 DIAGNOSIS — Z77090 Contact with and (suspected) exposure to asbestos: Secondary | ICD-10-CM

## 2014-03-18 NOTE — Telephone Encounter (Signed)
ok 

## 2014-03-18 NOTE — Telephone Encounter (Signed)
Pt states that he discussed with you having a CXR secondary to being exposed to asbestos? Ok to do?

## 2014-03-18 NOTE — Telephone Encounter (Signed)
CXR ordered and pt aware

## 2014-04-01 ENCOUNTER — Ambulatory Visit
Admission: RE | Admit: 2014-04-01 | Discharge: 2014-04-01 | Disposition: A | Discharge status: Home / Self Care | Payer: BC Managed Care – PPO | Source: Ambulatory Visit | Attending: Family Medicine | Admitting: Family Medicine

## 2014-04-01 DIAGNOSIS — Z77090 Contact with and (suspected) exposure to asbestos: Secondary | ICD-10-CM

## 2014-04-01 IMAGING — CR DG CHEST 2V
2 series · 2 of 2 positions shown · non-contrast
Comparison: [DATE].

CLINICAL DATA: ASBESTOS EXPOSURE. ASBESTOS RELATED PLEURAL DISEASE
OR LUNG DISEASE.

EXAM:
CHEST  2 VIEW

[view not recorded (1 of 2)]
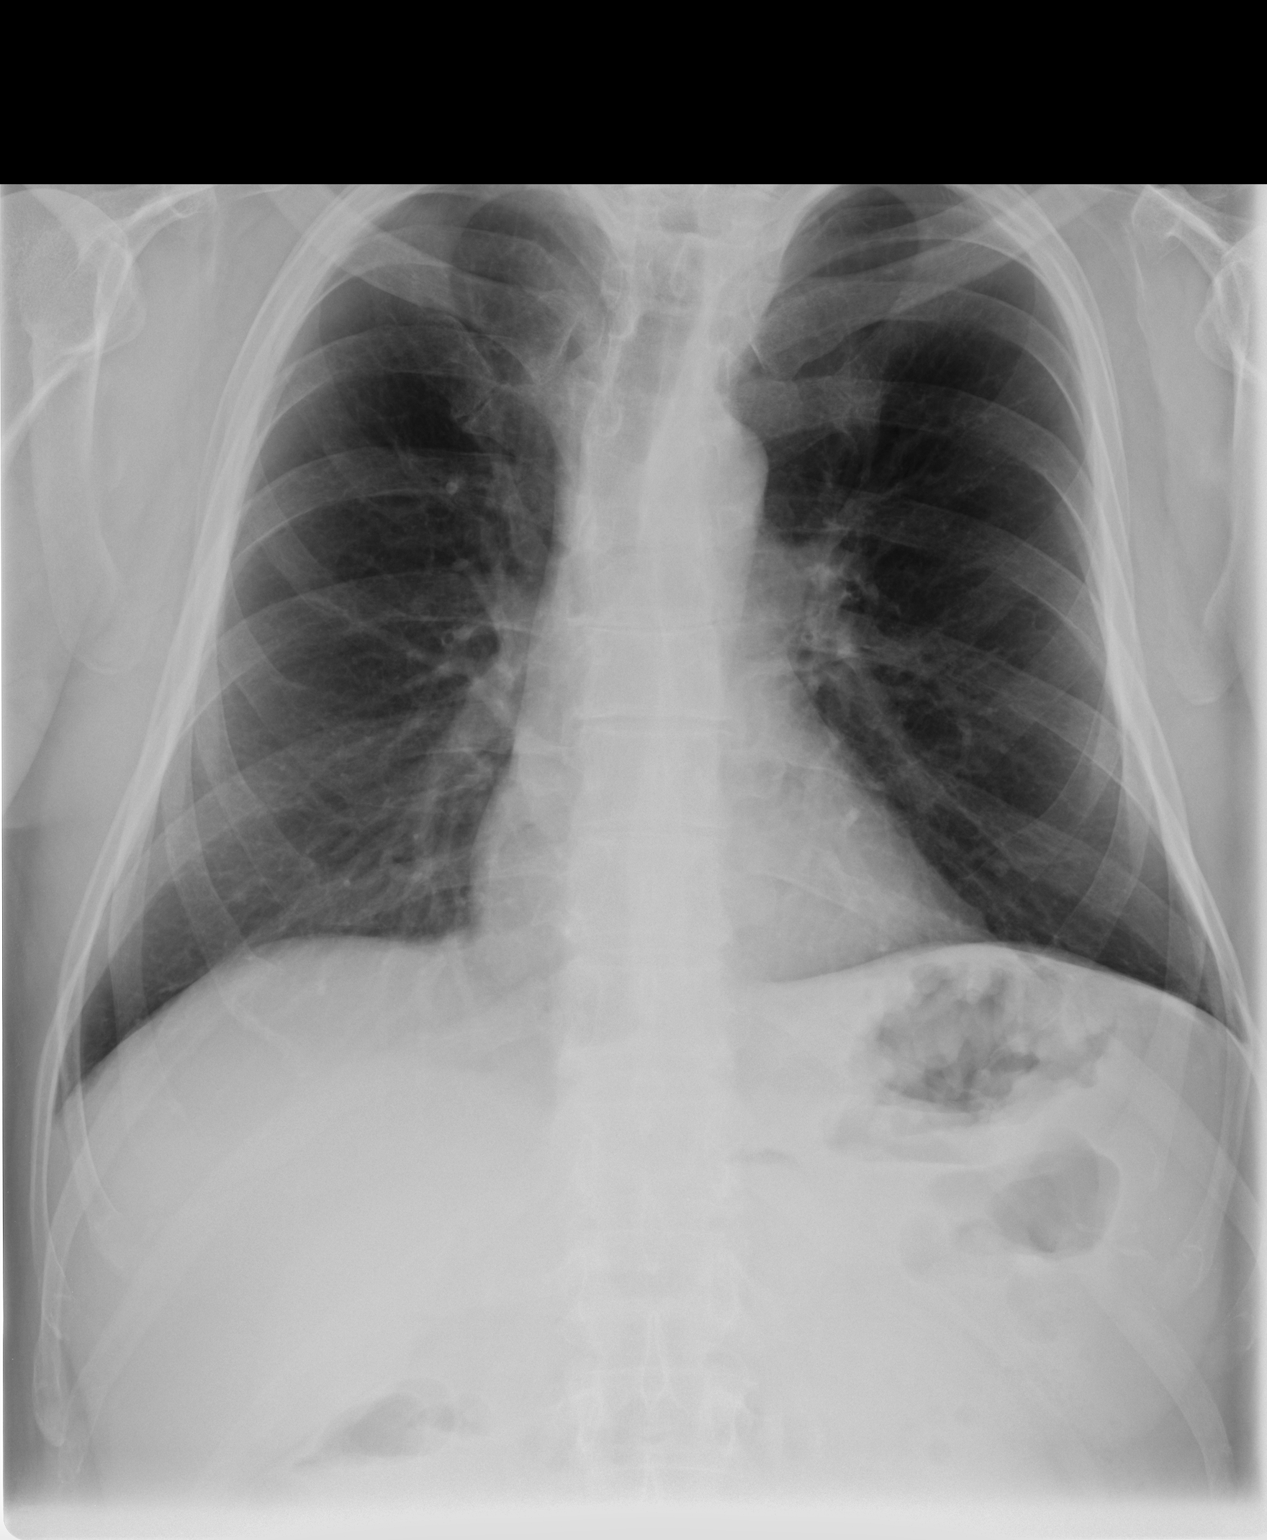

[view not recorded (2 of 2)]
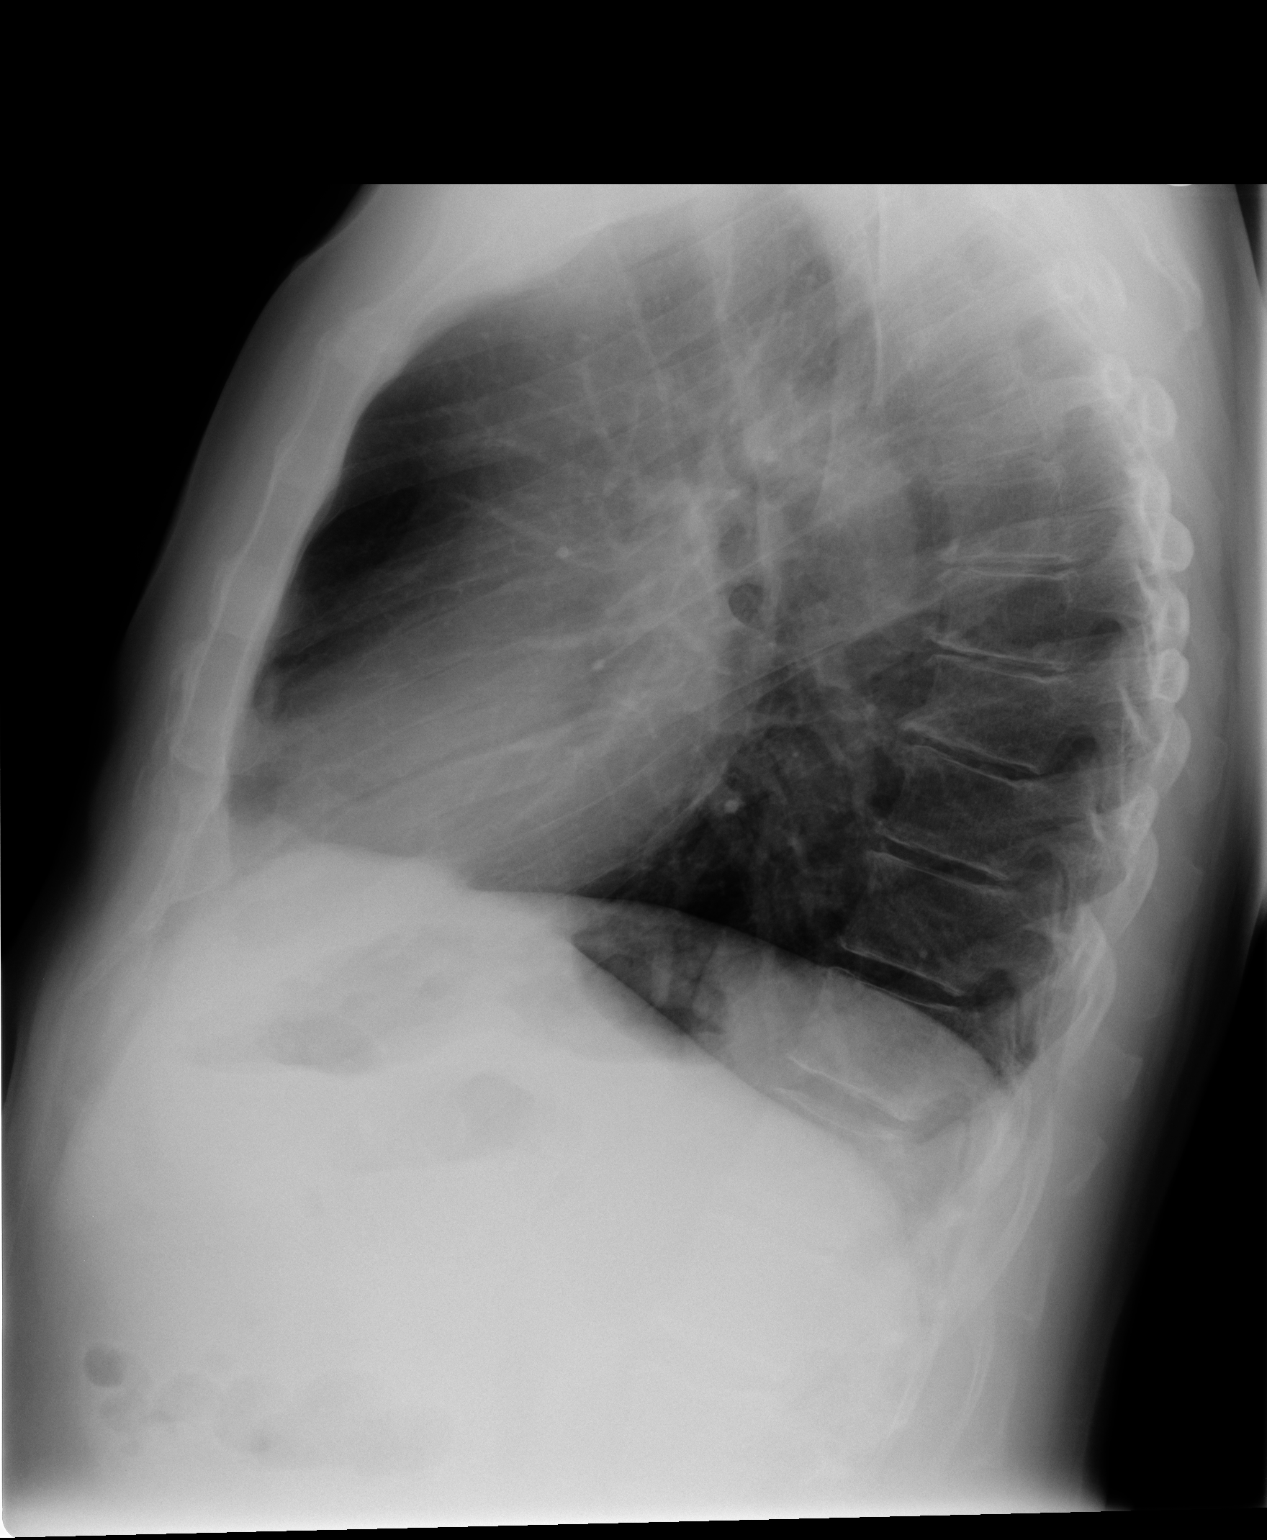

[2 of 2 positions shown; findings below may reference images not displayed]

FINDINGS: Cardiopericardial silhouette within normal limits. Mediastinal
contours normal. Trachea midline. No airspace disease or effusion.
No pulmonary fibrosis or calcified pleural plaques. Mild
hyperinflation evident on the lateral view.
IMPRESSION: No active cardiopulmonary disease. No findings of asbestos related
pleural disease.

## 2014-05-09 ENCOUNTER — Other Ambulatory Visit: Payer: Self-pay | Admitting: *Deleted

## 2014-05-09 MED ORDER — DILTIAZEM HCL ER COATED BEADS 180 MG PO CP24: 180.0000 mg | ORAL_CAPSULE | Freq: Every day | ORAL | Status: DC

## 2014-05-22 ENCOUNTER — Other Ambulatory Visit: Payer: Self-pay

## 2014-05-22 MED ORDER — CARVEDILOL 12.5 MG PO TABS: 12.5000 mg | ORAL_TABLET | Freq: Two times a day (BID) | ORAL | Status: DC

## 2014-06-19 ENCOUNTER — Other Ambulatory Visit: Payer: Self-pay | Admitting: Family Medicine

## 2014-06-19 MED ORDER — RAMIPRIL 10 MG PO CAPS: 10.0000 mg | ORAL_CAPSULE | Freq: Every day | ORAL | Status: DC

## 2014-06-19 NOTE — Telephone Encounter (Signed)
Med sent to pharm 

## 2014-07-11 ENCOUNTER — Other Ambulatory Visit: Payer: Self-pay | Admitting: *Deleted

## 2014-07-11 MED ORDER — CARVEDILOL 12.5 MG PO TABS: 12.5000 mg | ORAL_TABLET | Freq: Two times a day (BID) | ORAL | Status: DC

## 2014-08-08 ENCOUNTER — Encounter: Payer: Self-pay | Admitting: Family Medicine

## 2014-09-19 ENCOUNTER — Other Ambulatory Visit: Payer: Self-pay | Admitting: *Deleted

## 2014-09-19 MED ORDER — CARVEDILOL 12.5 MG PO TABS: 12.5000 mg | ORAL_TABLET | Freq: Two times a day (BID) | ORAL | Status: DC

## 2014-10-28 ENCOUNTER — Ambulatory Visit (INDEPENDENT_AMBULATORY_CARE_PROVIDER_SITE_OTHER): Payer: BC Managed Care – PPO | Admitting: Cardiovascular Disease

## 2014-10-28 ENCOUNTER — Encounter: Payer: Self-pay | Admitting: Cardiovascular Disease

## 2014-10-28 VITALS — BP 120/70 | HR 54 | Ht 69.0 in | Wt 174.4 lb

## 2014-10-28 DIAGNOSIS — I10 Essential (primary) hypertension: Secondary | ICD-10-CM

## 2014-10-28 DIAGNOSIS — I421 Obstructive hypertrophic cardiomyopathy: Secondary | ICD-10-CM

## 2014-10-28 NOTE — Assessment & Plan Note (Signed)
BP is stable.

## 2014-10-28 NOTE — Patient Instructions (Signed)
Your physician recommends that you continue on your current medications as directed. Please refer to the Current Medication list given to you today.  Your physician wants you to follow-up in: 1 year with Dr. Nahser.  You will receive a reminder letter in the mail two months in advance. If you don't receive a letter, please call our office to schedule the follow-up appointment.  

## 2014-10-28 NOTE — Assessment & Plan Note (Signed)
Walter Cross is doing well. No symptoms. Continue current meds

## 2014-10-28 NOTE — Progress Notes (Signed)
Walter Cross Date of Birth  06-18-1951 Walter Cross [address]    [place], [place]  [address] [phone]  Fax  [phone]  History of Present Illness:  63 year old gentleman with a history of a mild hypertrophic obstructive cardiomyopathy. He is done very well on medical therapy. He's been on carvedilol and Cardizem. He does not have any episodes of chest pain or shortness breath. He exercises occasionally.  He's looking forward to getting up and walking work this summer and spring.  Apr 23, 2013:  Walter Cross presents with some episodes of chest pain last year. He had a stress Myoview study which was normal. He's had a recent echocardiogram which did not reveal any LVOT gradient  [name] placed him on an acid blocker and he is feeling well.    Nov. 24, 2014:    he was having some CP the last time I saw him.    A stress Myoview study which revealed no evidence of ischemia. He had normal left ventricular systolic function with an ejection fraction of 81%.  His CP have resolved after being started her on an acid blocker by Dr. [name].  Dec. 1, 2015:  Walter Cross is doing well. No cardiac issues.  No further GERD issues.   Admits that he could use a bit more exercise.    Current Outpatient Prescriptions on File Prior to Visit  Medication Sig Dispense Refill  . acyclovir ointment (ZOVIRAX) 5 % Apply 1 application topically as needed. 15 g 2  . aspirin 81 MG tablet Take 81 mg by mouth daily.      . carvedilol (COREG) 12.5 MG tablet Take 1 tablet (12.5 mg total) by mouth 2 (two) times daily with a meal. 60 tablet 11  . Cholecalciferol (VITAMIN D PO) Take 4,000 mg by mouth daily.    . cimetidine (TAGAMET) 200 MG tablet Take 200 mg by mouth 2 (two) times daily.    . clonazePAM (KLONOPIN) 0.5 MG tablet Take 0.5 mg by mouth at bedtime as needed.    . diltiazem (CARDIZEM CD) 180 MG 24 hr capsule Take 1 capsule (180 mg total) by mouth daily. 90 capsule 1  . ibuprofen  (ADVIL,MOTRIN) 200 MG tablet Take 200 mg by mouth every 4 (four) hours as needed.    Marland Kitchen NITROSTAT 0.4 MG SL tablet Place 0.4 mg under the tongue every 5 (five) minutes as needed.     . nystatin-triamcinolone ointment (MYCOLOG) Apply 1 application topically as needed. 30 g 2  . pantoprazole (PROTONIX) 40 MG tablet Take 40 mg by mouth daily.    . ramipril (ALTACE) 10 MG capsule Take 1 capsule (10 mg total) by mouth daily. 90 capsule 2   No current facility-administered medications on file prior to visit.    Allergies  Allergen Reactions  . Esomeprazole Magnesium Other (See Comments)    cramps  . Septra [Bactrim] Other (See Comments)    Back ache    Past Medical History  Diagnosis Date  . Hypertension   . Hyperlipidemia   . GERD (gastroesophageal reflux disease)   . Benign colon polyp   . Genital HSV   . Dermatitis     axillary  . 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 Vitamin D deficiency   . Hypertrophic cardiomyopathy     a. Echo 2007: mod LVH, diast dysfn, mild MR, no LVOT obstruction;  b. echo 9/13: upper septal thickening, no SAM of MV, no LVOT gradient,  mild LVH, EF 65%  . Hx of cardiovascular stress test     ETT-Myoview 9/13: no ischemia, EF 81%  . Barrett's esophagus   . Adenomatous colon polyp 2015     Buccini (q 5 yrs)     Past Surgical History  Procedure Laterality Date  . Cervical spine surgery  1992-1993    nerve release, Dr Hal Neer  . Spine surgery      L4-L5, 1992-1993    History  Smoking status  . Former Smoker -- 1.00 packs/day for 7 years  . Types: Cigarettes  . Quit date: 11/29/1975  Smokeless tobacco  . Not on file    History  Alcohol Use  . 1.0 oz/week  . 2 drink(s) per week    Family History  Problem Relation Age of Onset  . Cancer Mother 82    cervical  . Cancer Father     lung    Reviw of Systems:  Reviewed in the HPI.  All other systems are negative.  Physical Exam: BP 120/70 mmHg  Pulse 54  Ht 5\' 9"  (1.753 m)   Wt 174 lb 6.4 oz (79.107 kg)  BMI 25.74 kg/m2 The patient is alert and oriented x 3.  The mood and affect are normal.   Skin: warm and dry.  Color is normal.    HEENT:   the sclera are nonicteric.  The mucous membranes are moist.  The carotids are 2+ without bruits.  There is no thyromegaly.  There is no JVD.    Lungs: clear.  The chest wall is non tender.    Heart: regular rate with a normal S1 and S2.  There is a 1 / 6 systolic murmur at the left sternal border.  Abdomen: good bowel sounds.  There is no guarding or rebound.  There is no hepatosplenomegaly or tenderness.  There are no masses.   Extremities:  no clubbing, cyanosis, or edema.  The legs are without rashes.  The distal pulses are intact.   Neuro:  Cranial nerves II - XII are intact.  Motor and sensory functions are intact.    The gait is normal.  EKG:10/28/2014: Sinus bradycardia 54. Early repolarization. Otherwise , He has no ST or T wave changes.  Assessment / Plan:

## 2014-11-07 ENCOUNTER — Other Ambulatory Visit: Payer: Self-pay

## 2014-11-07 MED ORDER — DILTIAZEM HCL ER COATED BEADS 180 MG PO CP24: 180.0000 mg | ORAL_CAPSULE | Freq: Every day | ORAL | Status: DC

## 2014-11-19 ENCOUNTER — Ambulatory Visit (INDEPENDENT_AMBULATORY_CARE_PROVIDER_SITE_OTHER): Payer: BC Managed Care – PPO | Admitting: Family Medicine

## 2014-11-19 DIAGNOSIS — Z23 Encounter for immunization: Secondary | ICD-10-CM

## 2015-03-13 ENCOUNTER — Encounter: Payer: BC Managed Care – PPO | Admitting: Family Medicine

## 2015-03-17 ENCOUNTER — Other Ambulatory Visit: Payer: Self-pay | Admitting: Family Medicine

## 2015-03-17 ENCOUNTER — Ambulatory Visit (INDEPENDENT_AMBULATORY_CARE_PROVIDER_SITE_OTHER): Payer: Medicare Other | Admitting: Family Medicine

## 2015-03-17 ENCOUNTER — Encounter: Payer: Self-pay | Admitting: Family Medicine

## 2015-03-17 VITALS — BP 136/64 | HR 60 | Temp 97.9°F | Resp 16 | Ht 69.0 in | Wt 178.0 lb

## 2015-03-17 DIAGNOSIS — Z23 Encounter for immunization: Secondary | ICD-10-CM | POA: Diagnosis not present

## 2015-03-17 DIAGNOSIS — Z0000 Encounter for general adult medical examination without abnormal findings: Secondary | ICD-10-CM | POA: Diagnosis not present

## 2015-03-17 LAB — CBC WITH DIFFERENTIAL/PLATELET
Basophils Absolute: 0.1 10*3/uL (ref 0.0–0.1)
Basophils Relative: 1 % (ref 0–1)
Eosinophils Absolute: 0.1 10*3/uL (ref 0.0–0.7)
Eosinophils Relative: 2 % (ref 0–5)
HCT: 46.3 % (ref 39.0–52.0)
Hemoglobin: 15.9 g/dL (ref 13.0–17.0)
Lymphocytes Relative: 24 % (ref 12–46)
Lymphs Abs: 1.3 10*3/uL (ref 0.7–4.0)
MCH: 32.9 pg (ref 26.0–34.0)
MCHC: 34.3 g/dL (ref 30.0–36.0)
MCV: 95.7 fL (ref 78.0–100.0)
MPV: 10.6 fL (ref 8.6–12.4)
Monocytes Absolute: 0.7 10*3/uL (ref 0.1–1.0)
Monocytes Relative: 13 % — ABNORMAL HIGH (ref 3–12)
Neutro Abs: 3.2 10*3/uL (ref 1.7–7.7)
Neutrophils Relative %: 60 % (ref 43–77)
Platelets: 232 10*3/uL (ref 150–400)
RBC: 4.84 MIL/uL (ref 4.22–5.81)
RDW: 13.3 % (ref 11.5–15.5)
WBC: 5.4 10*3/uL (ref 4.0–10.5)

## 2015-03-17 LAB — COMPLETE METABOLIC PANEL WITH GFR
ALT: 22 U/L (ref 0–53)
AST: 20 U/L (ref 0–37)
Albumin: 4.3 g/dL (ref 3.5–5.2)
Alkaline Phosphatase: 69 U/L (ref 39–117)
BUN: 11 mg/dL (ref 6–23)
CO2: 24 mEq/L (ref 19–32)
Calcium: 9.3 mg/dL (ref 8.4–10.5)
Chloride: 101 mEq/L (ref 96–112)
Creat: 0.89 mg/dL (ref 0.50–1.35)
GFR, Est African American: >89 mL/min
GFR, Est Non African American: >89 mL/min
Glucose, Bld: 135 mg/dL — ABNORMAL HIGH (ref 70–99)
Potassium: 4.7 mEq/L (ref 3.5–5.3)
Sodium: 138 mEq/L (ref 135–145)
Total Bilirubin: 0.9 mg/dL (ref 0.2–1.2)
Total Protein: 6.7 g/dL (ref 6.0–8.3)

## 2015-03-17 LAB — LIPID PANEL
Cholesterol: 181 mg/dL (ref 0–200)
HDL: 64 mg/dL (ref >=40)
LDL Cholesterol: 97 mg/dL (ref 0–99)
Total CHOL/HDL Ratio: 2.8 Ratio
Triglycerides: 102 mg/dL (ref <150)
VLDL: 20 mg/dL (ref 0–40)

## 2015-03-17 MED ORDER — RAMIPRIL 10 MG PO CAPS: 10.0000 mg | ORAL_CAPSULE | Freq: Every day | ORAL | Status: DC

## 2015-03-17 NOTE — Progress Notes (Signed)
Subjective:    Patient ID: Walter Cross, male    DOB: 06/07/1951, 64 y.o.   MRN: [id number]  HPI Patient is here today for complete physical exam. He has no concerns. His blood pressures well controlled 136/64. He is due for his annual prostate exam. He is due for a PSA. His colonoscopy was performed last year and was normal. He is due for Pneumovax 23. He will be due for Prevnar 13 next year. We did discuss the shingles vaccine but he would like to call his insurance first prior to receiving the vaccine.mh Past Surgical History  Procedure Laterality Date  . Cervical spine surgery  1992-1993    nerve release, Dr Hal Neer  . Spine surgery      L4-L5, [id number]   Current Outpatient Prescriptions on File Prior to Visit  Medication Sig Dispense Refill  . acyclovir ointment (ZOVIRAX) 5 % Apply 1 application topically as needed. 15 g 2  . aspirin 81 MG tablet Take 81 mg by mouth daily.      . carvedilol (COREG) 12.5 MG tablet Take 1 tablet (12.5 mg total) by mouth 2 (two) times daily with a meal. 60 tablet 11  . Cholecalciferol (VITAMIN D PO) Take 4,000 mg by mouth daily.    . cimetidine (TAGAMET) 200 MG tablet Take 200 mg by mouth 2 (two) times daily.    . clonazePAM (KLONOPIN) 0.5 MG tablet Take 0.5 mg by mouth at bedtime as needed.    . diltiazem (CARDIZEM CD) 180 MG 24 hr capsule Take 1 capsule (180 mg total) by mouth daily. 90 capsule 1  . ibuprofen (ADVIL,MOTRIN) 200 MG tablet Take 200 mg by mouth every 4 (four) hours as needed.    Marland Kitchen NITROSTAT 0.4 MG SL tablet Place 0.4 mg under the tongue every 5 (five) minutes as needed.     . nystatin-triamcinolone ointment (MYCOLOG) Apply 1 application topically as needed. 30 g 2  . pantoprazole (PROTONIX) 40 MG tablet Take 40 mg by mouth daily.    . ramipril (ALTACE) 10 MG capsule Take 1 capsule (10 mg total) by mouth daily. 90 capsule 2   No current facility-administered medications on file prior to visit.   Allergies  Allergen Reactions    . Esomeprazole Magnesium Other (See Comments)    cramps  . Septra [Bactrim] Other (See Comments)    Back ache   History   Social History  . Marital Status: Widowed    Spouse Name: N/A  . Number of Children: N/A  . Years of Education: N/A   Occupational History  . communications tech    Social History Main Topics  . Smoking status: Former Smoker -- 1.00 packs/day for 7 years    Types: Cigarettes    Quit date: 11/29/1975  . Smokeless tobacco: Not on file  . Alcohol Use: 1.0 oz/week    2 drink(s) per week  . Drug Use: No  . Sexual Activity: Not on file   Other Topics Concern  . Not on file   Social History Narrative   Family History  Problem Relation Age of Onset  . Cancer Mother 26    cervical  . Cancer Father     lung  . Stroke Brother   . Atrial fibrillation      .p   Review of Systems  All other systems reviewed and are negative.      Objective:   Physical Exam  Constitutional: He is oriented to person, place, and time. He  appears well-developed and well-nourished. No distress.  HENT:  Head: Normocephalic and atraumatic.  Right Ear: External ear normal.  Left Ear: External ear normal.  Nose: Nose normal.  Mouth/Throat: Oropharynx is clear and moist. No oropharyngeal exudate.  Eyes: Conjunctivae and EOM are normal. Pupils are equal, round, and reactive to light. Right eye exhibits no discharge. Left eye exhibits no discharge. No scleral icterus.  Neck: Normal range of motion. Neck supple. No JVD present. No tracheal deviation present. No thyromegaly present.  Cardiovascular: Normal rate, regular rhythm, normal heart sounds and intact distal pulses.  Exam reveals no gallop and no friction rub.   No murmur heard. Pulmonary/Chest: Effort normal and breath sounds normal. No stridor. No respiratory distress. He has no wheezes. He has no rales. He exhibits no tenderness.  Abdominal: Soft. Bowel sounds are normal. He exhibits no distension and no mass. There  is no tenderness. There is no rebound and no guarding.  Genitourinary: Rectum normal and prostate normal.  Musculoskeletal: Normal range of motion. He exhibits no edema or tenderness.  Lymphadenopathy:    He has no cervical adenopathy.  Neurological: He is alert and oriented to person, place, and time. He has normal reflexes. He displays normal reflexes. No cranial nerve deficit. He exhibits normal muscle tone. Coordination normal.  Skin: Skin is warm. No rash noted. He is not diaphoretic. No erythema. No pallor.  Psychiatric: He has a normal mood and affect. His behavior is normal. Judgment and thought content normal.  Vitals reviewed.         Assessment & Plan:  Routine general medical examination at a health care facility - Plan: CBC with Differential/Platelet, COMPLETE METABOLIC PANEL WITH GFR, Lipid panel, PSA, Medicare  patient's physical exam is normal. His blood pressure is excellent. His colonoscopy is up-to-date. His prostate exam is up-to-date. I will check a CBC, CMP, fasting lipid panel, and a PSA. Patient received Pneumovax 23 today in clinic. We did discuss the shingles vaccine but he defers this at the present time until he is able to call his insurance and determine what Price he will have to pay. I will give the patient Prevnar 13 next year.

## 2015-03-17 NOTE — Addendum Note (Signed)
Addended by: Shary Decamp B on: 03/17/2015 09:58 AM   Modules accepted: Orders

## 2015-03-18 LAB — PSA, MEDICARE: PSA: 0.51 ng/mL (ref <=4.00)

## 2015-03-20 LAB — HEMOGLOBIN A1C
Hgb A1c MFr Bld: 5.8 % — ABNORMAL HIGH (ref <5.7)
Mean Plasma Glucose: 120 mg/dL — ABNORMAL HIGH (ref <117)

## 2015-03-25 ENCOUNTER — Encounter: Payer: Self-pay | Admitting: Family Medicine

## 2015-05-11 ENCOUNTER — Other Ambulatory Visit: Payer: Self-pay

## 2015-05-11 MED ORDER — DILTIAZEM HCL ER COATED BEADS 180 MG PO CP24
180.0000 mg | ORAL_CAPSULE | Freq: Every day | ORAL | Status: DC
Start: 2015-05-11 — End: 2015-10-29

## 2015-06-11 DIAGNOSIS — D1801 Hemangioma of skin and subcutaneous tissue: Secondary | ICD-10-CM | POA: Diagnosis not present

## 2015-06-11 DIAGNOSIS — D225 Melanocytic nevi of trunk: Secondary | ICD-10-CM | POA: Diagnosis not present

## 2015-06-11 DIAGNOSIS — L814 Other melanin hyperpigmentation: Secondary | ICD-10-CM | POA: Diagnosis not present

## 2015-06-11 DIAGNOSIS — L821 Other seborrheic keratosis: Secondary | ICD-10-CM | POA: Diagnosis not present

## 2015-09-03 DIAGNOSIS — H25813 Combined forms of age-related cataract, bilateral: Secondary | ICD-10-CM | POA: Diagnosis not present

## 2015-09-03 DIAGNOSIS — H524 Presbyopia: Secondary | ICD-10-CM | POA: Diagnosis not present

## 2015-09-03 DIAGNOSIS — H5203 Hypermetropia, bilateral: Secondary | ICD-10-CM | POA: Diagnosis not present

## 2015-09-03 DIAGNOSIS — H52221 Regular astigmatism, right eye: Secondary | ICD-10-CM | POA: Diagnosis not present

## 2015-09-09 ENCOUNTER — Other Ambulatory Visit: Payer: Self-pay

## 2015-09-09 MED ORDER — CARVEDILOL 12.5 MG PO TABS: 12.5000 mg | ORAL_TABLET | Freq: Two times a day (BID) | ORAL | Status: DC

## 2015-09-17 ENCOUNTER — Ambulatory Visit (INDEPENDENT_AMBULATORY_CARE_PROVIDER_SITE_OTHER): Payer: Medicare Other | Admitting: *Deleted

## 2015-09-17 DIAGNOSIS — Z23 Encounter for immunization: Secondary | ICD-10-CM | POA: Diagnosis not present

## 2015-09-17 NOTE — Progress Notes (Signed)
Patient ID: Walter Cross, male   DOB: June 12, 1951, 64 y.o.   MRN: [id number]  Patient seen in office for Influenza Vaccination.   Tolerated IM administration well.   Immunization history updated.

## 2015-10-29 ENCOUNTER — Ambulatory Visit (INDEPENDENT_AMBULATORY_CARE_PROVIDER_SITE_OTHER): Payer: Medicare Other | Admitting: Cardiovascular Disease

## 2015-10-29 ENCOUNTER — Encounter: Payer: Self-pay | Admitting: Cardiovascular Disease

## 2015-10-29 VITALS — BP 136/74 | HR 65 | Ht 65.0 in | Wt 176.1 lb

## 2015-10-29 DIAGNOSIS — I421 Obstructive hypertrophic cardiomyopathy: Secondary | ICD-10-CM

## 2015-10-29 DIAGNOSIS — I10 Essential (primary) hypertension: Secondary | ICD-10-CM

## 2015-10-29 MED ORDER — SILDENAFIL CITRATE 20 MG PO TABS: ORAL_TABLET | ORAL | Status: DC

## 2015-10-29 MED ORDER — DILTIAZEM HCL ER COATED BEADS 180 MG PO CP24: 180.0000 mg | ORAL_CAPSULE | Freq: Every day | ORAL | Status: DC

## 2015-10-29 NOTE — Patient Instructions (Addendum)
Medication Instructions:  Use sildenafil 20mg  3-5 tablets daily as needed.  [facility] in [place], [place]  Labwork: None today  Testing/Procedures: None today  Follow-Up: Your physician wants you to follow-up in: 1 year with Dr Acie Fredrickson. (December 2017).  You will receive a reminder letter in the mail two months in advance. If you don't receive a letter, please call our office to schedule the follow-up appointment.        If you need a refill on your cardiac medications before your next appointment, please call your pharmacy.

## 2015-10-29 NOTE — Progress Notes (Signed)
Walter Cross Date of Birth  1951/11/28 [facility] [id number] [address]    [place], [place]  [address] [phone]  Fax  [phone]  History of Present Illness:  64 year old gentleman with a history of a mild hypertrophic obstructive cardiomyopathy. He is done very well on medical therapy. He's been on carvedilol and Cardizem. He does not have any episodes of chest pain or shortness breath. He exercises occasionally.  He's looking forward to getting up and walking work this summer and spring.  Apr 23, 2013:  Walter Cross presents with some episodes of chest pain last year. He had a stress Myoview study which was normal. He's had a recent echocardiogram which did not reveal any LVOT gradient  [name] placed him on an acid blocker and he is feeling well.    Nov. 24, 2014:    he was having some CP the last time I saw him.    A stress Myoview study which revealed no evidence of ischemia. He had normal left ventricular systolic function with an ejection fraction of 81%.  His CP have resolved after being started her on an acid blocker by Dr. [name].  Dec. 1, 2015:  Walter Cross is doing well. No cardiac issues.  No further GERD issues.   Admits that he could use a bit more exercise.  Dec. 1, 2016:  Doing great.   Exercising well.  Has vivid dreams - he thinks its due to the coreg    Current Outpatient Prescriptions on File Prior to Visit  Medication Sig Dispense Refill  . aspirin 81 MG tablet Take 81 mg by mouth daily.      . carvedilol (COREG) 12.5 MG tablet Take 1 tablet (12.5 mg total) by mouth 2 (two) times daily with a meal. 60 tablet 3  . Cholecalciferol (VITAMIN D PO) Take 4,000 mg by mouth daily.    . cimetidine (TAGAMET) 200 MG tablet Take 200 mg by mouth 2 (two) times daily.    . clonazePAM (KLONOPIN) 0.5 MG tablet Take 0.5 mg by mouth at bedtime as needed for anxiety (sleep).     Marland Kitchen diltiazem (CARDIZEM CD) 180 MG 24 hr capsule Take 1 capsule (180 mg total) by  mouth daily. 90 capsule 1  . ibuprofen (ADVIL,MOTRIN) 200 MG tablet Take 200 mg by mouth every 4 (four) hours as needed for headache, mild pain or moderate pain.     Marland Kitchen NITROSTAT 0.4 MG SL tablet Place 0.4 mg under the tongue every 5 (five) minutes as needed for chest pain (3 doses max).     . pantoprazole (PROTONIX) 40 MG tablet Take 40 mg by mouth daily.    . ramipril (ALTACE) 10 MG capsule Take 1 capsule (10 mg total) by mouth daily. 90 capsule 3   No current facility-administered medications on file prior to visit.    Allergies  Allergen Reactions  . Esomeprazole Magnesium Other (See Comments)    cramps  . Septra [Bactrim] Other (See Comments)    Back ache    Past Medical History  Diagnosis Date  . Hypertension   . Hyperlipidemia   . GERD (gastroesophageal reflux disease)   . Benign colon polyp   . Genital HSV   . Dermatitis     axillary  . 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 Vitamin D deficiency   . Hypertrophic cardiomyopathy ([place])     a. Echo 2007: mod LVH, diast dysfn, mild MR, no LVOT obstruction;  b.  echo 9/13: upper septal thickening, no SAM of MV, no LVOT gradient, mild LVH, EF 65%  . Hx of cardiovascular stress test     ETT-Myoview 9/13: no ischemia, EF 81%  . Barrett's esophagus   . Adenomatous colon polyp 2015     Buccini (q 5 yrs)     Past Surgical History  Procedure Laterality Date  . Cervical spine surgery  1992-1993    nerve release, Dr Hal Neer  . Spine surgery      L4-L5, 1992-1993    History  Smoking status  . Former Smoker -- 1.00 packs/day for 7 years  . Types: Cigarettes  . Quit date: 11/29/1975  Smokeless tobacco  . Not on file    History  Alcohol Use  . 1.0 oz/week  . 2 drink(s) per week    Family History  Problem Relation Age of Onset  . Cancer Mother 7    cervical  . Cancer Father     lung  . Stroke Brother   . Atrial fibrillation      Reviw of Systems:  Reviewed in the HPI.  All other systems are  negative.  Physical Exam: BP 136/74 mmHg  Pulse 65  Ht 5\' 5"  (1.651 m)  Wt 176 lb 1.9 oz (79.888 kg)  BMI 29.31 kg/m2 The patient is alert and oriented x 3.  The mood and affect are normal.   Skin: warm and dry.  Color is normal.    HEENT:   the sclera are nonicteric.  The mucous membranes are moist.  The carotids are 2+ without bruits.  There is no thyromegaly.  There is no JVD.    Lungs: clear.  The chest wall is non tender.    Heart: regular rate with a normal S1 and S2.  I did not hear a murmur today   Abdomen: good bowel sounds.  There is no guarding or rebound.  There is no hepatosplenomegaly or tenderness.  There are no masses.   Extremities:  no clubbing, cyanosis, or edema.  The legs are without rashes.  The distal pulses are intact.   Neuro:  Cranial nerves II - XII are intact.  Motor and sensory functions are intact.    The gait is normal.  EKG:10/29/2015:  NSR at 64.     Assessment / Plan:   1. HOCM:  Continue current meds. Doing great.   2. ED:  Will give script for Sildenafil 20 mg tabs.     Will see him in 1 year    [name], [name], MD  10/29/2015 9:43 AM    [address],  [place], [place]  [address] Pager [phone] Phone: [phone]; Fax: [phone]   [facility]  [address] [place],   [address] [phone]   Fax [phone]

## 2015-11-12 ENCOUNTER — Telehealth: Payer: Self-pay

## 2015-11-12 NOTE — Telephone Encounter (Signed)
Prior auth for Sildenafil 20 mg tabs sent to [facility].

## 2015-11-13 ENCOUNTER — Telehealth: Payer: Self-pay

## 2015-11-13 NOTE — Telephone Encounter (Signed)
Sidenafil  Denied per [facility]. Patient advised. He may just pay out of pocket.

## 2015-11-24 ENCOUNTER — Ambulatory Visit (INDEPENDENT_AMBULATORY_CARE_PROVIDER_SITE_OTHER): Payer: Medicare Other | Admitting: Family Medicine

## 2015-11-24 ENCOUNTER — Encounter: Payer: Self-pay | Admitting: Family Medicine

## 2015-11-24 VITALS — BP 128/78 | HR 66 | Temp 98.6°F | Resp 14 | Wt 178.0 lb

## 2015-11-24 DIAGNOSIS — J029 Acute pharyngitis, unspecified: Secondary | ICD-10-CM | POA: Diagnosis not present

## 2015-11-24 LAB — RAPID STREP SCREEN (MED CTR MEBANE ONLY): Streptococcus, Group A Screen (Direct): NEGATIVE

## 2015-11-24 NOTE — Progress Notes (Signed)
Subjective:    Patient ID: Walter Cross, male    DOB: [birth year], 64 y.o.   MRN: [id number]  HPI Symptoms began 2 days ago with a sore scratchy throat that was mild. This morning the sore throat worsened. He also reports some postnasal drainage although not severe. He denies any fevers. He denies any trismus. He denies any cough. He denies any sinus pain. He denies any body aches. Strep screen today in office is negative Past Medical History  Diagnosis Date  . Hypertension   . Hyperlipidemia   . GERD (gastroesophageal reflux disease)   . Benign colon polyp   . Genital HSV   . Dermatitis     axillary  . 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 Vitamin D deficiency   . Hypertrophic cardiomyopathy ([place])     a. Echo 2007: mod LVH, diast dysfn, mild MR, no LVOT obstruction;  b. echo 9/13: upper septal thickening, no SAM of MV, no LVOT gradient, mild LVH, EF 65%  . Hx of cardiovascular stress test     ETT-Myoview 9/13: no ischemia, EF 81%  . Barrett's esophagus   . Adenomatous colon polyp 2015     Buccini (q 5 yrs)    Past Surgical History  Procedure Laterality Date  . Cervical spine surgery  1992-1993    nerve release, Dr Hal Neer  . Spine surgery      L4-L5, [phone]   Current Outpatient Prescriptions on File Prior to Visit  Medication Sig Dispense Refill  . acyclovir ointment (ZOVIRAX) 5 % Apply 1 application topically daily as needed (cold sores & herpes).    Marland Kitchen aspirin 81 MG tablet Take 81 mg by mouth daily.      . carvedilol (COREG) 12.5 MG tablet Take 1 tablet (12.5 mg total) by mouth 2 (two) times daily with a meal. 60 tablet 3  . Cholecalciferol (VITAMIN D PO) Take 4,000 mg by mouth daily.    . cimetidine (TAGAMET) 200 MG tablet Take 200 mg by mouth 2 (two) times daily.    . clonazePAM (KLONOPIN) 0.5 MG tablet Take 0.5 mg by mouth at bedtime as needed for anxiety (sleep).     Marland Kitchen diltiazem (CARDIZEM CD) 180 MG 24 hr capsule Take 1 capsule (180 mg total) by mouth  daily. 90 capsule 3  . ibuprofen (ADVIL,MOTRIN) 200 MG tablet Take 200 mg by mouth every 4 (four) hours as needed for headache, mild pain or moderate pain.     Marland Kitchen NITROSTAT 0.4 MG SL tablet Place 0.4 mg under the tongue every 5 (five) minutes as needed for chest pain (3 doses max).     . pantoprazole (PROTONIX) 40 MG tablet Take 40 mg by mouth daily.    . ramipril (ALTACE) 10 MG capsule Take 1 capsule (10 mg total) by mouth daily. 90 capsule 3  . sildenafil (REVATIO) 20 MG tablet 3-5 tablets daily as needed 50 tablet 3   No current facility-administered medications on file prior to visit.   Allergies  Allergen Reactions  . Esomeprazole Magnesium Other (See Comments)    cramps  . Septra [Bactrim] Other (See Comments)    Back ache   Social History   Social History  . Marital Status: Widowed    Spouse Name: N/A  . Number of Children: N/A  . Years of Education: N/A   Occupational History  . communications tech    Social History Main Topics  . Smoking status: Former Smoker --  1.00 packs/day for 7 years    Types: Cigarettes    Quit date: 11/29/1975  . Smokeless tobacco: Not on file  . Alcohol Use: 1.0 oz/week    2 drink(s) per week  . Drug Use: No  . Sexual Activity: Not on file   Other Topics Concern  . Not on file   Social History Narrative      Review of Systems  All other systems reviewed and are negative.      Objective:   Physical Exam  Constitutional: He appears well-developed and well-nourished. No distress.  HENT:  Right Ear: External ear normal.  Left Ear: External ear normal.  Nose: Nose normal.  Mouth/Throat: Oropharynx is clear and moist. No oropharyngeal exudate.  Eyes: Conjunctivae are normal. Pupils are equal, round, and reactive to light.  Neck: Neck supple.  Cardiovascular: Normal rate, regular rhythm and normal heart sounds.   Pulmonary/Chest: Effort normal and breath sounds normal.  Lymphadenopathy:    He has no cervical adenopathy.  Skin:  He is not diaphoretic.  Vitals reviewed.         Assessment & Plan:  Sore throat - Plan: Rapid strep screen (not at [facility])  Symptoms are consistent with a viral upper respiratory infection/viral pharyngitis. I have recommended over-the-counter Chloraseptic or Cepacol lozenges as needed for sore throat. If symptoms worsen he is to notify me immediately however I anticipate symptoms should resolve over 5 days

## 2016-01-14 ENCOUNTER — Other Ambulatory Visit: Payer: Self-pay | Admitting: Cardiovascular Disease

## 2016-01-14 MED ORDER — CARVEDILOL 12.5 MG PO TABS: 12.5000 mg | ORAL_TABLET | Freq: Two times a day (BID) | ORAL | Status: DC

## 2016-03-09 ENCOUNTER — Other Ambulatory Visit: Payer: Self-pay | Admitting: Family Medicine

## 2016-03-09 MED ORDER — RAMIPRIL 10 MG PO CAPS: 10.0000 mg | ORAL_CAPSULE | Freq: Every day | ORAL | Status: DC

## 2016-03-18 ENCOUNTER — Telehealth: Payer: Self-pay | Admitting: Family Medicine

## 2016-03-18 ENCOUNTER — Ambulatory Visit
Admission: RE | Admit: 2016-03-18 | Discharge: 2016-03-18 | Disposition: A | Discharge status: Home / Self Care | Payer: Medicare Other | Source: Ambulatory Visit | Attending: Family Medicine | Admitting: Family Medicine

## 2016-03-18 ENCOUNTER — Ambulatory Visit (INDEPENDENT_AMBULATORY_CARE_PROVIDER_SITE_OTHER): Payer: Medicare Other | Admitting: Family Medicine

## 2016-03-18 ENCOUNTER — Encounter: Payer: Self-pay | Admitting: Family Medicine

## 2016-03-18 VITALS — BP 132/78 | HR 60 | Temp 98.1°F | Resp 16 | Ht 69.0 in | Wt 178.0 lb

## 2016-03-18 DIAGNOSIS — Z0000 Encounter for general adult medical examination without abnormal findings: Secondary | ICD-10-CM | POA: Diagnosis not present

## 2016-03-18 DIAGNOSIS — Z23 Encounter for immunization: Secondary | ICD-10-CM | POA: Diagnosis not present

## 2016-03-18 DIAGNOSIS — R05 Cough: Secondary | ICD-10-CM

## 2016-03-18 DIAGNOSIS — R059 Cough, unspecified: Secondary | ICD-10-CM

## 2016-03-18 LAB — COMPLETE METABOLIC PANEL WITH GFR
ALT: 33 U/L (ref 9–46)
AST: 27 U/L (ref 10–35)
Albumin: 4 g/dL (ref 3.6–5.1)
Alkaline Phosphatase: 64 U/L (ref 40–115)
BUN: 11 mg/dL (ref 7–25)
CO2: 25 mmol/L (ref 20–31)
Calcium: 9 mg/dL (ref 8.6–10.3)
Chloride: 103 mmol/L (ref 98–110)
Creat: 0.92 mg/dL (ref 0.70–1.25)
GFR, Est African American: >89 mL/min (ref >=60)
GFR, Est Non African American: 86 mL/min (ref >=60)
Glucose, Bld: 110 mg/dL — ABNORMAL HIGH (ref 70–99)
Potassium: 4.3 mmol/L (ref 3.5–5.3)
Sodium: 138 mmol/L (ref 135–146)
Total Bilirubin: 0.8 mg/dL (ref 0.2–1.2)
Total Protein: 6.5 g/dL (ref 6.1–8.1)

## 2016-03-18 LAB — CBC WITH DIFFERENTIAL/PLATELET
Basophils Absolute: 0 cells/uL (ref 0–200)
Basophils Relative: 0 %
Eosinophils Absolute: 96 cells/uL (ref 15–500)
Eosinophils Relative: 2 %
HCT: 44.9 % (ref 38.5–50.0)
Hemoglobin: 15.3 g/dL (ref 13.0–17.0)
Lymphocytes Relative: 23 %
Lymphs Abs: 1104 cells/uL (ref 850–3900)
MCH: 33.1 pg — ABNORMAL HIGH (ref 27.0–33.0)
MCHC: 34.1 g/dL (ref 32.0–36.0)
MCV: 97.2 fL (ref 80.0–100.0)
MPV: 10.5 fL (ref 7.5–12.5)
Monocytes Absolute: 720 cells/uL (ref 200–950)
Monocytes Relative: 15 %
Neutro Abs: 2880 cells/uL (ref 1500–7800)
Neutrophils Relative %: 60 %
Platelets: 188 10*3/uL (ref 140–400)
RBC: 4.62 MIL/uL (ref 4.20–5.80)
RDW: 13.6 % (ref 11.0–15.0)
WBC: 4.8 10*3/uL (ref 3.8–10.8)

## 2016-03-18 LAB — HEPATITIS C ANTIBODY: HCV Ab: NEGATIVE

## 2016-03-18 LAB — LIPID PANEL
Cholesterol: 208 mg/dL — ABNORMAL HIGH (ref 125–200)
HDL: 68 mg/dL (ref >=40)
LDL Cholesterol: 121 mg/dL (ref <130)
Total CHOL/HDL Ratio: 3.1 Ratio (ref <=5.0)
Triglycerides: 93 mg/dL (ref <150)
VLDL: 19 mg/dL (ref <30)

## 2016-03-18 IMAGING — CR DG CHEST 2V
2 series · 2 of 2 positions shown · non-contrast
Comparison: [DATE]

CLINICAL DATA: Dry cough for a few days. History of asbestos
exposure.

EXAM:
CHEST  2 VIEW

[view not recorded (1 of 2)]
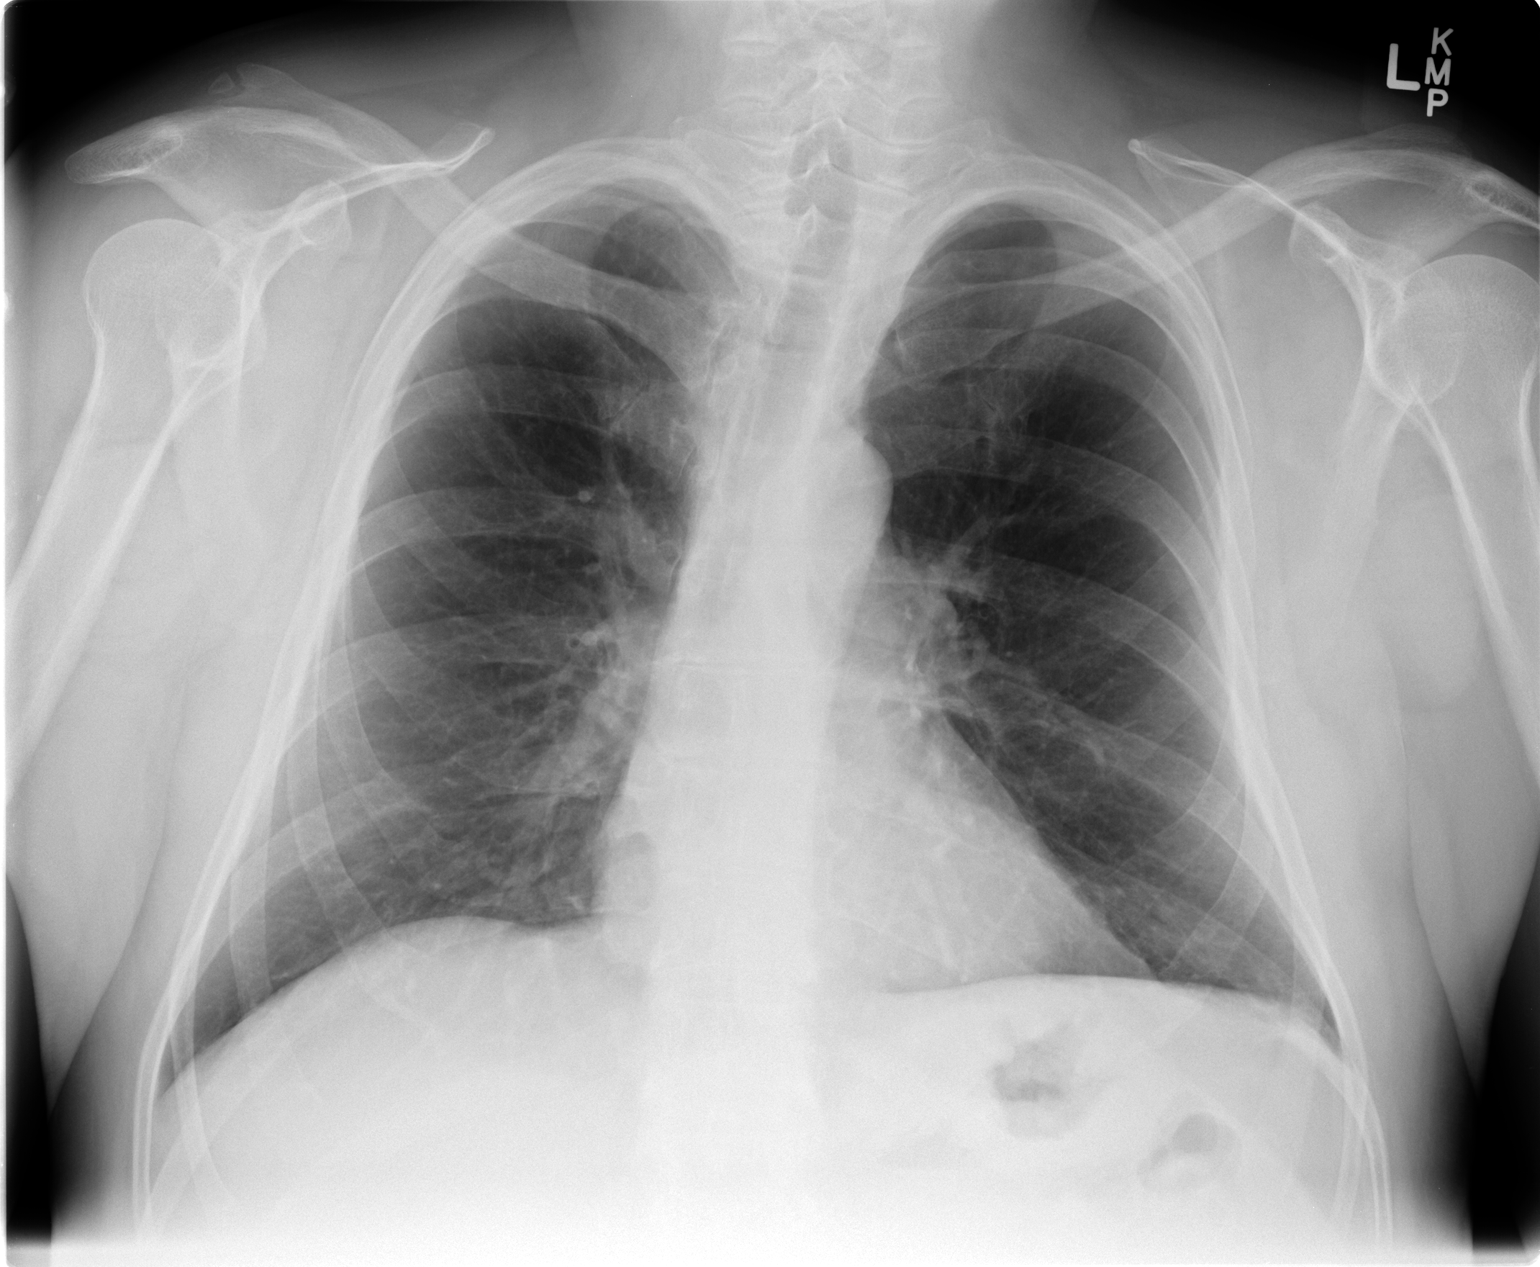

[view not recorded (2 of 2)]
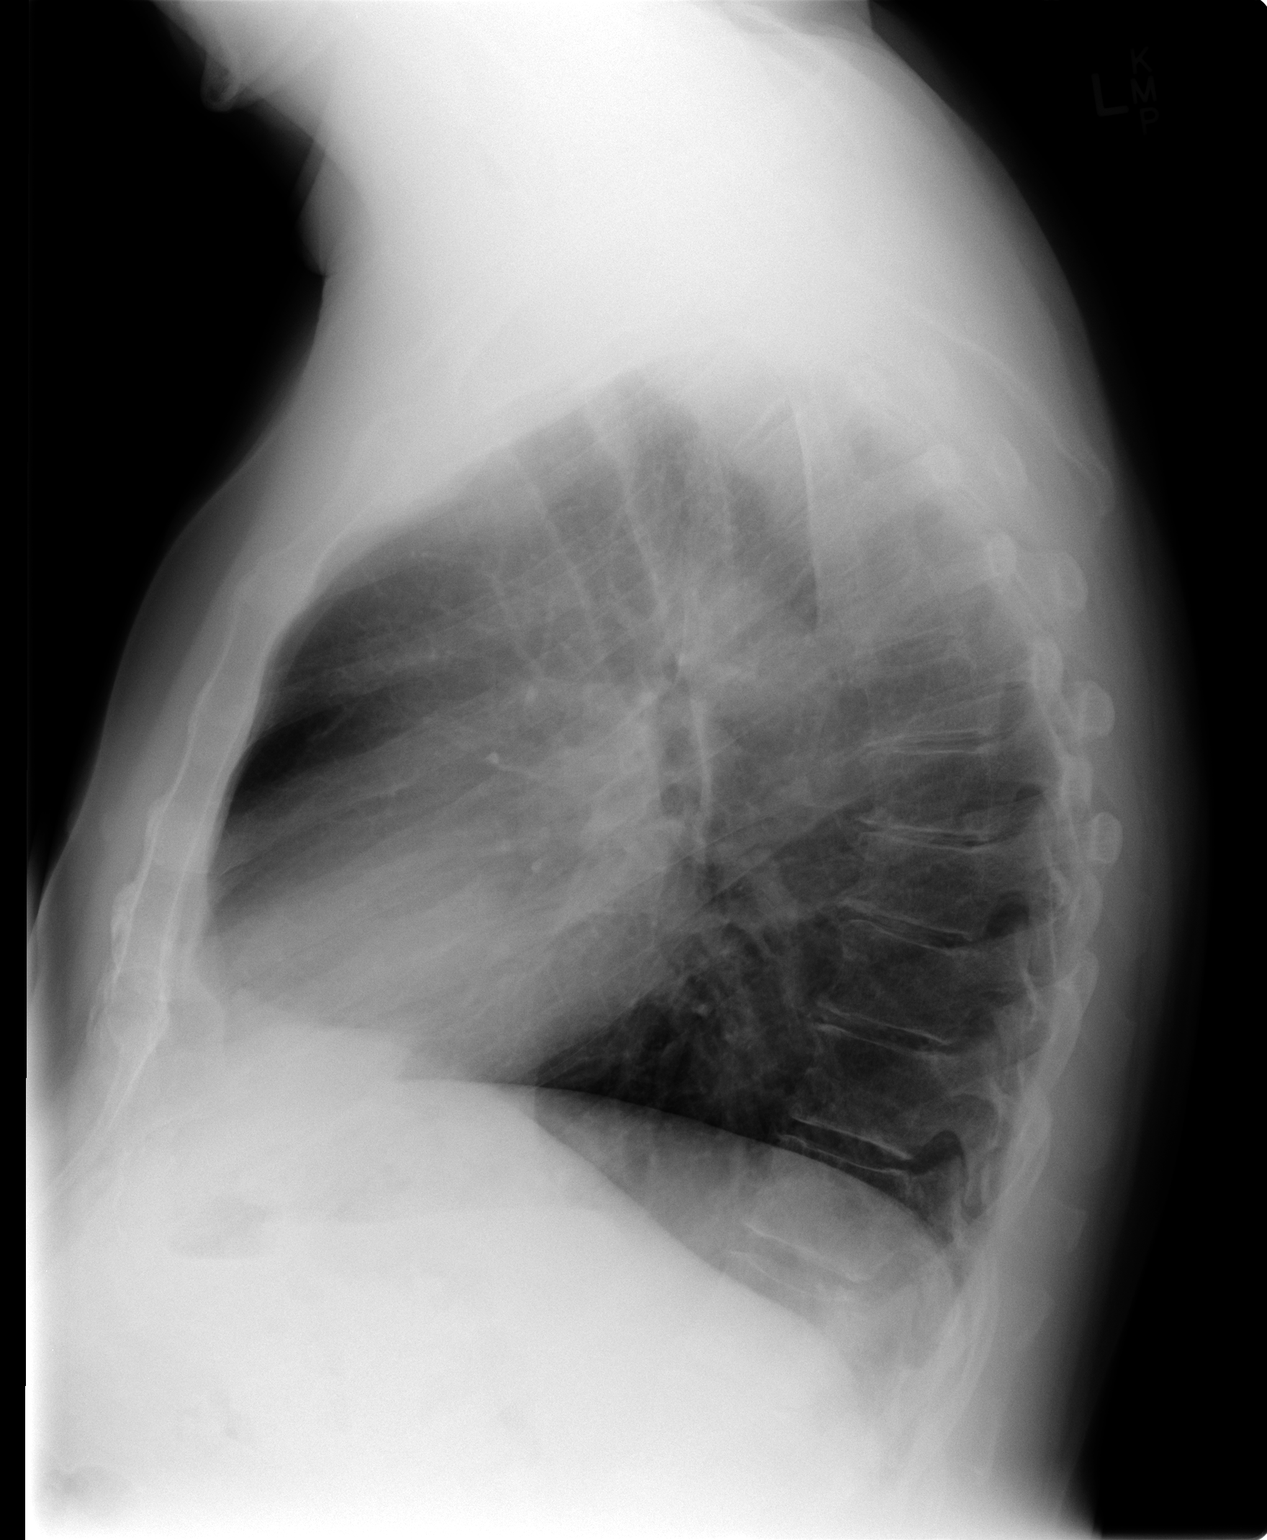

[2 of 2 positions shown; findings below may reference images not displayed]

FINDINGS: The heart size and mediastinal contours are within normal limits.
Both lungs are clear. The visualized skeletal structures are
unremarkable.
IMPRESSION: No active cardiopulmonary disease.

## 2016-03-18 MED ORDER — CLONAZEPAM 0.5 MG PO TABS: 0.5000 mg | ORAL_TABLET | Freq: Every evening | ORAL | Status: DC | PRN

## 2016-03-18 MED ORDER — NYSTATIN-TRIAMCINOLONE 100000-0.1 UNIT/GM-% EX OINT: 1.0000 "application " | TOPICAL_OINTMENT | Freq: Two times a day (BID) | CUTANEOUS | Status: DC

## 2016-03-18 MED ORDER — ACYCLOVIR 5 % EX OINT: 1.0000 "application " | TOPICAL_OINTMENT | Freq: Every day | CUTANEOUS | Status: AC | PRN

## 2016-03-18 NOTE — Telephone Encounter (Signed)
ok 

## 2016-03-18 NOTE — Telephone Encounter (Signed)
Medication called/sent to requested pharmacy  

## 2016-03-18 NOTE — Telephone Encounter (Signed)
Patient forgot to ask for a refill on his acyclovir ointment (ZOVIRAX) 5 % and his clonazePAM (KLONOPIN) 0.5 MG tablet  [facility] on [address]   CB# [phone]

## 2016-03-18 NOTE — Progress Notes (Signed)
Subjective:    Patient ID: Walter Cross, male    DOB: 1951/10/27, 65 y.o.   MRN: [id number]  HPI  Patient is here today for complete physical exam. He has no concerns. His blood pressures well controlled 132/78. He is due for his annual prostate exam. He is due for a PSA. His colonoscopy was performed in 2015 and was normal. He is due for Prevnar 13 next year. We did discuss the shingles vaccine but he would like to call his insurance first prior to receiving the vaccine.  He also has had a cough and given his previous exposure to his best this would like to get a chest x-ray. He also complains of excessive daytime somnolence. He does snore. Certainly sleep apnea as a possibility. Past Surgical History  Procedure Laterality Date  . Cervical spine surgery  1992-1993    nerve release, Dr Hal Neer  . Spine surgery      L4-L5, [phone]   Current Outpatient Prescriptions on File Prior to Visit  Medication Sig Dispense Refill  . acyclovir ointment (ZOVIRAX) 5 % Apply 1 application topically daily as needed (cold sores & herpes).    Marland Kitchen aspirin 81 MG tablet Take 81 mg by mouth daily.      . carvedilol (COREG) 12.5 MG tablet Take 1 tablet (12.5 mg total) by mouth 2 (two) times daily with a meal. 60 tablet 10  . Cholecalciferol (VITAMIN D PO) Take 4,000 mg by mouth daily.    . cimetidine (TAGAMET) 200 MG tablet Take 200 mg by mouth 2 (two) times daily.    . clonazePAM (KLONOPIN) 0.5 MG tablet Take 0.5 mg by mouth at bedtime as needed for anxiety (sleep).     Marland Kitchen diltiazem (CARDIZEM CD) 180 MG 24 hr capsule Take 1 capsule (180 mg total) by mouth daily. 90 capsule 3  . ibuprofen (ADVIL,MOTRIN) 200 MG tablet Take 200 mg by mouth every 4 (four) hours as needed for headache, mild pain or moderate pain.     Marland Kitchen NITROSTAT 0.4 MG SL tablet Place 0.4 mg under the tongue every 5 (five) minutes as needed for chest pain (3 doses max).     . pantoprazole (PROTONIX) 40 MG tablet Take 40 mg by mouth daily.    .  ramipril (ALTACE) 10 MG capsule Take 1 capsule (10 mg total) by mouth daily. 90 capsule 3  . sildenafil (REVATIO) 20 MG tablet 3-5 tablets daily as needed 50 tablet 3   No current facility-administered medications on file prior to visit.   Past Medical History  Diagnosis Date  . Hypertension   . Hyperlipidemia   . GERD (gastroesophageal reflux disease)   . Benign colon polyp   . Genital HSV   . Dermatitis     axillary  . 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 Vitamin D deficiency   . Hypertrophic cardiomyopathy ([place])     a. Echo 2007: mod LVH, diast dysfn, mild MR, no LVOT obstruction;  b. echo 9/13: upper septal thickening, no SAM of MV, no LVOT gradient, mild LVH, EF 65%  . Hx of cardiovascular stress test     ETT-Myoview 9/13: no ischemia, EF 81%  . Barrett's esophagus   . Adenomatous colon polyp 2015     Buccini (q 5 yrs)     Allergies  Allergen Reactions  . Esomeprazole Magnesium Other (See Comments)    cramps  . Septra [Bactrim] Other (See Comments)    Back ache  Social History   Social History  . Marital Status: Widowed    Spouse Name: N/A  . Number of Children: N/A  . Years of Education: N/A   Occupational History  . communications tech    Social History Main Topics  . Smoking status: Former Smoker -- 1.00 packs/day for 7 years    Types: Cigarettes    Quit date: 11/29/1975  . Smokeless tobacco: Not on file  . Alcohol Use: 1.0 oz/week    2 drink(s) per week  . Drug Use: No  . Sexual Activity: Not on file   Other Topics Concern  . Not on file   Social History Narrative   Family History  Problem Relation Age of Onset  . Cancer Mother 36    cervical  . Cancer Father     lung  . Stroke Brother   . Atrial fibrillation      .p   Review of Systems  All other systems reviewed and are negative.      Objective:   Physical Exam  Constitutional: He is oriented to person, place, and time. He appears well-developed and well-nourished. No  distress.  HENT:  Head: Normocephalic and atraumatic.  Right Ear: External ear normal.  Left Ear: External ear normal.  Nose: Nose normal.  Mouth/Throat: Oropharynx is clear and moist. No oropharyngeal exudate.  Eyes: Conjunctivae and EOM are normal. Pupils are equal, round, and reactive to light. Right eye exhibits no discharge. Left eye exhibits no discharge. No scleral icterus.  Neck: Normal range of motion. Neck supple. No JVD present. No tracheal deviation present. No thyromegaly present.  Cardiovascular: Normal rate, regular rhythm, normal heart sounds and intact distal pulses.  Exam reveals no gallop and no friction rub.   No murmur heard. Pulmonary/Chest: Effort normal and breath sounds normal. No stridor. No respiratory distress. He has no wheezes. He has no rales. He exhibits no tenderness.  Abdominal: Soft. Bowel sounds are normal. He exhibits no distension and no mass. There is no tenderness. There is no rebound and no guarding.  Genitourinary: Rectum normal and prostate normal.  Musculoskeletal: Normal range of motion. He exhibits no edema or tenderness.  Lymphadenopathy:    He has no cervical adenopathy.  Neurological: He is alert and oriented to person, place, and time. He has normal reflexes. No cranial nerve deficit. He exhibits normal muscle tone. Coordination normal.  Skin: Skin is warm. No rash noted. He is not diaphoretic. No erythema. No pallor.  Psychiatric: He has a normal mood and affect. His behavior is normal. Judgment and thought content normal.  Vitals reviewed.         Assessment & Plan:  Cough - Plan: DG Chest 2 View  Routine general medical examination at a health care facility - Plan: CBC with Differential/Platelet, COMPLETE METABOLIC PANEL WITH GFR, Lipid panel, PSA, Hepatitis C Ab Reflex HCV RNA, QUANT  patient's physical exam is normal. His blood pressure is excellent. His colonoscopy is up-to-date. His prostate exam is up-to-date. I will check a  CBC, CMP, fasting lipid panel, and a PSA. The screen the patient for hepatitis C given his age. We will obtain a chest x-ray at his request. I recommended switching from Tagamet to Zantac to avoid medication interactions. He received Prevnar 13 today in office.

## 2016-03-18 NOTE — Addendum Note (Signed)
Addended by: Shary Decamp B on: 03/18/2016 09:44 AM   Modules accepted: Orders

## 2016-03-18 NOTE — Telephone Encounter (Signed)
Ok to refill 

## 2016-03-19 LAB — PSA: PSA: 0.45 ng/mL (ref <=4.00)

## 2016-03-21 ENCOUNTER — Telehealth: Payer: Self-pay | Admitting: *Deleted

## 2016-03-21 NOTE — Telephone Encounter (Signed)
Received request from pharmacy for PA on acyclovir ointment.   PA submitted.   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

## 2016-03-24 MED ORDER — VALACYCLOVIR HCL 1 G PO TABS: 1000.0000 mg | ORAL_TABLET | Freq: Two times a day (BID) | ORAL | Status: DC

## 2016-03-24 NOTE — Telephone Encounter (Signed)
Received PA determination.   PA has been denied.   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  MD please advise.

## 2016-03-24 NOTE — Telephone Encounter (Signed)
Tell him insurance will not cover the medication now for the reasons that you stated.  However he can take Valtrex 1000 mg tablets 2 by mouth twice a day whenever he gets a cold sore. He would only have to do that for 1 day

## 2016-03-24 NOTE — Telephone Encounter (Signed)
Call placed to patient and patient made aware.   Prescription sent to pharmacy.  

## 2016-06-07 DIAGNOSIS — Z86010 Personal history of colonic polyps: Secondary | ICD-10-CM | POA: Diagnosis not present

## 2016-06-07 DIAGNOSIS — K219 Gastro-esophageal reflux disease without esophagitis: Secondary | ICD-10-CM | POA: Diagnosis not present

## 2016-06-07 DIAGNOSIS — Z79899 Other long term (current) drug therapy: Secondary | ICD-10-CM | POA: Diagnosis not present

## 2016-06-07 DIAGNOSIS — K2270 Barrett's esophagus without dysplasia: Secondary | ICD-10-CM | POA: Diagnosis not present

## 2016-06-09 DIAGNOSIS — Z79899 Other long term (current) drug therapy: Secondary | ICD-10-CM | POA: Diagnosis not present

## 2016-06-09 DIAGNOSIS — L304 Erythema intertrigo: Secondary | ICD-10-CM | POA: Diagnosis not present

## 2016-06-09 DIAGNOSIS — Z86010 Personal history of colonic polyps: Secondary | ICD-10-CM | POA: Diagnosis not present

## 2016-06-09 DIAGNOSIS — D225 Melanocytic nevi of trunk: Secondary | ICD-10-CM | POA: Diagnosis not present

## 2016-06-09 DIAGNOSIS — D1801 Hemangioma of skin and subcutaneous tissue: Secondary | ICD-10-CM | POA: Diagnosis not present

## 2016-06-09 DIAGNOSIS — L718 Other rosacea: Secondary | ICD-10-CM | POA: Diagnosis not present

## 2016-06-09 DIAGNOSIS — L814 Other melanin hyperpigmentation: Secondary | ICD-10-CM | POA: Diagnosis not present

## 2016-06-09 DIAGNOSIS — K2270 Barrett's esophagus without dysplasia: Secondary | ICD-10-CM | POA: Diagnosis not present

## 2016-06-09 DIAGNOSIS — L821 Other seborrheic keratosis: Secondary | ICD-10-CM | POA: Diagnosis not present

## 2016-06-09 DIAGNOSIS — D485 Neoplasm of uncertain behavior of skin: Secondary | ICD-10-CM | POA: Diagnosis not present

## 2016-09-09 DIAGNOSIS — D2221 Melanocytic nevi of right ear and external auricular canal: Secondary | ICD-10-CM | POA: Diagnosis not present

## 2016-09-09 DIAGNOSIS — D485 Neoplasm of uncertain behavior of skin: Secondary | ICD-10-CM | POA: Diagnosis not present

## 2016-09-09 DIAGNOSIS — L918 Other hypertrophic disorders of the skin: Secondary | ICD-10-CM | POA: Diagnosis not present

## 2016-09-13 ENCOUNTER — Ambulatory Visit (INDEPENDENT_AMBULATORY_CARE_PROVIDER_SITE_OTHER): Payer: Medicare Other

## 2016-09-13 DIAGNOSIS — Z23 Encounter for immunization: Secondary | ICD-10-CM

## 2016-09-30 DIAGNOSIS — E538 Deficiency of other specified B group vitamins: Secondary | ICD-10-CM | POA: Diagnosis not present

## 2016-10-18 ENCOUNTER — Encounter: Payer: Self-pay | Admitting: Cardiovascular Disease

## 2016-10-19 ENCOUNTER — Encounter: Payer: Self-pay | Admitting: Cardiovascular Disease

## 2016-10-25 ENCOUNTER — Other Ambulatory Visit: Payer: Self-pay | Admitting: *Deleted

## 2016-10-25 MED ORDER — CARVEDILOL 12.5 MG PO TABS: 12.5000 mg | ORAL_TABLET | Freq: Two times a day (BID) | ORAL | 0 refills | Status: DC

## 2016-10-25 NOTE — Telephone Encounter (Signed)
Per call from patient his insurance is requiring a ninety day rx.

## 2016-10-31 ENCOUNTER — Ambulatory Visit: Payer: Self-pay | Admitting: Cardiovascular Disease

## 2016-11-01 ENCOUNTER — Encounter: Payer: Self-pay | Admitting: Family Medicine

## 2016-11-01 ENCOUNTER — Ambulatory Visit (INDEPENDENT_AMBULATORY_CARE_PROVIDER_SITE_OTHER): Payer: Medicare Other | Admitting: Family Medicine

## 2016-11-01 VITALS — BP 150/72 | HR 80 | Temp 98.8°F | Resp 16 | Ht 69.0 in | Wt 178.0 lb

## 2016-11-01 DIAGNOSIS — M79671 Pain in right foot: Secondary | ICD-10-CM

## 2016-11-01 MED ORDER — PREDNISONE 20 MG PO TABS: ORAL_TABLET | ORAL | 0 refills | Status: DC

## 2016-11-01 MED ORDER — COLCHICINE 0.6 MG PO CAPS: ORAL_CAPSULE | ORAL | 0 refills | Status: DC

## 2016-11-01 NOTE — Addendum Note (Signed)
Addended by: Shary Decamp B on: 11/01/2016 04:54 PM   Modules accepted: Orders

## 2016-11-01 NOTE — Progress Notes (Signed)
Subjective:    Patient ID: Walter Cross, male    DOB: [birth year], 65 y.o.   MRN: [id number]  HPI Patient awoke Sunday morning with the sudden onset of pain in the dorsum of his right midfoot. He denies injuring the foot Saturday. Some of the right midfoot is pink and slightly erythematous. It is warm to the touch. It is slightly swollen. However the swelling seems to be an effusion in the joints of the midfoot rather than subcutaneous swelling. The patient has pain with weightbearing. He is exquisitely tender to touch in that area. He even has pain with flexion and extension of the right first MTP joint. Pain is out of proportion to exam. All this raises concern for gout. He denies any penetrating trauma or other injury to put him at risk for septic arthritis. The slight pink erythema is not consistent with cellulitis. The foot is not red. Past Medical History:  Diagnosis Date  . Adenomatous colon polyp 2015    Buccini (q 5 yrs)   . Barrett's esophagus   . Benign colon polyp   . Dermatitis    axillary  . 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 Genital HSV   . GERD (gastroesophageal reflux disease)   . Hx of cardiovascular stress test    ETT-Myoview 9/13: no ischemia, EF 81%  . Hyperlipidemia   . Hypertension   . Hypertrophic cardiomyopathy ([place])    a. Echo 2007: mod LVH, diast dysfn, mild MR, no LVOT obstruction;  b. echo 9/13: upper septal thickening, no SAM of MV, no LVOT gradient, mild LVH, EF 65%  . Vitamin D deficiency    Past Surgical History:  Procedure Laterality Date  . CERVICAL SPINE SURGERY  [phone]   nerve release, Dr Hal Neer  . SPINE SURGERY     L4-L5, [phone]   Current Outpatient Prescriptions on File Prior to Visit  Medication Sig Dispense Refill  . acyclovir ointment (ZOVIRAX) 5 % Apply 1 application topically daily as needed (cold sores & herpes). 15 g 1  . aspirin 81 MG tablet Take 81 mg by mouth daily.      . carvedilol (COREG) 12.5 MG tablet  Take 1 tablet (12.5 mg total) by mouth 2 (two) times daily with a meal. 180 tablet 0  . Cholecalciferol (VITAMIN D PO) Take 4,000 mg by mouth daily.    . cimetidine (TAGAMET) 200 MG tablet Take 200 mg by mouth 2 (two) times daily.    . clonazePAM (KLONOPIN) 0.5 MG tablet Take 1 tablet (0.5 mg total) by mouth at bedtime as needed for anxiety (sleep). 30 tablet 2  . diltiazem (CARDIZEM CD) 180 MG 24 hr capsule Take 1 capsule (180 mg total) by mouth daily. 90 capsule 3  . ibuprofen (ADVIL,MOTRIN) 200 MG tablet Take 200 mg by mouth every 4 (four) hours as needed for headache, mild pain or moderate pain.     Marland Kitchen NITROSTAT 0.4 MG SL tablet Place 0.4 mg under the tongue every 5 (five) minutes as needed for chest pain (3 doses max).     . nystatin-triamcinolone (MYCOLOG II) cream Apply 1 application topically daily as needed.    . nystatin-triamcinolone ointment (MYCOLOG) Apply 1 application topically 2 (two) times daily. 30 g 5  . pantoprazole (PROTONIX) 40 MG tablet Take 40 mg by mouth daily.    . ramipril (ALTACE) 10 MG capsule Take 1 capsule (10 mg total) by mouth daily. 90 capsule 3  . sildenafil (REVATIO)  20 MG tablet 3-5 tablets daily as needed 50 tablet 3  . valACYclovir (VALTREX) 1000 MG tablet Take 1 tablet (1,000 mg total) by mouth 2 (two) times daily. 4 tablet 1   No current facility-administered medications on file prior to visit.    Allergies  Allergen Reactions  . Esomeprazole Magnesium Other (See Comments)    cramps  . Septra [Bactrim] Other (See Comments)    Back ache   Social History   Social History  . Marital status: Widowed    Spouse name: N/A  . Number of children: N/A  . Years of education: N/A   Occupational History  . 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 Smoking status: Former Smoker    Packs/day: 1.00    Years: 7.00    Types: Cigarettes    Quit date: 11/29/1975  . Smokeless tobacco: Never Used  . Alcohol use 1.0  oz/week    2 Standard drinks or equivalent per week  . Drug use: No  . Sexual activity: Not on file   Other Topics Concern  . Not on file   Social History Narrative  . No narrative on file      Review of Systems  All other systems reviewed and are negative.      Objective:   Physical Exam  Cardiovascular: Normal rate, regular rhythm and normal heart sounds.   Pulmonary/Chest: Effort normal and breath sounds normal.  Musculoskeletal:       Right foot: There is decreased range of motion, tenderness, bony tenderness and swelling.       Feet:  Vitals reviewed.         Assessment & Plan:  Pain of midfoot, right - Plan: Uric acid  I believe the patient is having a gout exacerbation. I will check a uric acid level. I recommended colchicine 0.6 mg tablets, 2 tablets immediately, repeat 1 tablet 1 hour if pain persists. He can repeat this process daily into the pain improves. The pain is not better after about 48 hours, if the uric acid levels normal, or if the pain intensifies, proceed with imaging of the foot. Keep infectious etiologies in the differential diagnosis

## 2016-11-02 LAB — URIC ACID: Uric Acid, Serum: 8 mg/dL (ref 4.0–8.0)

## 2016-11-04 ENCOUNTER — Ambulatory Visit (INDEPENDENT_AMBULATORY_CARE_PROVIDER_SITE_OTHER): Payer: Medicare Other | Admitting: Cardiovascular Disease

## 2016-11-04 ENCOUNTER — Encounter: Payer: Self-pay | Admitting: Cardiovascular Disease

## 2016-11-04 DIAGNOSIS — I10 Essential (primary) hypertension: Secondary | ICD-10-CM | POA: Diagnosis not present

## 2016-11-04 DIAGNOSIS — I421 Obstructive hypertrophic cardiomyopathy: Secondary | ICD-10-CM | POA: Diagnosis not present

## 2016-11-04 MED ORDER — DILTIAZEM HCL ER COATED BEADS 180 MG PO CP24: 180.0000 mg | ORAL_CAPSULE | Freq: Every day | ORAL | 3 refills | Status: DC

## 2016-11-04 NOTE — Patient Instructions (Signed)
Medication Instructions:  Your physician recommends that you continue on your current medications as directed. Please refer to the Current Medication list given to you today.   Labwork: none  Testing/Procedures: none  Follow-Up: Your physician wants you to follow-up in: 12 months with Dr. Acie Fredrickson. You will receive a reminder letter in the mail two months in advance. If you don't receive a letter, please call our office to schedule the follow-up appointment.   Any Other Special Instructions Will Be Listed Below (If Applicable).     If you need a refill on your cardiac medications before your next appointment, please call your pharmacy.

## 2016-11-04 NOTE — Progress Notes (Signed)
Walter Cross Date of Birth  22-May-1951 [facility] [id number] [address]. [address]    [place], [place]  [address] [phone]  Fax  [phone]   65 year old gentleman with a history of a mild hypertrophic obstructive cardiomyopathy. He is done very well on medical therapy. He's been on carvedilol and Cardizem. He does not have any episodes of chest pain or shortness breath. He exercises occasionally.  He's looking forward to getting up and walking work this summer and spring.  Apr 23, 2013:  Walter Cross presents with some episodes of chest pain last year. He had a stress Myoview study which was normal. He's had a recent echocardiogram which did not reveal any LVOT gradient  Walter Cross placed him on an acid blocker and he is feeling well.    Nov. 24, 2014:    he was having some CP the last time I saw him.    A stress Myoview study which revealed no evidence of ischemia. He had normal left ventricular systolic function with an ejection fraction of 81%.  His CP have resolved after being started her on an acid blocker by Dr. Delma Cross.  Dec. 1, 2015:  Walter Cross is doing well. No cardiac issues.  No further GERD issues.   Admits that he could use a bit more exercise.  Dec. 1, 2016:  Doing great.   Exercising well.  Has vivid dreams - he thinks its due to the coreg   Dec. 8, 2017:  Doing well.   BP is slightly elevated but typically is normal . Avoids salt .    Not exercising much .    No CP or dyspnea.     Current Outpatient Prescriptions on File Prior to Visit  Medication Sig Dispense Refill  . acyclovir ointment (ZOVIRAX) 5 % Apply 1 application topically daily as needed (cold sores & herpes). 15 g 1  . aspirin 81 MG tablet Take 81 mg by mouth daily.      . carvedilol (COREG) 12.5 MG tablet Take 1 tablet (12.5 mg total) by mouth 2 (two) times daily with a meal. 180 tablet 0  . Cholecalciferol (VITAMIN D PO) Take 4,000 mg by mouth daily.    . clonazePAM (KLONOPIN) 0.5 MG tablet  Take 1 tablet (0.5 mg total) by mouth at bedtime as needed for anxiety (sleep). 30 tablet 2  . diltiazem (CARDIZEM CD) 180 MG 24 hr capsule Take 1 capsule (180 mg total) by mouth daily. 90 capsule 3  . ibuprofen (ADVIL,MOTRIN) 200 MG tablet Take 200 mg by mouth every 4 (four) hours as needed for headache, mild pain or moderate pain.     Marland Kitchen NITROSTAT 0.4 MG SL tablet Place 0.4 mg under the tongue every 5 (five) minutes as needed for chest pain (3 doses max).     . nystatin-triamcinolone ointment (MYCOLOG) Apply 1 application topically 2 (two) times daily. 30 g 5  . pantoprazole (PROTONIX) 40 MG tablet Take 40 mg by mouth daily.    . predniSONE (DELTASONE) 20 MG tablet 3 tabs po qd x 2 days, 2 tabs po qd x 2 days, 1 tab po qd x 2 days 12 tablet 0  . ramipril (ALTACE) 10 MG capsule Take 1 capsule (10 mg total) by mouth daily. 90 capsule 3  . sildenafil (REVATIO) 20 MG tablet 3-5 tablets daily as needed 50 tablet 3  . valACYclovir (VALTREX) 1000 MG tablet Take 1 tablet (1,000 mg total) by mouth 2 (two) times daily. 4 tablet 1  No current facility-administered medications on file prior to visit.     Allergies  Allergen Reactions  . Esomeprazole Magnesium Other (See Comments)    cramps  . Septra [Bactrim] Other (See Comments)    Back ache    Past Medical History:  Diagnosis Date  . Adenomatous colon polyp 2015    Cross (q 5 yrs)   . Barrett's esophagus   . Benign colon polyp   . Dermatitis    axillary  . 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 Genital HSV   . GERD (gastroesophageal reflux disease)   . Hx of cardiovascular stress test    ETT-Myoview 9/13: no ischemia, EF 81%  . Hyperlipidemia   . Hypertension   . Hypertrophic cardiomyopathy ([place])    a. Echo 2007: mod LVH, diast dysfn, mild MR, no LVOT obstruction;  b. echo 9/13: upper septal thickening, no SAM of MV, no LVOT gradient, mild LVH, EF 65%  . Vitamin D deficiency     Past Surgical History:  Procedure Laterality  Date  . CERVICAL SPINE SURGERY  [phone]   nerve release, Dr Hal Neer  . SPINE SURGERY     L4-L5, 1992-1993    History  Smoking Status  . Former Smoker  . Packs/day: 1.00  . Years: 7.00  . Types: Cigarettes  . Quit date: 11/29/1975  Smokeless Tobacco  . Never Used    History  Alcohol Use  . 1.0 oz/week  . 2 Standard drinks or equivalent per week    Family History  Problem Relation Age of Onset  . Cancer Mother 37    cervical  . Cancer Father     lung  . Stroke Brother   . Atrial fibrillation      Reviw of Systems:  Reviewed in the HPI.  All other systems are negative.  Physical Exam: BP (!) 142/90 (BP Location: Left Arm, Patient Position: Sitting, Cuff Size: Normal)   Pulse 60   Ht 5\' 9"  (1.753 m)   Wt 177 lb 6.4 oz (80.5 kg)   BMI 26.20 kg/m  The patient is alert and oriented x 3.  The mood and affect are normal.   Skin: warm and dry.  Color is normal.    HEENT:   the sclera are nonicteric.  The mucous membranes are moist.  The carotids are 2+ without bruits.  There is no thyromegaly.  There is no JVD.   Lungs: clear.  The chest wall is non tender.   Heart: regular rate with a normal S1 and S2.  Very soft systolic murmur  Abdomen: good bowel sounds.  There is no guarding or rebound.  There is no hepatosplenomegaly or tenderness.  There are no masses.  Extremities:  no clubbing, cyanosis, or edema.  The legs are without rashes.  The distal pulses are intact.  Neuro:  Cranial nerves II - XII are intact.  Motor and sensory functions are intact.    The gait is normal.  EKG:   DEc. 8, 2017:   NSR at 60 . RAD    Assessment / Plan:   1. HOCM:  Continue current meds. Doing great.  Refill meds.  He remains very busy. No CP or dyspnea , continue current meds.   Will see him in 1 year    [name], MD  11/04/2016 12:04 PM    [address] [place],  [place], [place]  [address] Pager [phone] Phone: [phone]  [phone]; Fax: [phone]   [facility]  [address] [place], [place]  [address] [phone]   Fax [phone]

## 2016-11-15 DIAGNOSIS — L821 Other seborrheic keratosis: Secondary | ICD-10-CM | POA: Diagnosis not present

## 2016-11-15 DIAGNOSIS — L820 Inflamed seborrheic keratosis: Secondary | ICD-10-CM | POA: Diagnosis not present

## 2016-11-23 DIAGNOSIS — L820 Inflamed seborrheic keratosis: Secondary | ICD-10-CM | POA: Diagnosis not present

## 2017-01-18 ENCOUNTER — Other Ambulatory Visit: Payer: Self-pay | Admitting: *Deleted

## 2017-01-18 MED ORDER — CARVEDILOL 12.5 MG PO TABS: 12.5000 mg | ORAL_TABLET | Freq: Two times a day (BID) | ORAL | 2 refills | Status: DC

## 2017-02-22 ENCOUNTER — Other Ambulatory Visit: Payer: Self-pay | Admitting: Family Medicine

## 2017-03-08 DIAGNOSIS — H5203 Hypermetropia, bilateral: Secondary | ICD-10-CM | POA: Diagnosis not present

## 2017-03-08 DIAGNOSIS — I10 Essential (primary) hypertension: Secondary | ICD-10-CM | POA: Diagnosis not present

## 2017-03-08 DIAGNOSIS — H40009 Preglaucoma, unspecified, unspecified eye: Secondary | ICD-10-CM | POA: Diagnosis not present

## 2017-03-08 DIAGNOSIS — H25813 Combined forms of age-related cataract, bilateral: Secondary | ICD-10-CM | POA: Diagnosis not present

## 2017-03-08 DIAGNOSIS — H52223 Regular astigmatism, bilateral: Secondary | ICD-10-CM | POA: Diagnosis not present

## 2017-03-08 DIAGNOSIS — H35033 Hypertensive retinopathy, bilateral: Secondary | ICD-10-CM | POA: Diagnosis not present

## 2017-03-08 DIAGNOSIS — D2310 Other benign neoplasm of skin of unspecified eyelid, including canthus: Secondary | ICD-10-CM | POA: Diagnosis not present

## 2017-03-08 DIAGNOSIS — H43813 Vitreous degeneration, bilateral: Secondary | ICD-10-CM | POA: Diagnosis not present

## 2017-03-24 ENCOUNTER — Ambulatory Visit (INDEPENDENT_AMBULATORY_CARE_PROVIDER_SITE_OTHER): Payer: Medicare Other | Admitting: Family Medicine

## 2017-03-24 VITALS — BP 132/74 | HR 68 | Temp 97.9°F | Ht 69.0 in | Wt 178.0 lb

## 2017-03-24 DIAGNOSIS — Z0000 Encounter for general adult medical examination without abnormal findings: Secondary | ICD-10-CM | POA: Diagnosis not present

## 2017-03-24 DIAGNOSIS — R5382 Chronic fatigue, unspecified: Secondary | ICD-10-CM | POA: Diagnosis not present

## 2017-03-24 DIAGNOSIS — F321 Major depressive disorder, single episode, moderate: Secondary | ICD-10-CM | POA: Diagnosis not present

## 2017-03-24 LAB — COMPLETE METABOLIC PANEL WITH GFR
AG Ratio: 1.7 Ratio (ref 1.0–2.5)
ALT: 31 U/L (ref 9–46)
AST: 21 U/L (ref 10–35)
Albumin: 4 g/dL (ref 3.6–5.1)
Alkaline Phosphatase: 65 U/L (ref 40–115)
BUN/Creatinine Ratio: 12.9 Ratio (ref 6–22)
BUN: 11 mg/dL (ref 7–25)
CO2: 26 mmol/L (ref 20–31)
Calcium: 9.1 mg/dL (ref 8.6–10.3)
Chloride: 102 mmol/L (ref 98–110)
Creat: 0.85 mg/dL (ref 0.70–1.25)
GFR, Est African American: >89 mL/min (ref >=60)
GFR, Est Non African American: >89 mL/min (ref >=60)
Globulin: 2.4 g/dL (ref 1.9–3.7)
Glucose, Bld: 107 mg/dL — ABNORMAL HIGH (ref 70–99)
Potassium: 4.6 mmol/L (ref 3.5–5.3)
Sodium: 141 mmol/L (ref 135–146)
Total Bilirubin: 0.5 mg/dL (ref 0.2–1.2)
Total Protein: 6.4 g/dL (ref 6.1–8.1)

## 2017-03-24 LAB — CBC WITH DIFFERENTIAL/PLATELET
Basophils Absolute: 44 cells/uL (ref 0–200)
Basophils Relative: 1 %
Eosinophils Absolute: 88 cells/uL (ref 15–500)
Eosinophils Relative: 2 %
HCT: 44.5 % (ref 38.5–50.0)
Hemoglobin: 15.2 g/dL (ref 13.0–17.0)
Lymphocytes Relative: 25 %
Lymphs Abs: 1100 cells/uL (ref 850–3900)
MCH: 32.8 pg (ref 27.0–33.0)
MCHC: 34.2 g/dL (ref 32.0–36.0)
MCV: 96.1 fL (ref 80.0–100.0)
MPV: 10.4 fL (ref 7.5–12.5)
Monocytes Absolute: 616 cells/uL (ref 200–950)
Monocytes Relative: 14 %
Neutro Abs: 2552 cells/uL (ref 1500–7800)
Neutrophils Relative %: 58 %
Platelets: 225 10*3/uL (ref 140–400)
RBC: 4.63 MIL/uL (ref 4.20–5.80)
RDW: 13.7 % (ref 11.0–15.0)
WBC: 4.4 10*3/uL (ref 3.8–10.8)

## 2017-03-24 LAB — LIPID PANEL
Cholesterol: 140 mg/dL (ref <200)
HDL: 46 mg/dL (ref >40)
LDL Cholesterol: 78 mg/dL (ref <100)
Total CHOL/HDL Ratio: 3 Ratio (ref <5.0)
Triglycerides: 81 mg/dL (ref <150)
VLDL: 16 mg/dL (ref <30)

## 2017-03-24 MED ORDER — ESCITALOPRAM OXALATE 10 MG PO TABS: 10.0000 mg | ORAL_TABLET | Freq: Every day | ORAL | 5 refills | Status: DC

## 2017-03-24 MED ORDER — CLONAZEPAM 0.5 MG PO TABS: 0.5000 mg | ORAL_TABLET | Freq: Every evening | ORAL | 2 refills | Status: DC | PRN

## 2017-03-24 NOTE — Progress Notes (Signed)
Subjective:    Patient ID: Walter Cross, male    DOB: May 27, 1951, 66 y.o.   MRN: [id number]  HPI Patient is here today for complete physical exam. He reports increasing fatigue, depression, anhedonia, trouble sleeping, occasional anxiety. He states this is been gradually worsening over the last several months. He developed depression when his wife passed away several years ago and he feels like the symptoms are returning. He just doesn't have any desire to do anything. He wakes up after usually 4 hours of sleep and is unable to fall back asleep because his mind starts to ruminate over fears and concerns. He also reports weird, vivid dreams. Colonoscopy was performed in 2015 and is up-to-date. He is due for digital rectal exam and PSA. Immunizations are up-to-date Immunization History  Administered Date(s) Administered  . Influenza,inj,Quad PF,36+ Mos 09/20/2013, 11/19/2014, 09/17/2015, 09/13/2016  . Pneumococcal Conjugate-13 03/18/2016  . Pneumococcal Polysaccharide-23 03/17/2015  . Tdap 02/16/2011   We did discuss the shingles vaccine but I recommended he check with his insurance first Past Surgical History:  Procedure Laterality Date  . CERVICAL SPINE SURGERY  [phone]   nerve release, Dr Hal Neer  . SPINE SURGERY     L4-L5, [phone]   Current Outpatient Prescriptions on File Prior to Visit  Medication Sig Dispense Refill  . acyclovir ointment (ZOVIRAX) 5 % Apply 1 application topically daily as needed (cold sores & herpes). 15 g 1  . aspirin 81 MG tablet Take 81 mg by mouth daily.      . carvedilol (COREG) 12.5 MG tablet Take 1 tablet (12.5 mg total) by mouth 2 (two) times daily with a meal. 180 tablet 2  . Cholecalciferol (VITAMIN D PO) Take 4,000 mg by mouth daily.    Marland Kitchen diltiazem (CARDIZEM CD) 180 MG 24 hr capsule Take 1 capsule (180 mg total) by mouth daily. 90 capsule 3  . ibuprofen (ADVIL,MOTRIN) 200 MG tablet Take 200 mg by mouth every 4 (four) hours as needed for  headache, mild pain or moderate pain.     . pantoprazole (PROTONIX) 40 MG tablet Take 40 mg by mouth daily.    . ramipril (ALTACE) 10 MG capsule take 1 capsule by mouth once daily 90 capsule 0  . sildenafil (REVATIO) 20 MG tablet 3-5 tablets daily as needed 50 tablet 3  . valACYclovir (VALTREX) 1000 MG tablet Take 1 tablet (1,000 mg total) by mouth 2 (two) times daily. 4 tablet 1   No current facility-administered medications on file prior to visit.    Allergies  Allergen Reactions  . Esomeprazole Magnesium Other (See Comments)    cramps  . Septra [Bactrim] Other (See Comments)    Back ache   Social History   Social History  . Marital status: Widowed    Spouse name: N/A  . Number of children: N/A  . Years of education: N/A   Occupational History  . 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 Smoking status: Former Smoker    Packs/day: 1.00    Years: 7.00    Types: Cigarettes    Quit date: 11/29/1975  . Smokeless tobacco: Never Used  . Alcohol use 1.0 oz/week    2 Standard drinks or equivalent per week  . Drug use: No  . Sexual activity: Not on file   Other Topics Concern  . Not on file   Social History Narrative  . No narrative on file   Family History  Problem Relation  Age of Onset  . Cancer Mother 54    cervical  . Cancer Father     lung  . Stroke Brother   . Atrial fibrillation      .p   Review of Systems  All other systems reviewed and are negative.      Objective:   Physical Exam  Constitutional: He is oriented to person, place, and time. He appears well-developed and well-nourished. No distress.  HENT:  Head: Normocephalic and atraumatic.  Right Ear: External ear normal.  Left Ear: External ear normal.  Nose: Nose normal.  Mouth/Throat: Oropharynx is clear and moist. No oropharyngeal exudate.  Eyes: Conjunctivae and EOM are normal. Pupils are equal, round, and reactive to light. Right eye exhibits no  discharge. Left eye exhibits no discharge. No scleral icterus.  Neck: Normal range of motion. Neck supple. No JVD present. No tracheal deviation present. No thyromegaly present.  Cardiovascular: Normal rate, regular rhythm, normal heart sounds and intact distal pulses.  Exam reveals no gallop and no friction rub.   No murmur heard. Pulmonary/Chest: Effort normal and breath sounds normal. No stridor. No respiratory distress. He has no wheezes. He has no rales. He exhibits no tenderness.  Abdominal: Soft. Bowel sounds are normal. He exhibits no distension and no mass. There is no tenderness. There is no rebound and no guarding.  Genitourinary: Rectum normal and prostate normal.  Musculoskeletal: Normal range of motion. He exhibits no edema or tenderness.  Lymphadenopathy:    He has no cervical adenopathy.  Neurological: He is alert and oriented to person, place, and time. He has normal reflexes. No cranial nerve deficit. He exhibits normal muscle tone. Coordination normal.  Skin: Skin is warm. No rash noted. He is not diaphoretic. No erythema. No pallor.  Psychiatric: He has a normal mood and affect. His behavior is normal. Judgment and thought content normal.  Vitals reviewed.         Assessment & Plan:  Depression, major, single episode, moderate (HCC) - Plan: clonazePAM (KLONOPIN) 0.5 MG tablet, escitalopram (LEXAPRO) 10 MG tablet  Routine general medical examination at a health care facility - Plan: CBC with Differential/Platelet, COMPLETE METABOLIC PANEL WITH GFR, Lipid panel, PSA  Chronic fatigue - Plan: TSH, Testosterone  patient's physical exam is normal. His blood pressure is excellent. His colonoscopy is up-to-date. His prostate exam is up-to-date. I will check a CBC, CMP, fasting lipid panel, and a PSA. Immunizations are up-to-date. Patient will check on the cost of the shingles vaccine prior to receiving it. I did refill the patient's Klonopin which she uses sparingly for insomnia  and anxiety. I would like to start him on Lexapro 10 mg a day and reassess the patient in one month to see if his depression is improving. I will also check a TSH as well as a testosterone level to complete the workup for his fatigue

## 2017-03-25 LAB — TSH: TSH: 1.48 mIU/L (ref 0.40–4.50)

## 2017-03-25 LAB — TESTOSTERONE: Testosterone: 375 ng/dL (ref 250–827)

## 2017-03-25 LAB — PSA: PSA: 0.4 ng/mL (ref <=4.0)

## 2017-03-27 ENCOUNTER — Encounter: Payer: Self-pay | Admitting: Family Medicine

## 2017-04-21 ENCOUNTER — Ambulatory Visit (INDEPENDENT_AMBULATORY_CARE_PROVIDER_SITE_OTHER): Payer: Medicare Other | Admitting: Family Medicine

## 2017-04-21 ENCOUNTER — Encounter: Payer: Self-pay | Admitting: Family Medicine

## 2017-04-21 VITALS — BP 110/72 | HR 78 | Temp 98.2°F | Resp 16 | Ht 69.0 in | Wt 168.0 lb

## 2017-04-21 DIAGNOSIS — F321 Major depressive disorder, single episode, moderate: Secondary | ICD-10-CM

## 2017-04-21 NOTE — Progress Notes (Signed)
Subjective:    Patient ID: Walter Cross, male    DOB: 12-02-1950, 66 y.o.   MRN: [id number]  HPI  03/24/17 Patient is here today for complete physical exam. He reports increasing fatigue, depression, anhedonia, trouble sleeping, occasional anxiety. He states this is been gradually worsening over the last several months. He developed depression when his wife passed away several years ago and he feels like the symptoms are returning. He just doesn't have any desire to do anything. He wakes up after usually 4 hours of sleep and is unable to fall back asleep because his mind starts to ruminate over fears and concerns. He also reports weird, vivid dreams. Colonoscopy was performed in 2015 and is up-to-date. He is due for digital rectal exam and PSA. Immunizations are up-to-date  At that time, my plan was:  patient's physical exam is normal. His blood pressure is excellent. His colonoscopy is up-to-date. His prostate exam is up-to-date. I will check a CBC, CMP, fasting lipid panel, and a PSA. Immunizations are up-to-date. Patient will check on the cost of the shingles vaccine prior to receiving it. I did refill the patient's Klonopin which she uses sparingly for insomnia and anxiety. I would like to start him on Lexapro 10 mg a day and reassess the patient in one month to see if his depression is improving. I will also check a TSH as well as a testosterone level to complete the workup for his fatigue  04/21/17 Labs showed no cause for the patient's fatigue. He is tolerating the Lexapro well. He states that he sleeping better however his mood has not improved dramatically. He states that he may be some better. He admits that he is not exercising. He is not engaging in any hobbies or other activities. He states that at night, when his significant other is at the house, he does well. During the daytime when he sits idle, he tends to have worsening depression. Past Surgical History:  Procedure Laterality  Date  . CERVICAL SPINE SURGERY  [phone]   nerve release, Dr Hal Neer  . SPINE SURGERY     L4-L5, [phone]   Current Outpatient Prescriptions on File Prior to Visit  Medication Sig Dispense Refill  . acyclovir ointment (ZOVIRAX) 5 % Apply 1 application topically daily as needed (cold sores & herpes). 15 g 1  . aspirin 81 MG tablet Take 81 mg by mouth daily.      . carvedilol (COREG) 12.5 MG tablet Take 1 tablet (12.5 mg total) by mouth 2 (two) times daily with a meal. 180 tablet 2  . Cholecalciferol (VITAMIN D PO) Take 4,000 mg by mouth daily.    . clonazePAM (KLONOPIN) 0.5 MG tablet Take 1 tablet (0.5 mg total) by mouth at bedtime as needed for anxiety (sleep). 30 tablet 2  . Cyanocobalamin (VITAMIN B-12) 1000 MCG SUBL Place under the tongue.    . diltiazem (CARDIZEM CD) 180 MG 24 hr capsule Take 1 capsule (180 mg total) by mouth daily. 90 capsule 3  . escitalopram (LEXAPRO) 10 MG tablet Take 1 tablet (10 mg total) by mouth daily. 30 tablet 5  . ibuprofen (ADVIL,MOTRIN) 200 MG tablet Take 200 mg by mouth every 4 (four) hours as needed for headache, mild pain or moderate pain.     . pantoprazole (PROTONIX) 40 MG tablet Take 40 mg by mouth daily.    . ramipril (ALTACE) 10 MG capsule take 1 capsule by mouth once daily 90 capsule 0  . sildenafil (REVATIO)  20 MG tablet 3-5 tablets daily as needed 50 tablet 3  . valACYclovir (VALTREX) 1000 MG tablet Take 1 tablet (1,000 mg total) by mouth 2 (two) times daily. 4 tablet 1   No current facility-administered medications on file prior to visit.    Allergies  Allergen Reactions  . Esomeprazole Magnesium Other (See Comments)    cramps  . Septra [Bactrim] Other (See Comments)    Back ache   Social History   Social History  . Marital status: Widowed    Spouse name: N/A  . Number of children: N/A  . Years of education: N/A   Occupational History  . 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 Smoking  status: Former Smoker    Packs/day: 1.00    Years: 7.00    Types: Cigarettes    Quit date: 11/29/1975  . Smokeless tobacco: Never Used  . Alcohol use 1.0 oz/week    2 Standard drinks or equivalent per week  . Drug use: No  . Sexual activity: Not on file   Other Topics Concern  . Not on file   Social History Narrative  . No narrative on file   Family History  Problem Relation Age of Onset  . Cancer Mother 45       cervical  . Cancer Father        lung  . Stroke Brother   . Atrial fibrillation Unknown     .p   Review of Systems  All other systems reviewed and are negative.      Objective:   Physical Exam  Constitutional: He is oriented to person, place, and time. He appears well-developed and well-nourished. No distress.  HENT:  Head: Normocephalic and atraumatic.  Right Ear: External ear normal.  Left Ear: External ear normal.  Nose: Nose normal.  Mouth/Throat: Oropharynx is clear and moist. No oropharyngeal exudate.  Eyes: Conjunctivae and EOM are normal. Pupils are equal, round, and reactive to light. Right eye exhibits no discharge. Left eye exhibits no discharge. No scleral icterus.  Neck: Normal range of motion. Neck supple. No JVD present. No tracheal deviation present. No thyromegaly present.  Cardiovascular: Normal rate, regular rhythm, normal heart sounds and intact distal pulses.  Exam reveals no gallop and no friction rub.   No murmur heard. Pulmonary/Chest: Effort normal and breath sounds normal. No stridor. No respiratory distress. He has no wheezes. He has no rales. He exhibits no tenderness.  Abdominal: Soft. Bowel sounds are normal. He exhibits no distension and no mass. There is no tenderness. There is no rebound and no guarding.  Genitourinary: Rectum normal and prostate normal.  Musculoskeletal: Normal range of motion. He exhibits no edema or tenderness.  Lymphadenopathy:    He has no cervical adenopathy.  Neurological: He is alert and oriented to  person, place, and time. He has normal reflexes. No cranial nerve deficit. He exhibits normal muscle tone. Coordination normal.  Skin: Skin is warm. No rash noted. He is not diaphoretic. No erythema. No pallor.  Psychiatric: He has a normal mood and affect. His behavior is normal. Judgment and thought content normal.  Vitals reviewed.         Assessment & Plan:  Depression, major, single episode, moderate (HCC)  I will allow more time for the Lexapro to take effect. Encouraged the patient to find some type of activity that provides him pleasure and perform that for 30 minutes every day whether its exercise, a hobby,  work on Chief Financial Officer, yard work, Social research officer, government. Neurosurgeon in 4-6 weeks

## 2017-05-26 ENCOUNTER — Other Ambulatory Visit: Payer: Self-pay | Admitting: Family Medicine

## 2017-06-09 DIAGNOSIS — D1801 Hemangioma of skin and subcutaneous tissue: Secondary | ICD-10-CM | POA: Diagnosis not present

## 2017-06-09 DIAGNOSIS — D239 Other benign neoplasm of skin, unspecified: Secondary | ICD-10-CM | POA: Diagnosis not present

## 2017-06-09 DIAGNOSIS — L821 Other seborrheic keratosis: Secondary | ICD-10-CM | POA: Diagnosis not present

## 2017-06-09 DIAGNOSIS — L814 Other melanin hyperpigmentation: Secondary | ICD-10-CM | POA: Diagnosis not present

## 2017-09-17 ENCOUNTER — Other Ambulatory Visit: Payer: Self-pay | Admitting: Family Medicine

## 2017-09-17 DIAGNOSIS — F321 Major depressive disorder, single episode, moderate: Secondary | ICD-10-CM

## 2017-10-14 ENCOUNTER — Other Ambulatory Visit: Payer: Self-pay | Admitting: Family Medicine

## 2017-10-14 DIAGNOSIS — F321 Major depressive disorder, single episode, moderate: Secondary | ICD-10-CM

## 2017-10-16 ENCOUNTER — Other Ambulatory Visit: Payer: Self-pay | Admitting: Family Medicine

## 2017-10-16 DIAGNOSIS — F321 Major depressive disorder, single episode, moderate: Secondary | ICD-10-CM

## 2017-10-25 ENCOUNTER — Other Ambulatory Visit: Payer: Self-pay

## 2017-10-25 DIAGNOSIS — I421 Obstructive hypertrophic cardiomyopathy: Secondary | ICD-10-CM

## 2017-10-25 DIAGNOSIS — I10 Essential (primary) hypertension: Secondary | ICD-10-CM

## 2017-10-25 MED ORDER — DILTIAZEM HCL ER COATED BEADS 180 MG PO CP24: 180.0000 mg | ORAL_CAPSULE | Freq: Every day | ORAL | 0 refills | Status: DC

## 2017-11-03 ENCOUNTER — Encounter: Payer: Self-pay | Admitting: Family Medicine

## 2017-11-03 ENCOUNTER — Ambulatory Visit (INDEPENDENT_AMBULATORY_CARE_PROVIDER_SITE_OTHER): Payer: Medicare Other | Admitting: Family Medicine

## 2017-11-03 VITALS — BP 130/70 | HR 62 | Temp 98.6°F | Resp 18 | Ht 69.0 in | Wt 173.0 lb

## 2017-11-03 DIAGNOSIS — F321 Major depressive disorder, single episode, moderate: Secondary | ICD-10-CM | POA: Diagnosis not present

## 2017-11-03 DIAGNOSIS — Z23 Encounter for immunization: Secondary | ICD-10-CM

## 2017-11-03 NOTE — Progress Notes (Signed)
Subjective:    Patient ID: Walter Cross, male    DOB: 15-Aug-1951, 66 y.o.   MRN: [id number]  HPI  03/24/17 Patient is here today for complete physical exam. He reports increasing fatigue, depression, anhedonia, trouble sleeping, occasional anxiety. He states this is been gradually worsening over the last several months. He developed depression when his wife passed away several years ago and he feels like the symptoms are returning. He just doesn't have any desire to do anything. He wakes up after usually 4 hours of sleep and is unable to fall back asleep because his mind starts to ruminate over fears and concerns. He also reports weird, vivid dreams. Colonoscopy was performed in 2015 and is up-to-date. He is due for digital rectal exam and PSA. Immunizations are up-to-date  At that time, my plan was:  patient's physical exam is normal. His blood pressure is excellent. His colonoscopy is up-to-date. His prostate exam is up-to-date. I will check a CBC, CMP, fasting lipid panel, and a PSA. Immunizations are up-to-date. Patient will check on the cost of the shingles vaccine prior to receiving it. I did refill the patient's Klonopin which she uses sparingly for insomnia and anxiety. I would like to start him on Lexapro 10 mg a day and reassess the patient in one month to see if his depression is improving. I will also check a TSH as well as a testosterone level to complete the workup for his fatigue  04/21/17 Labs showed no cause for the patient's fatigue. He is tolerating the Lexapro well. He states that he sleeping better however his mood has not improved dramatically. He states that he may be some better. He admits that he is not exercising. He is not engaging in any hobbies or other activities. He states that at night, when his significant other is at the house, he does well. During the daytime when he sits idle, he tends to have worsening depression.  At that time, my plan was: I will allow more  time for the Lexapro to take effect. Encouraged the patient to find some type of activity that provides him pleasure and perform that for 30 minutes every day whether its exercise, a hobby, work on motors, yard work, Social research officer, government. Neurosurgeon in 4-6 weeks  11/03/17 Patient is here today to discuss his Lexapro.  He does not feel that the medication is helping him as much as he had hoped.  He continues to have very little drive.  He continues to complain of fatigue.  He has no motivation to do the things that he wants or needs to do.  He also reports poor libido and erectile dysfunction on the medication.  He denies any suicidal thoughts.  He denies any anhedonia.  He denies any insomnia.  Concentration has improved.  Overall he is doing better but still not in remission Past Surgical History:  Procedure Laterality Date  . CERVICAL SPINE SURGERY  [phone]   nerve release, Dr Hal Neer  . SPINE SURGERY     L4-L5, [phone]   Current Outpatient Medications on File Prior to Visit  Medication Sig Dispense Refill  . acyclovir ointment (ZOVIRAX) 5 % Apply 1 application topically daily as needed (cold sores & herpes). 15 g 1  . aspirin 81 MG tablet Take 81 mg by mouth daily.      . carvedilol (COREG) 12.5 MG tablet Take 1 tablet (12.5 mg total) by mouth 2 (two) times daily with a meal. 180 tablet 2  . Cholecalciferol (  VITAMIN D PO) Take 4,000 mg by mouth daily.    . clonazePAM (KLONOPIN) 0.5 MG tablet Take 1 tablet (0.5 mg total) by mouth at bedtime as needed for anxiety (sleep). 30 tablet 2  . Cyanocobalamin (VITAMIN B-12) 1000 MCG SUBL Place under the tongue.    . diltiazem (CARDIZEM CD) 180 MG 24 hr capsule Take 1 capsule (180 mg total) by mouth daily. 90 capsule 0  . escitalopram (LEXAPRO) 10 MG tablet TAKE 1 TABLET BY MOUTH EVERY DAY 90 tablet 0  . ibuprofen (ADVIL,MOTRIN) 200 MG tablet Take 200 mg by mouth every 4 (four) hours as needed for headache, mild pain or moderate pain.     . pantoprazole (PROTONIX) 40  MG tablet Take 40 mg by mouth daily.    . ramipril (ALTACE) 10 MG capsule take 1 capsule by mouth once daily 90 capsule 3  . sildenafil (REVATIO) 20 MG tablet 3-5 tablets daily as needed 50 tablet 3  . valACYclovir (VALTREX) 1000 MG tablet Take 1 tablet (1,000 mg total) by mouth 2 (two) times daily. 4 tablet 1   No current facility-administered medications on file prior to visit.    Allergies  Allergen Reactions  . Esomeprazole Magnesium Other (See Comments)    cramps  . Septra [Bactrim] Other (See Comments)    Back ache   Social History   Socioeconomic History  . Marital status: Widowed    Spouse name: Not on file  . Number of children: Not on file  . Years of education: Not on file  . Highest education level: Not on file  Social Needs  . Financial resource strain: Not on file  . Food insecurity - worry: Not on file  . Food insecurity - inability: Not on file  . Transportation needs - medical: Not on file  . Transportation needs - non-medical: Not on file  Occupational History  . Occupation: Patent examiner: [employer]  Tobacco Use  . Smoking status: Former Smoker    Packs/day: 1.00    Years: 7.00    Pack years: 7.00    Types: Cigarettes    Last attempt to quit: 11/29/1975    Years since quitting: 41.9  . Smokeless tobacco: Never Used  Substance and Sexual Activity  . Alcohol use: Yes    Alcohol/week: 1.0 oz    Types: 2 Standard drinks or equivalent per week  . Drug use: No  . Sexual activity: Not on file  Other Topics Concern  . Not on file  Social History Narrative  . Not on file   Family History  Problem Relation Age of Onset  . Cancer Mother 35       cervical  . Cancer Father        lung  . Stroke Brother   . Atrial fibrillation Unknown     .p   Review of Systems  All other systems reviewed and are negative.      Objective:   Physical Exam  Constitutional: He is oriented to person, place, and time. He appears  well-developed and well-nourished. No distress.  HENT:  Head: Normocephalic and atraumatic.  Right Ear: External ear normal.  Left Ear: External ear normal.  Nose: Nose normal.  Mouth/Throat: Oropharynx is clear and moist. No oropharyngeal exudate.  Eyes: Conjunctivae and EOM are normal. Pupils are equal, round, and reactive to light. Right eye exhibits no discharge. Left eye exhibits no discharge. No scleral icterus.  Neck: Normal range of  motion. Neck supple. No JVD present. No tracheal deviation present. No thyromegaly present.  Cardiovascular: Normal rate, regular rhythm, normal heart sounds and intact distal pulses. Exam reveals no gallop and no friction rub.  No murmur heard. Pulmonary/Chest: Effort normal and breath sounds normal. No stridor. No respiratory distress. He has no wheezes. He has no rales. He exhibits no tenderness.  Abdominal: Soft. Bowel sounds are normal. He exhibits no distension and no mass. There is no tenderness. There is no rebound and no guarding.  Genitourinary: Rectum normal and prostate normal.  Musculoskeletal: Normal range of motion. He exhibits no edema or tenderness.  Lymphadenopathy:    He has no cervical adenopathy.  Neurological: He is alert and oriented to person, place, and time. He has normal reflexes. No cranial nerve deficit. He exhibits normal muscle tone. Coordination normal.  Skin: Skin is warm. No rash noted. He is not diaphoretic. No erythema. No pallor.  Psychiatric: He has a normal mood and affect. His behavior is normal. Judgment and thought content normal.  Vitals reviewed.         Assessment & Plan:  Needs flu shot - Plan: Flu Vaccine QUAD 36+ mos IM  Depression, major, single episode, moderate (HCC)  Change Lexapro to Trintellix 5 mg poqday for 1 week.  Then increase to 10 mg a day and reassess in 6 weeks.

## 2017-11-06 ENCOUNTER — Ambulatory Visit: Payer: Self-pay | Admitting: Cardiovascular Disease

## 2017-11-08 ENCOUNTER — Ambulatory Visit (INDEPENDENT_AMBULATORY_CARE_PROVIDER_SITE_OTHER): Payer: Medicare Other | Admitting: Cardiovascular Disease

## 2017-11-08 ENCOUNTER — Encounter: Payer: Self-pay | Admitting: Cardiovascular Disease

## 2017-11-08 VITALS — BP 146/78 | HR 72 | Ht 69.0 in | Wt 171.8 lb

## 2017-11-08 DIAGNOSIS — I517 Cardiomegaly: Secondary | ICD-10-CM | POA: Diagnosis not present

## 2017-11-08 DIAGNOSIS — I10 Essential (primary) hypertension: Secondary | ICD-10-CM

## 2017-11-08 NOTE — Patient Instructions (Signed)

## 2017-11-08 NOTE — Progress Notes (Signed)
Walter Cross Date of Birth  12/02/1950 [facility] [address]    [place], [place]  [address] [phone]  Fax  [phone]   66 year old gentleman with a history of a mild hypertrophic obstructive cardiomyopathy. He is done very well on medical therapy. He's been on carvedilol and Cardizem. He does not have any episodes of chest pain or shortness breath. He exercises occasionally.  He's looking forward to getting up and walking work this summer and spring.  Apr 23, 2013:  Walter Cross presents with some episodes of chest pain last year. He had a stress Myoview study which was normal. He's had a recent echocardiogram which did not reveal any LVOT gradient  Walter Cross placed him on an acid blocker and he is feeling well.    Nov. 24, 2014:    he was having some CP the last time I saw him.    A stress Myoview study which revealed no evidence of ischemia. He had normal left ventricular systolic function with an ejection fraction of 81%.  His CP have resolved after being started her on an acid blocker by Dr. [name].  Dec. 1, 2015:  Walter Cross is doing well. No cardiac issues.  No further GERD issues.   Admits that he could use a bit more exercise.  Dec. 1, 2016:  Doing great.   Exercising well.  Has vivid dreams - he thinks its due to the coreg   Dec. 8, 2017:  Doing well.   BP is slightly elevated but typically is normal . Avoids salt .    Not exercising much .    No CP or dyspnea.    Dec. 12, 2018: Doing well .  No CP , has HOCM.  Asymptomatic    Current Outpatient Medications on File Prior to Visit  Medication Sig Dispense Refill  . acyclovir ointment (ZOVIRAX) 5 % Apply 1 application topically daily as needed (cold sores & herpes). 15 g 1  . aspirin 81 MG tablet Take 81 mg by mouth daily.      . carvedilol (COREG) 12.5 MG tablet Take 1 tablet (12.5 mg total) by mouth 2 (two) times daily with a meal. 180 tablet 2  . Cholecalciferol (VITAMIN D PO) Take  4,000 mg by mouth daily.    . clonazePAM (KLONOPIN) 0.5 MG tablet Take 1 tablet (0.5 mg total) by mouth at bedtime as needed for anxiety (sleep). 30 tablet 2  . Cyanocobalamin (VITAMIN B-12) 1000 MCG SUBL Place under the tongue.    . diltiazem (CARDIZEM CD) 180 MG 24 hr capsule Take 1 capsule (180 mg total) by mouth daily. 90 capsule 0  . escitalopram (LEXAPRO) 10 MG tablet Take 1 tablet by mouth daily.  0  . ibuprofen (ADVIL,MOTRIN) 200 MG tablet Take 200 mg by mouth every 4 (four) hours as needed for headache, mild pain or moderate pain.     . pantoprazole (PROTONIX) 40 MG tablet Take 40 mg by mouth daily.    . ramipril (ALTACE) 10 MG capsule take 1 capsule by mouth once daily 90 capsule 3  . sildenafil (REVATIO) 20 MG tablet 3-5 tablets daily as needed 50 tablet 3  . valACYclovir (VALTREX) 1000 MG tablet Take 1 tablet (1,000 mg total) by mouth 2 (two) times daily. 4 tablet 1   No current facility-administered medications on file prior to visit.     Allergies  Allergen Reactions  . Esomeprazole Magnesium Other (See Comments)    cramps  . Septra [Bactrim]  Other (See Comments)    Back ache    Past Medical History:  Diagnosis Date  . Adenomatous colon polyp 2015    Cross (q 5 yrs)   . Barrett's esophagus   . Benign colon polyp   . Dermatitis    axillary  . 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 Genital HSV   . GERD (gastroesophageal reflux disease)   . Hx of cardiovascular stress test    ETT-Myoview 9/13: no ischemia, EF 81%  . Hyperlipidemia   . Hypertension   . Hypertrophic cardiomyopathy ([place])    a. Echo 2007: mod LVH, diast dysfn, mild MR, no LVOT obstruction;  b. echo 9/13: upper septal thickening, no SAM of MV, no LVOT gradient, mild LVH, EF 65%  . Vitamin D deficiency     Past Surgical History:  Procedure Laterality Date  . CERVICAL SPINE SURGERY  [phone]   nerve release, Dr Hal Neer  . SPINE SURGERY     L4-L5, [phone]    Social History   Tobacco  Use  Smoking Status Former Smoker  . Packs/day: 1.00  . Years: 7.00  . Pack years: 7.00  . Types: Cigarettes  . Last attempt to quit: 11/29/1975  . Years since quitting: 41.9  Smokeless Tobacco Never Used    Social History   Substance and Sexual Activity  Alcohol Use Yes  . Alcohol/week: 1.0 oz  . Types: 2 Standard drinks or equivalent per week    Family History  Problem Relation Age of Onset  . Cancer Mother 43       cervical  . Cancer Father        lung  . Stroke Brother   . Atrial fibrillation Unknown     Reviw of Systems:  Reviewed in the HPI.  All other systems are negative.  Physical Exam: Blood pressure (!) 146/78, pulse 72, height 5\' 9"  (1.753 m), weight 171 lb 12.8 oz (77.9 kg), SpO2 94 %.  GEN:  Well nourished, well developed in no acute distress HEENT: Normal NECK: No JVD; No carotid bruits LYMPHATICS: No lymphadenopathy CARDIAC: RR,  No significant murmur , no  rubs, gallops RESPIRATORY:  Clear to auscultation without rales, wheezing or rhonchi  ABDOMEN: Soft, non-tender, non-distended MUSCULOSKELETAL:  No edema; No deformity  SKIN: Warm and dry NEUROLOGIC:  Alert and oriented x 3 The gait is normal.  EKG:   November 08, 2017: Normal sinus rhythm at 72.  Right axis deviation.  The EKG is otherwise normal.  Assessment / Plan:   1.   Left ventricular hypertrophy.  Has mild HTN. Encouraged him to work in diet / exercise program .   He would rather start an exercise program and start walking instead of adding medications.  We will consider changing to Losartan instead of Altace.  I will see him again in 1 year.  2.  Essential hypertension: His blood pressures at the upper limits of normal.  He does not want to change from Altace to losartan at this point.  He was to continue exercising.  He will continue to avoid eating any additional salt.  Will see him in 1 year    [name], MD  11/08/2017 12:05 PM    [facility] [address],  [place], [place]  [address] Pager [phone] Phone: [phone]; Fax: [phone]

## 2017-11-17 ENCOUNTER — Other Ambulatory Visit: Payer: Self-pay

## 2017-11-17 MED ORDER — CARVEDILOL 12.5 MG PO TABS: 12.5000 mg | ORAL_TABLET | Freq: Two times a day (BID) | ORAL | 3 refills | Status: DC

## 2017-11-21 ENCOUNTER — Other Ambulatory Visit: Payer: Self-pay | Admitting: Family Medicine

## 2017-11-21 DIAGNOSIS — F321 Major depressive disorder, single episode, moderate: Secondary | ICD-10-CM

## 2017-12-06 ENCOUNTER — Telehealth: Payer: Self-pay | Admitting: Family Medicine

## 2017-12-06 MED ORDER — VORTIOXETINE HBR 10 MG PO TABS: 10.0000 mg | ORAL_TABLET | Freq: Every day | ORAL | 5 refills | Status: DC

## 2017-12-06 NOTE — Telephone Encounter (Signed)
Pt called and states that he is doing well on Trintellix and needs an rx sent to [facility] on [address]. He is concerned about price. Will send in rx and call me if too expensive or requires PA. He did ask if he could do the 20 mg tabs and cut in half. Will try the 10mg  first and see how much it is.

## 2017-12-12 ENCOUNTER — Telehealth: Payer: Self-pay | Admitting: Family Medicine

## 2017-12-12 NOTE — Telephone Encounter (Signed)
No generic equivalent of trintellix but could switch to effexor 75 mg poqam and increase to 150 mg poqam in 2 weeks.

## 2017-12-12 NOTE — Telephone Encounter (Signed)
Pt called, LMOVM stating that the Trintellix is 400$ on his ins and you had mentioned a generic medication he could take that was just as good as the Trintellix and he would like to have that sent to his pharm.

## 2017-12-13 ENCOUNTER — Other Ambulatory Visit: Payer: Self-pay | Admitting: Family Medicine

## 2017-12-13 MED ORDER — VENLAFAXINE HCL ER 75 MG PO CP24: ORAL_CAPSULE | ORAL | 0 refills | Status: DC

## 2017-12-13 NOTE — Telephone Encounter (Signed)
Pt aware of recommendations and med sent to pharm 

## 2018-01-29 ENCOUNTER — Other Ambulatory Visit: Payer: Self-pay | Admitting: Cardiovascular Disease

## 2018-01-29 DIAGNOSIS — I421 Obstructive hypertrophic cardiomyopathy: Secondary | ICD-10-CM

## 2018-01-29 DIAGNOSIS — I10 Essential (primary) hypertension: Secondary | ICD-10-CM

## 2018-01-29 MED ORDER — DILTIAZEM HCL ER COATED BEADS 180 MG PO CP24: 180.0000 mg | ORAL_CAPSULE | Freq: Every day | ORAL | 2 refills | Status: DC

## 2018-01-29 NOTE — Telephone Encounter (Signed)
Pt's medication was sent to pt's pharmacy as requested. Confirmation received.  °

## 2018-02-23 ENCOUNTER — Other Ambulatory Visit: Payer: Self-pay | Admitting: Family Medicine

## 2018-02-23 MED ORDER — RAMIPRIL 10 MG PO CAPS: 10.0000 mg | ORAL_CAPSULE | Freq: Every day | ORAL | 3 refills | Status: DC

## 2018-03-08 ENCOUNTER — Encounter: Payer: Self-pay | Admitting: Family Medicine

## 2018-03-08 ENCOUNTER — Ambulatory Visit (INDEPENDENT_AMBULATORY_CARE_PROVIDER_SITE_OTHER): Payer: Medicare Other | Admitting: Family Medicine

## 2018-03-08 ENCOUNTER — Other Ambulatory Visit: Payer: Self-pay

## 2018-03-08 VITALS — BP 124/82 | HR 75 | Temp 97.6°F | Wt 171.0 lb

## 2018-03-08 DIAGNOSIS — R198 Other specified symptoms and signs involving the digestive system and abdomen: Secondary | ICD-10-CM

## 2018-03-08 NOTE — Progress Notes (Signed)
Subjective:    Patient ID: Walter Cross, male    DOB: 1950-12-12, 67 y.o.   MRN: [id number]  HPI Patient reports 3 weeks of rectal pressure.  He reports a masslike sensation inside his rectum.  He never feels like he completely evacuates his stool.  He is having normal bowel movements however he never feels completely "cleaned out".  There is a constant pressure deep inside.  He denies any blood.  He denies any melena.  He denies any hematochezia.  He denies any pain with defecation.  He denies any constipation.  He denies any diarrhea.  Rectal exam was performed today in the office.  External rectal exam reveals no external hemorrhoids, perirectal abscess, or anal fissures.  Digital rectal exam reveals no palpable internal masses.  Stool is guaiac negative.  Prostate feels normal in size and shape and is nontender.  Anoscopic exam was performed and reveals normal rectal mucosa with no bleeding or internal hemorrhoids seen. Past Medical History:  Diagnosis Date  . Adenomatous colon polyp 2015    Buccini (q 5 yrs)   . Barrett's esophagus   . Benign colon polyp   . Depression   . Dermatitis    axillary  . 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 Genital HSV   . GERD (gastroesophageal reflux disease)   . Hx of cardiovascular stress test    ETT-Myoview 9/13: no ischemia, EF 81%  . Hyperlipidemia   . Hypertension   . Hypertrophic cardiomyopathy ([place])    a. Echo 2007: mod LVH, diast dysfn, mild MR, no LVOT obstruction;  b. echo 9/13: upper septal thickening, no SAM of MV, no LVOT gradient, mild LVH, EF 65%  . Vitamin D deficiency     Past Surgical History:  Procedure Laterality Date  . CERVICAL SPINE SURGERY  [phone]   nerve release, Dr Hal Neer  . SPINE SURGERY     L4-L5, [phone]   Current Outpatient Medications on File Prior to Visit  Medication Sig Dispense Refill  . acyclovir ointment (ZOVIRAX) 5 % Apply 1 application topically daily as needed (cold sores & herpes).  15 g 1  . aspirin 81 MG tablet Take 81 mg by mouth daily.      . carvedilol (COREG) 12.5 MG tablet Take 1 tablet (12.5 mg total) by mouth 2 (two) times daily with a meal. 180 tablet 3  . Cholecalciferol (VITAMIN D PO) Take 4,000 mg by mouth daily.    . clonazePAM (KLONOPIN) 0.5 MG tablet Take 1 tablet (0.5 mg total) by mouth at bedtime as needed for anxiety (sleep). 30 tablet 2  . Cyanocobalamin (VITAMIN B-12) 1000 MCG SUBL Place under the tongue.    . diltiazem (CARDIZEM CD) 180 MG 24 hr capsule Take 1 capsule (180 mg total) by mouth daily. 90 capsule 2  . escitalopram (LEXAPRO) 10 MG tablet Take 1 tablet by mouth daily.  0  . escitalopram (LEXAPRO) 10 MG tablet TAKE 1 TABLET BY MOUTH EVERY DAY 90 tablet 3  . ibuprofen (ADVIL,MOTRIN) 200 MG tablet Take 200 mg by mouth every 4 (four) hours as needed for headache, mild pain or moderate pain.     . pantoprazole (PROTONIX) 40 MG tablet Take 40 mg by mouth daily.    . ramipril (ALTACE) 10 MG capsule Take 1 capsule (10 mg total) by mouth daily. 90 capsule 3  . sildenafil (REVATIO) 20 MG tablet 3-5 tablets daily as needed 50 tablet 3  . valACYclovir (VALTREX)  1000 MG tablet Take 1 tablet (1,000 mg total) by mouth 2 (two) times daily. 4 tablet 1  . venlafaxine XR (EFFEXOR-XR) 75 MG 24 hr capsule TAKE 1 CAPSULE BY MOUTH EVERY MORNING FOR 2 WEEKS, THEN 2 CAPSULES EVERY MORNING THEREAFTER 180 capsule 0   No current facility-administered medications on file prior to visit.    Allergies  Allergen Reactions  . Esomeprazole Magnesium Other (See Comments)    cramps  . Septra [Bactrim] Other (See Comments)    Back ache   Social History   Socioeconomic History  . Marital status: Widowed    Spouse name: Not on file  . Number of children: Not on file  . Years of education: Not on file  . Highest education level: Not on file  Occupational History  . Occupation: Patent examiner: [employer]  . Financial  resource strain: Not on file  . Food insecurity:    Worry: Not on file    Inability: Not on file  . Transportation needs:    Medical: Not on file    Non-medical: Not on file  Tobacco Use  . Smoking status: Former Smoker    Packs/day: 1.00    Years: 7.00    Pack years: 7.00    Types: Cigarettes    Last attempt to quit: 11/29/1975    Years since quitting: 42.3  . Smokeless tobacco: Never Used  Substance and Sexual Activity  . Alcohol use: Yes    Alcohol/week: 1.0 oz    Types: 2 Standard drinks or equivalent per week  . Drug use: No  . Sexual activity: Not on file  Lifestyle  . Physical activity:    Days per week: Not on file    Minutes per session: Not on file  . Stress: Not on file  Relationships  . Social connections:    Talks on phone: Not on file    Gets together: Not on file    Attends religious service: Not on file    Active member of club or organization: Not on file    Attends meetings of clubs or organizations: Not on file    Relationship status: Not on file  . Intimate partner violence:    Fear of current or ex partner: Not on file    Emotionally abused: Not on file    Physically abused: Not on file    Forced sexual activity: Not on file  Other Topics Concern  . Not on file  Social History Narrative  . Not on file     Review of Systems  All other systems reviewed and are negative.      Objective:   Physical Exam  Cardiovascular: Normal rate, regular rhythm and normal heart sounds.  Pulmonary/Chest: Effort normal and breath sounds normal. No respiratory distress. He has no wheezes. He has no rales.  Abdominal: Soft. Bowel sounds are normal. He exhibits no distension. There is no tenderness. There is no rebound and no guarding.  Genitourinary: Rectal exam shows no external hemorrhoid, no internal hemorrhoid, no fissure, no mass, no tenderness, anal tone normal and guaiac negative stool. Prostate is not enlarged and not tender.  Vitals  reviewed.         Assessment & Plan:  Rectal pressure - Plan: Fecal Globin By Immunochemistry, Fecal Globin By Immunochemistry, Fecal Globin By Immunochemistry  Differential diagnosis includes constipation, impaction, or mass.  Patient's last colonoscopy was 4 years ago and was recommended again  in 5 years.  He does have a history of colon polyps.  We will try Linzess 290 mcg poqday to see if constipation is causing his symptoms.  I will check stool for blood x3.  If symptoms do not improve after using a stool softener, I would recommend GI consultation for sigmoidoscopy/further evaluation.  Exam today is normal

## 2018-03-09 DIAGNOSIS — H5203 Hypermetropia, bilateral: Secondary | ICD-10-CM | POA: Diagnosis not present

## 2018-03-09 DIAGNOSIS — H25813 Combined forms of age-related cataract, bilateral: Secondary | ICD-10-CM | POA: Diagnosis not present

## 2018-03-09 DIAGNOSIS — H52221 Regular astigmatism, right eye: Secondary | ICD-10-CM | POA: Diagnosis not present

## 2018-03-09 DIAGNOSIS — H524 Presbyopia: Secondary | ICD-10-CM | POA: Diagnosis not present

## 2018-03-16 DIAGNOSIS — R198 Other specified symptoms and signs involving the digestive system and abdomen: Secondary | ICD-10-CM | POA: Diagnosis not present

## 2018-03-17 DIAGNOSIS — R198 Other specified symptoms and signs involving the digestive system and abdomen: Secondary | ICD-10-CM | POA: Diagnosis not present

## 2018-03-19 DIAGNOSIS — R198 Other specified symptoms and signs involving the digestive system and abdomen: Secondary | ICD-10-CM | POA: Diagnosis not present

## 2018-03-26 ENCOUNTER — Other Ambulatory Visit: Payer: Self-pay

## 2018-03-26 ENCOUNTER — Ambulatory Visit (INDEPENDENT_AMBULATORY_CARE_PROVIDER_SITE_OTHER): Payer: Medicare Other | Admitting: Family Medicine

## 2018-03-26 ENCOUNTER — Encounter: Payer: Self-pay | Admitting: Family Medicine

## 2018-03-26 VITALS — BP 132/82 | HR 72 | Temp 98.3°F | Resp 16 | Ht 69.0 in | Wt 175.0 lb

## 2018-03-26 DIAGNOSIS — I421 Obstructive hypertrophic cardiomyopathy: Secondary | ICD-10-CM | POA: Diagnosis not present

## 2018-03-26 DIAGNOSIS — F321 Major depressive disorder, single episode, moderate: Secondary | ICD-10-CM

## 2018-03-26 DIAGNOSIS — Z125 Encounter for screening for malignant neoplasm of prostate: Secondary | ICD-10-CM | POA: Diagnosis not present

## 2018-03-26 DIAGNOSIS — Z0000 Encounter for general adult medical examination without abnormal findings: Secondary | ICD-10-CM

## 2018-03-26 DIAGNOSIS — I10 Essential (primary) hypertension: Secondary | ICD-10-CM | POA: Diagnosis not present

## 2018-03-26 LAB — COMPLETE METABOLIC PANEL WITH GFR
AG Ratio: 1.6 (calc) (ref 1.0–2.5)
ALT: 25 U/L (ref 9–46)
AST: 25 U/L (ref 10–35)
Albumin: 4.1 g/dL (ref 3.6–5.1)
Alkaline phosphatase (APISO): 77 U/L (ref 40–115)
BUN: 8 mg/dL (ref 7–25)
CO2: 30 mmol/L (ref 20–32)
Calcium: 9 mg/dL (ref 8.6–10.3)
Chloride: 102 mmol/L (ref 98–110)
Creat: 0.82 mg/dL (ref 0.70–1.25)
GFR, Est African American: 105 mL/min/{1.73_m2} (ref >=60)
GFR, Est Non African American: 91 mL/min/{1.73_m2} (ref >=60)
Globulin: 2.5 g/dL (calc) (ref 1.9–3.7)
Glucose, Bld: 110 mg/dL — ABNORMAL HIGH (ref 65–99)
Potassium: 4.9 mmol/L (ref 3.5–5.3)
Sodium: 139 mmol/L (ref 135–146)
Total Bilirubin: 0.5 mg/dL (ref 0.2–1.2)
Total Protein: 6.6 g/dL (ref 6.1–8.1)

## 2018-03-26 LAB — CBC WITH DIFFERENTIAL/PLATELET
Basophils Absolute: 52 cells/uL (ref 0–200)
Basophils Relative: 1.3 %
Eosinophils Absolute: 80 cells/uL (ref 15–500)
Eosinophils Relative: 2 %
HCT: 44.2 % (ref 38.5–50.0)
Hemoglobin: 15.8 g/dL (ref 13.2–17.1)
Lymphs Abs: 1064 cells/uL (ref 850–3900)
MCH: 34.9 pg — ABNORMAL HIGH (ref 27.0–33.0)
MCHC: 35.7 g/dL (ref 32.0–36.0)
MCV: 97.6 fL (ref 80.0–100.0)
MPV: 10.3 fL (ref 7.5–12.5)
Monocytes Relative: 14.8 %
Neutro Abs: 2212 cells/uL (ref 1500–7800)
Neutrophils Relative %: 55.3 %
Platelets: 225 10*3/uL (ref 140–400)
RBC: 4.53 10*6/uL (ref 4.20–5.80)
RDW: 13.4 % (ref 11.0–15.0)
Total Lymphocyte: 26.6 %
WBC mixed population: 592 cells/uL (ref 200–950)
WBC: 4 10*3/uL (ref 3.8–10.8)

## 2018-03-26 LAB — LIPID PANEL
Cholesterol: 175 mg/dL (ref <200)
HDL: 67 mg/dL (ref >40)
LDL Cholesterol (Calc): 88 mg/dL (calc)
Non-HDL Cholesterol (Calc): 108 mg/dL (calc) (ref <130)
Total CHOL/HDL Ratio: 2.6 (calc) (ref <5.0)
Triglycerides: 104 mg/dL (ref <150)

## 2018-03-26 NOTE — Progress Notes (Signed)
Subjective:    Patient ID: Walter Cross, male    DOB: [birth year], 67 y.o.   MRN: [id number]  HPI Patient is here today for complete physical exam. Colonoscopy was performed in 2015 and is up-to-date. He just had DRE last visit which was normal (see OV 4/11).  Unfortunately, the patient continues to have rectal pressure.  He is having regular bowel movements and therefore I do not feel that this is due to fecal impaction or constipation.  At his last visit, digital rectal exam was unremarkable.  Therefore I recommended GI consultation for endoscopy.  He has an appointment to see the gastroenterologist May 20.  Therefore I feel that repeating his digital rectal exam today is unnecessary given his future appointment.  However, will check PSA for prostate cancer screening.  He is due for Shingrix and I recommended that today.  Hep C screening UTD. Immunization History  Administered Date(s) Administered  . Influenza,inj,Quad PF,6+ Mos 09/20/2013, 11/19/2014, 09/17/2015, 09/13/2016, 11/03/2017  . Pneumococcal Conjugate-13 03/18/2016  . Pneumococcal Polysaccharide-23 03/17/2015  . Tdap 02/16/2011   We did discuss the shingles vaccine but I recommended he check with his insurance first Past Surgical History:  Procedure Laterality Date  . CERVICAL SPINE SURGERY  [phone]   nerve release, Dr Hal Neer  . SPINE SURGERY     L4-L5, [phone]   Current Outpatient Medications on File Prior to Visit  Medication Sig Dispense Refill  . acyclovir ointment (ZOVIRAX) 5 % Apply 1 application topically daily as needed (cold sores & herpes). 15 g 1  . aspirin 81 MG tablet Take 81 mg by mouth daily.      . carvedilol (COREG) 12.5 MG tablet Take 1 tablet (12.5 mg total) by mouth 2 (two) times daily with a meal. 180 tablet 3  . Cholecalciferol (VITAMIN D PO) Take 4,000 mg by mouth daily.    . clonazePAM (KLONOPIN) 0.5 MG tablet Take 1 tablet (0.5 mg total) by mouth at bedtime as needed for anxiety (sleep). 30  tablet 2  . Cyanocobalamin (VITAMIN B-12) 1000 MCG SUBL Place under the tongue.    . diltiazem (CARDIZEM CD) 180 MG 24 hr capsule Take 1 capsule (180 mg total) by mouth daily. 90 capsule 2  . escitalopram (LEXAPRO) 10 MG tablet TAKE 1 TABLET BY MOUTH EVERY DAY 90 tablet 3  . ibuprofen (ADVIL,MOTRIN) 200 MG tablet Take 200 mg by mouth every 4 (four) hours as needed for headache, mild pain or moderate pain.     . pantoprazole (PROTONIX) 40 MG tablet Take 40 mg by mouth daily.    . ramipril (ALTACE) 10 MG capsule Take 1 capsule (10 mg total) by mouth daily. 90 capsule 3  . sildenafil (REVATIO) 20 MG tablet 3-5 tablets daily as needed 50 tablet 3  . valACYclovir (VALTREX) 1000 MG tablet Take 1 tablet (1,000 mg total) by mouth 2 (two) times daily. 4 tablet 1  . venlafaxine XR (EFFEXOR-XR) 75 MG 24 hr capsule TAKE 1 CAPSULE BY MOUTH EVERY MORNING FOR 2 WEEKS, THEN 2 CAPSULES EVERY MORNING THEREAFTER 180 capsule 0   No current facility-administered medications on file prior to visit.    Allergies  Allergen Reactions  . Esomeprazole Magnesium Other (See Comments)    cramps  . Septra [Bactrim] Other (See Comments)    Back ache   Social History   Socioeconomic History  . Marital status: Widowed    Spouse name: Not on file  . Number of children: Not on file  .  Years of education: Not on file  . Highest education level: Not on file  Occupational History  . Occupation: Patent examiner: [employer]  . Financial resource strain: Not on file  . Food insecurity:    Worry: Not on file    Inability: Not on file  . Transportation needs:    Medical: Not on file    Non-medical: Not on file  Tobacco Use  . Smoking status: Former Smoker    Packs/day: 1.00    Years: 7.00    Pack years: 7.00    Types: Cigarettes    Last attempt to quit: 11/29/1975    Years since quitting: 42.3  . Smokeless tobacco: Never Used  Substance and Sexual Activity  .  Alcohol use: Yes    Alcohol/week: 1.0 oz    Types: 2 Standard drinks or equivalent per week  . Drug use: No  . Sexual activity: Not on file  Lifestyle  . Physical activity:    Days per week: Not on file    Minutes per session: Not on file  . Stress: Not on file  Relationships  . Social connections:    Talks on phone: Not on file    Gets together: Not on file    Attends religious service: Not on file    Active member of club or organization: Not on file    Attends meetings of clubs or organizations: Not on file    Relationship status: Not on file  . Intimate partner violence:    Fear of current or ex partner: Not on file    Emotionally abused: Not on file    Physically abused: Not on file    Forced sexual activity: Not on file  Other Topics Concern  . Not on file  Social History Narrative  . Not on file   Family History  Problem Relation Age of Onset  . Cancer Mother 32       cervical  . Cancer Father        lung  . Stroke Brother   . Atrial fibrillation Unknown     .p   Review of Systems  All other systems reviewed and are negative.      Objective:   Physical Exam  Constitutional: He is oriented to person, place, and time. He appears well-developed and well-nourished. No distress.  HENT:  Head: Normocephalic and atraumatic.  Right Ear: External ear normal.  Left Ear: External ear normal.  Nose: Nose normal.  Mouth/Throat: Oropharynx is clear and moist. No oropharyngeal exudate.  Eyes: Pupils are equal, round, and reactive to light. Conjunctivae and EOM are normal. Right eye exhibits no discharge. Left eye exhibits no discharge. No scleral icterus.  Neck: Normal range of motion. Neck supple. No JVD present. No tracheal deviation present. No thyromegaly present.  Cardiovascular: Normal rate, regular rhythm, normal heart sounds and intact distal pulses. Exam reveals no gallop and no friction rub.  No murmur heard. Pulmonary/Chest: Effort normal and breath  sounds normal. No stridor. No respiratory distress. He has no wheezes. He has no rales. He exhibits no tenderness.  Abdominal: Soft. Bowel sounds are normal. He exhibits no distension and no mass. There is no tenderness. There is no rebound and no guarding.  Musculoskeletal: Normal range of motion. He exhibits no edema or tenderness.  Lymphadenopathy:    He has no cervical adenopathy.  Neurological: He is alert and oriented to person, place, and time. He  has normal reflexes. No cranial nerve deficit. He exhibits normal muscle tone. Coordination normal.  Skin: Skin is warm. No rash noted. He is not diaphoretic. No erythema. No pallor.  Psychiatric: He has a normal mood and affect. His behavior is normal. Judgment and thought content normal.  Vitals reviewed.         Assessment & Plan:  General medical exam  HOCM (hypertrophic obstructive cardiomyopathy) ([place])  Essential hypertension  Depression, major, single episode, moderate ([facility])  Recommended Shingrix.  However I recommended he check on the price first prior to receiving this.  He has an appoint with the gastroenterologist in less than a month for rectal pressure.  Therefore we will defer digital rectal exam at this time.  The remainder of his physical exam is unremarkable.  The remainder of his review of systems is negative.  I will check a CBC, CMP, fasting lipid panel, and a PSA.  Regular anticipatory guidance is provided.

## 2018-03-27 ENCOUNTER — Encounter: Payer: Self-pay | Admitting: Family Medicine

## 2018-03-27 ENCOUNTER — Other Ambulatory Visit: Payer: Medicare Other

## 2018-03-27 LAB — PSA: PSA: 0.4 ng/mL (ref <=4.0)

## 2018-03-28 LAB — FECAL GLOBIN BY IMMUNOCHEMISTRY
FECAL GLOBIN RESULT:: NOT DETECTED
FECAL GLOBIN RESULT:: NOT DETECTED
FECAL GLOBIN RESULT:: NOT DETECTED
MICRO NUMBER:: 90525263
MICRO NUMBER:: 90525253
MICRO NUMBER:: 90525261
SPECIMEN QUALITY:: ADEQUATE
SPECIMEN QUALITY:: ADEQUATE
SPECIMEN QUALITY:: ADEQUATE

## 2018-04-06 ENCOUNTER — Other Ambulatory Visit: Payer: Self-pay | Admitting: Family Medicine

## 2018-04-16 DIAGNOSIS — K219 Gastro-esophageal reflux disease without esophagitis: Secondary | ICD-10-CM | POA: Diagnosis not present

## 2018-04-16 DIAGNOSIS — K2270 Barrett's esophagus without dysplasia: Secondary | ICD-10-CM | POA: Diagnosis not present

## 2018-04-16 DIAGNOSIS — Z86010 Personal history of colonic polyps: Secondary | ICD-10-CM | POA: Diagnosis not present

## 2018-04-16 DIAGNOSIS — R198 Other specified symptoms and signs involving the digestive system and abdomen: Secondary | ICD-10-CM | POA: Diagnosis not present

## 2018-04-19 ENCOUNTER — Other Ambulatory Visit: Payer: Self-pay | Admitting: Family Medicine

## 2018-04-19 NOTE — Telephone Encounter (Signed)
Patient would like a refill on his venlafaxine XR He states that he didn't get enough when he picked it up from [facility] on [address]. He states that he thinks the mistake came from the original prescription being written for 1 a day by mouth for two weeks and then take 2 capsules thereafter he didn't get enough for a 90 day supply.  CB#  venlafaxine XR

## 2018-04-20 NOTE — Telephone Encounter (Signed)
Call placed to patient he states pharmacy is only dispensing 167 capsules. I informed patient I would need to check with pharmacy patient states he has enough to hold him for now

## 2018-05-17 DIAGNOSIS — R198 Other specified symptoms and signs involving the digestive system and abdomen: Secondary | ICD-10-CM | POA: Diagnosis not present

## 2018-05-23 DIAGNOSIS — R102 Pelvic and perineal pain: Secondary | ICD-10-CM | POA: Diagnosis not present

## 2018-05-25 MED ORDER — VENLAFAXINE HCL ER 150 MG PO CP24: 150.0000 mg | ORAL_CAPSULE | Freq: Every day | ORAL | 3 refills | Status: DC

## 2018-05-25 NOTE — Telephone Encounter (Signed)
Called pt and changed Effexor to 150mg   1 tab po qd so pt only has to take 1 pill a day. Med sent to [facility].

## 2018-06-21 DIAGNOSIS — L814 Other melanin hyperpigmentation: Secondary | ICD-10-CM | POA: Diagnosis not present

## 2018-06-21 DIAGNOSIS — L821 Other seborrheic keratosis: Secondary | ICD-10-CM | POA: Diagnosis not present

## 2018-06-21 DIAGNOSIS — D1801 Hemangioma of skin and subcutaneous tissue: Secondary | ICD-10-CM | POA: Diagnosis not present

## 2018-06-21 DIAGNOSIS — L718 Other rosacea: Secondary | ICD-10-CM | POA: Diagnosis not present

## 2018-06-21 DIAGNOSIS — D225 Melanocytic nevi of trunk: Secondary | ICD-10-CM | POA: Diagnosis not present

## 2018-07-03 DIAGNOSIS — R198 Other specified symptoms and signs involving the digestive system and abdomen: Secondary | ICD-10-CM | POA: Diagnosis not present

## 2018-08-03 ENCOUNTER — Other Ambulatory Visit: Payer: Self-pay | Admitting: Family Medicine

## 2018-08-03 ENCOUNTER — Telehealth: Payer: Self-pay | Admitting: Family Medicine

## 2018-08-03 MED ORDER — PREDNISONE 20 MG PO TABS: ORAL_TABLET | ORAL | 0 refills | Status: DC

## 2018-08-03 NOTE — Telephone Encounter (Signed)
Pt states that he is having a gout flare up and wants [place] to call in steroid for him, please call it in to [facility] [address] if we can...   Please call if you have any further questions.

## 2018-08-03 NOTE — Telephone Encounter (Signed)
I call out prednisone.

## 2018-08-16 ENCOUNTER — Other Ambulatory Visit: Payer: Self-pay | Admitting: Family Medicine

## 2018-08-16 DIAGNOSIS — F321 Major depressive disorder, single episode, moderate: Secondary | ICD-10-CM

## 2018-08-16 MED ORDER — CLONAZEPAM 0.5 MG PO TABS: 0.5000 mg | ORAL_TABLET | Freq: Every evening | ORAL | 2 refills | Status: DC | PRN

## 2018-08-16 NOTE — Telephone Encounter (Signed)
Requesting refill    Klonopin  LOV: 03/26/18  LRF:  03/24/17

## 2018-09-05 ENCOUNTER — Ambulatory Visit (INDEPENDENT_AMBULATORY_CARE_PROVIDER_SITE_OTHER): Payer: Medicare Other | Admitting: Family Medicine

## 2018-09-05 DIAGNOSIS — Z23 Encounter for immunization: Secondary | ICD-10-CM

## 2018-10-11 ENCOUNTER — Encounter: Payer: Self-pay | Admitting: Cardiovascular Disease

## 2018-10-18 ENCOUNTER — Other Ambulatory Visit: Payer: Self-pay | Admitting: Cardiovascular Disease

## 2018-10-18 DIAGNOSIS — I421 Obstructive hypertrophic cardiomyopathy: Secondary | ICD-10-CM

## 2018-10-18 DIAGNOSIS — I10 Essential (primary) hypertension: Secondary | ICD-10-CM

## 2018-11-06 DIAGNOSIS — R198 Other specified symptoms and signs involving the digestive system and abdomen: Secondary | ICD-10-CM | POA: Diagnosis not present

## 2018-11-08 ENCOUNTER — Encounter: Payer: Self-pay | Admitting: Cardiovascular Disease

## 2018-11-08 ENCOUNTER — Encounter (INDEPENDENT_AMBULATORY_CARE_PROVIDER_SITE_OTHER): Payer: Self-pay

## 2018-11-08 ENCOUNTER — Ambulatory Visit (INDEPENDENT_AMBULATORY_CARE_PROVIDER_SITE_OTHER): Payer: Medicare Other | Admitting: Cardiovascular Disease

## 2018-11-08 VITALS — BP 140/76 | HR 72 | Ht 69.0 in | Wt 177.0 lb

## 2018-11-08 DIAGNOSIS — I10 Essential (primary) hypertension: Secondary | ICD-10-CM | POA: Diagnosis not present

## 2018-11-08 NOTE — Progress Notes (Signed)
Walter Cross Date of Birth  06-Dec-1950 [name] [address]    [place], [place]  [address] [phone]  Fax  [phone]   67 year old gentleman with a history of a mild hypertrophic obstructive cardiomyopathy. He is done very well on medical therapy. He's been on carvedilol and Cardizem. He does not have any episodes of chest pain or shortness breath. He exercises occasionally.  He's looking forward to getting up and walking work this summer and spring.  Apr 23, 2013:  Walter Cross presents with some episodes of chest pain last year. He had a stress Myoview study which was normal. He's had a recent echocardiogram which did not reveal any LVOT gradient  Walter Cross placed him on an acid blocker and he is feeling well.    Nov. 24, 2014:    he was having some CP the last time I saw him.    A stress Myoview study which revealed no evidence of ischemia. He had normal left ventricular systolic function with an ejection fraction of 81%.  His CP have resolved after being started her on an acid blocker by Dr. Delma Cross.  Dec. 1, 2015:  Walter Cross is doing well. No cardiac issues.  No further GERD issues.   Admits that he could use a bit more exercise.  Dec. 1, 2016:  Doing great.   Exercising well.  Has vivid dreams - he thinks its due to the coreg   Dec. 8, 2017:  Doing well.   BP is slightly elevated but typically is normal . Avoids salt .    Not exercising much .    No CP or dyspnea.    Dec. 12, 2018: Doing well .  No CP , has HOCM.  Asymptomatic   November 08, 2018: Seen today for follow-up of his  HTN and LVH   He remains asymptomatic. Overall is doing well  Not Exercising as much  No CP or dyspnea  Has some dizziness on occasion   Current Outpatient Medications on File Prior to Visit  Medication Sig Dispense Refill  . acyclovir ointment (ZOVIRAX) 5 % Apply 1 application topically daily as needed (cold sores & herpes). 15 g 1  . aspirin 81 MG tablet  Take 81 mg by mouth daily.      . carvedilol (COREG) 12.5 MG tablet Take 1 tablet (12.5 mg total) by mouth 2 (two) times daily with a meal. 180 tablet 3  . Cholecalciferol (VITAMIN D PO) Take 4,000 mg by mouth daily.    . clonazePAM (KLONOPIN) 0.5 MG tablet Take 1 tablet (0.5 mg total) by mouth at bedtime as needed for anxiety (sleep). 30 tablet 2  . Cyanocobalamin (VITAMIN B-12) 1000 MCG SUBL Place under the tongue.    . diltiazem (CARDIZEM CD) 180 MG 24 hr capsule TAKE 1 CAPSULE(180 MG) BY MOUTH DAILY 90 capsule 0  . ibuprofen (ADVIL,MOTRIN) 200 MG tablet Take 200 mg by mouth every 4 (four) hours as needed for headache, mild pain or moderate pain.     . pantoprazole (PROTONIX) 40 MG tablet Take 40 mg by mouth daily.    . predniSONE (DELTASONE) 20 MG tablet 3 tabs poqday 1-2, 2 tabs poqday 3-4, 1 tab poqday 5-6 (Patient taking differently: Take 20 mg by mouth daily as needed (OUT FLARE). ) 12 tablet 0  . ramipril (ALTACE) 10 MG capsule Take 1 capsule (10 mg total) by mouth daily. 90 capsule 3  . sildenafil (REVATIO) 20 MG tablet 3-5 tablets daily as  needed 50 tablet 3  . venlafaxine XR (EFFEXOR-XR) 150 MG 24 hr capsule Take 1 capsule (150 mg total) by mouth daily with breakfast. 90 capsule 3   No current facility-administered medications on file prior to visit.     Allergies  Allergen Reactions  . Esomeprazole Magnesium Other (See Comments)    cramps  . Septra [Bactrim] Other (See Comments)    Back ache    Past Medical History:  Diagnosis Date  . Adenomatous colon polyp 2015    Cross (q 5 yrs)   . Barrett's esophagus   . Benign colon polyp   . Depression   . Dermatitis    axillary  . 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 Genital HSV   . GERD (gastroesophageal reflux disease)   . Hx of cardiovascular stress test    ETT-Myoview 9/13: no ischemia, EF 81%  . Hyperlipidemia   . Hypertension   . Hypertrophic cardiomyopathy ([place])    a. Echo 2007: mod LVH, diast dysfn,  mild MR, no LVOT obstruction;  b. echo 9/13: upper septal thickening, no SAM of MV, no LVOT gradient, mild LVH, EF 65%  . Vitamin D deficiency     Past Surgical History:  Procedure Laterality Date  . CERVICAL SPINE SURGERY  [phone]   nerve release, Dr Walter Cross  . SPINE SURGERY     L4-L5, [phone]    Social History   Tobacco Use  Smoking Status Former Smoker  . Packs/day: 1.00  . Years: 7.00  . Pack years: 7.00  . Types: Cigarettes  . Last attempt to quit: 11/29/1975  . Years since quitting: 42.9  Smokeless Tobacco Never Used    Social History   Substance and Sexual Activity  Alcohol Use Yes  . Alcohol/week: 2.0 standard drinks  . Types: 2 Standard drinks or equivalent per week    Family History  Problem Relation Age of Onset  . Cancer Mother 90       cervical  . Cancer Father        lung  . Stroke Brother   . Atrial fibrillation Unknown     Reviw of Systems:  Reviewed in the HPI.  All other systems are negative.  Physical Exam: Blood pressure 140/76, pulse 72, height 5\' 9"  (1.753 m), weight 177 lb (80.3 kg), SpO2 96 %.  GEN:  Well nourished, well developed in no acute distress HEENT: Normal NECK: No JVD; No carotid bruits LYMPHATICS: No lymphadenopathy CARDIAC: RRR , no murmurs   RESPIRATORY:  Clear to auscultation without rales, wheezing or rhonchi  ABDOMEN: Soft, non-tender, non-distended MUSCULOSKELETAL:  No edema; No deformity  SKIN: Warm and dry NEUROLOGIC:  Alert and oriented x 3   EKG:   November 08, 2018: Normal sinus rhythm at 72.  Right axis deviation.  Otherwise normal EKG.  Assessment / Plan:   1.  HTN:   BP is well controlled.  Continue current medications. I encouraged him to exercise on a regular basis.  I given him the okay to take his medications at night.  He recently read that it may be beneficial to take the blood pressure medications at night.  I told him that starting an exercise program would probably offer him more than  taking his medications at one time versus the other.   Will see him in 1 year    Walter Moores, MD  11/08/2018 10:31 AM    [address],  [place], [place]  [address] Pager [phone] Phone: [phone]; Fax: [phone]

## 2018-11-08 NOTE — Patient Instructions (Signed)
Medication Instructions:  Your physician recommends that you continue on your current medications as directed. Please refer to the Current Medication list given to you today.  If you need a refill on your cardiac medications before your next appointment, please call your pharmacy.    Lab work: None Ordered  If you have labs (blood work) drawn today and your tests are completely normal, you will receive your results only by: . MyChart Message (if you have MyChart) OR . A paper copy in the mail If you have any lab test that is abnormal or we need to change your treatment, we will call you to review the results.   Testing/Procedures: None Ordered   Follow-Up: At [facility], you and your health needs are our priority.  As part of our continuing mission to provide you with exceptional heart care, we have created designated Provider Care Teams.  These Care Teams include your primary Cardiologist (physician) and Advanced Practice Providers (APPs -  Physician Assistants and Nurse Practitioners) who all work together to provide you with the care you need, when you need it. You will need a follow up appointment in:  1 years.  Please call our office 2 months in advance to schedule this appointment.  You may see Philip Nahser, MD or one of the following Advanced Practice Providers on your designated Care Team: Scott Weaver, PA-C Vin Bhagat, PA-C . Janine Hammond, NP    

## 2018-11-13 ENCOUNTER — Telehealth: Payer: Self-pay | Admitting: Family Medicine

## 2018-11-13 NOTE — Telephone Encounter (Signed)
Pt has ?'s regarding dosage of venlafaxine please call him back.

## 2018-11-14 MED ORDER — VENLAFAXINE HCL ER 75 MG PO CP24: 150.0000 mg | ORAL_CAPSULE | Freq: Every day | ORAL | 3 refills | Status: DC

## 2018-11-14 NOTE — Telephone Encounter (Signed)
LMTRC

## 2018-11-14 NOTE — Telephone Encounter (Signed)
Pt called and states that the 150mg  of Effexor makes him dizzy with some fatigue and would like to go back on the 75mg  bid like before as that did not bother him. Med sent to pharm. Also recomeded if ins does not cover 75mg  bid he may could try taking the 150mg  at night.

## 2018-12-17 ENCOUNTER — Other Ambulatory Visit: Payer: Self-pay | Admitting: Cardiovascular Disease

## 2018-12-17 DIAGNOSIS — I421 Obstructive hypertrophic cardiomyopathy: Secondary | ICD-10-CM

## 2018-12-17 DIAGNOSIS — I10 Essential (primary) hypertension: Secondary | ICD-10-CM

## 2018-12-27 ENCOUNTER — Other Ambulatory Visit: Payer: Self-pay | Admitting: *Deleted

## 2018-12-27 MED ORDER — CARVEDILOL 12.5 MG PO TABS: 12.5000 mg | ORAL_TABLET | Freq: Two times a day (BID) | ORAL | 3 refills | Status: DC

## 2019-01-17 ENCOUNTER — Telehealth: Payer: Self-pay | Admitting: Family Medicine

## 2019-01-17 ENCOUNTER — Other Ambulatory Visit: Payer: Self-pay | Admitting: Family Medicine

## 2019-01-17 MED ORDER — PREDNISONE 20 MG PO TABS: ORAL_TABLET | ORAL | 0 refills | Status: DC

## 2019-01-17 NOTE — Telephone Encounter (Signed)
Pt is having a gout flare and would like some prednisone if possible?

## 2019-01-17 NOTE — Telephone Encounter (Signed)
[facility]

## 2019-03-26 DIAGNOSIS — R198 Other specified symptoms and signs involving the digestive system and abdomen: Secondary | ICD-10-CM | POA: Diagnosis not present

## 2019-04-01 ENCOUNTER — Encounter: Payer: Self-pay | Admitting: Family Medicine

## 2019-04-01 ENCOUNTER — Other Ambulatory Visit: Payer: Self-pay

## 2019-04-01 ENCOUNTER — Ambulatory Visit (INDEPENDENT_AMBULATORY_CARE_PROVIDER_SITE_OTHER): Payer: Medicare Other | Admitting: Family Medicine

## 2019-04-01 VITALS — BP 128/70 | HR 70 | Temp 98.1°F | Resp 16 | Ht 69.0 in | Wt 178.0 lb

## 2019-04-01 DIAGNOSIS — Z125 Encounter for screening for malignant neoplasm of prostate: Secondary | ICD-10-CM

## 2019-04-01 DIAGNOSIS — Z0001 Encounter for general adult medical examination with abnormal findings: Secondary | ICD-10-CM | POA: Diagnosis not present

## 2019-04-01 DIAGNOSIS — I10 Essential (primary) hypertension: Secondary | ICD-10-CM | POA: Diagnosis not present

## 2019-04-01 DIAGNOSIS — I421 Obstructive hypertrophic cardiomyopathy: Secondary | ICD-10-CM | POA: Diagnosis not present

## 2019-04-01 DIAGNOSIS — F321 Major depressive disorder, single episode, moderate: Secondary | ICD-10-CM | POA: Diagnosis not present

## 2019-04-01 DIAGNOSIS — Z136 Encounter for screening for cardiovascular disorders: Secondary | ICD-10-CM

## 2019-04-01 DIAGNOSIS — Z0000 Encounter for general adult medical examination without abnormal findings: Secondary | ICD-10-CM

## 2019-04-01 NOTE — Progress Notes (Signed)
Subjective:    Patient ID: Walter Cross, male    DOB: [birth year], 68 y.o.   MRN: [id number]  HPI Patient is here today for complete physical exam.  At the patient's last physical, he was complaining of rectal pressure.  I referred him to see his gastroenterologist.  Per the patient's report they did a sigmoidoscopy and no abnormalities were found.  They tried stool softeners.  They tried topical diltiazem for rectal spasms.  Patient has seen no benefit with either of these.  He has been referred to a rectal specialist by his gastroenterologist.  However his colonoscopy is up-to-date.  I performed a rectal exam today.  There are no palpable masses.  Prostate is slightly enlarged but there is no nodularity.  His last PSA was 0.4 last year and he is due again for PSA today.  Immunizations are up-to-date aside for the shingles vaccine which we again discussed today and I recommended that he check with his insurance first.  I reviewed his ADLs assessment as part of his annual wellness visit questionnaire.  He denies any difficulty with ADLs.  He is drinking 3-4 alcoholic beverages a day.  I recommended decreasing that to less than 2.  He is not smoking however he did smoke 30 years ago in the past.  Therefore he is due for AAA screening.  He denies any issues with falls.  He states that his depression is currently well controlled on the venlafaxine.  He does occasionally have insomnia but for the most part he is doing well from the standpoint.  He denies any chest pain shortness of breath or dyspnea on exertion.  He denies any difficulties with his hearing or his vision.  Aside from the perirectal pressure he denies any abnormalities Immunization History  Administered Date(s) Administered  . Influenza, High Dose Seasonal PF 09/05/2018  . Influenza,inj,Quad PF,6+ Mos 09/20/2013, 11/19/2014, 09/17/2015, 09/13/2016, 11/03/2017  . Pneumococcal Conjugate-13 03/18/2016  . Pneumococcal Polysaccharide-23  03/17/2015  . Tdap 02/16/2011    Past Surgical History:  Procedure Laterality Date  . CERVICAL SPINE SURGERY  [phone]   nerve release, Dr Hal Neer  . SPINE SURGERY     L4-L5, [phone]   Current Outpatient Medications on File Prior to Visit  Medication Sig Dispense Refill  . acyclovir ointment (ZOVIRAX) 5 % Apply 1 application topically daily as needed (cold sores & herpes). 15 g 1  . aspirin 81 MG tablet Take 81 mg by mouth daily.      Marland Kitchen CARTIA XT 180 MG 24 hr capsule TAKE 1 CAPSULE(180 MG) BY MOUTH DAILY 90 capsule 3  . carvedilol (COREG) 12.5 MG tablet Take 1 tablet (12.5 mg total) by mouth 2 (two) times daily with a meal. 180 tablet 3  . Cholecalciferol (VITAMIN D PO) Take 4,000 mg by mouth daily.    . clonazePAM (KLONOPIN) 0.5 MG tablet Take 1 tablet (0.5 mg total) by mouth at bedtime as needed for anxiety (sleep). 30 tablet 2  . Cyanocobalamin (VITAMIN B-12) 1000 MCG SUBL Place under the tongue.    Marland Kitchen ibuprofen (ADVIL,MOTRIN) 200 MG tablet Take 200 mg by mouth every 4 (four) hours as needed for headache, mild pain or moderate pain.     . pantoprazole (PROTONIX) 40 MG tablet Take 40 mg by mouth daily.    . predniSONE (DELTASONE) 20 MG tablet 3 tabs poqday 1-2, 2 tabs poqday 3-4, 1 tab poqday 5-6 (Patient taking differently: Take 20 mg by mouth daily as needed (OUT FLARE). )  12 tablet 0  . ramipril (ALTACE) 10 MG capsule Take 1 capsule (10 mg total) by mouth daily. 90 capsule 3  . sildenafil (REVATIO) 20 MG tablet 3-5 tablets daily as needed 50 tablet 3  . venlafaxine XR (EFFEXOR-XR) 150 MG 24 hr capsule Take 1 capsule (150 mg total) by mouth daily with breakfast. 90 capsule 3   No current facility-administered medications on file prior to visit.    Allergies  Allergen Reactions  . Esomeprazole Magnesium Other (See Comments)    cramps  . Septra [Bactrim] Other (See Comments)    Back ache   Social History   Socioeconomic History  . Marital status: Widowed    Spouse name:  Not on file  . Number of children: Not on file  . Years of education: Not on file  . Highest education level: Not on file  Occupational History  . Occupation: Patent examiner: [employer]  . Financial resource strain: Not on file  . Food insecurity:    Worry: Not on file    Inability: Not on file  . Transportation needs:    Medical: Not on file    Non-medical: Not on file  Tobacco Use  . Smoking status: Former Smoker    Packs/day: 1.00    Years: 7.00    Pack years: 7.00    Types: Cigarettes    Last attempt to quit: 11/29/1975    Years since quitting: 43.3  . Smokeless tobacco: Never Used  Substance and Sexual Activity  . Alcohol use: Yes    Alcohol/week: 2.0 standard drinks    Types: 2 Standard drinks or equivalent per week  . Drug use: No  . Sexual activity: Not on file  Lifestyle  . Physical activity:    Days per week: Not on file    Minutes per session: Not on file  . Stress: Not on file  Relationships  . Social connections:    Talks on phone: Not on file    Gets together: Not on file    Attends religious service: Not on file    Active member of club or organization: Not on file    Attends meetings of clubs or organizations: Not on file    Relationship status: Not on file  . Intimate partner violence:    Fear of current or ex partner: Not on file    Emotionally abused: Not on file    Physically abused: Not on file    Forced sexual activity: Not on file  Other Topics Concern  . Not on file  Social History Narrative  . Not on file   Family History  Problem Relation Age of Onset  . Cancer Mother 26       cervical  . Cancer Father        lung  . Stroke Brother   . Atrial fibrillation Unknown     .p   Review of Systems  All other systems reviewed and are negative.      Objective:   Physical Exam  Constitutional: He is oriented to person, place, and time. He appears well-developed and well-nourished.  No distress.  HENT:  Head: Normocephalic and atraumatic.  Right Ear: External ear normal.  Left Ear: External ear normal.  Nose: Nose normal.  Mouth/Throat: Oropharynx is clear and moist. No oropharyngeal exudate.  Eyes: Pupils are equal, round, and reactive to light. Conjunctivae and EOM are normal. Right eye exhibits no discharge. Left  eye exhibits no discharge. No scleral icterus.  Neck: Normal range of motion. Neck supple. No JVD present. No tracheal deviation present. No thyromegaly present.  Cardiovascular: Normal rate, regular rhythm, normal heart sounds and intact distal pulses. Exam reveals no gallop and no friction rub.  No murmur heard. Pulmonary/Chest: Effort normal and breath sounds normal. No stridor. No respiratory distress. He has no wheezes. He has no rales. He exhibits no tenderness.  Abdominal: Soft. Bowel sounds are normal. He exhibits no distension and no mass. There is no abdominal tenderness. There is no rebound and no guarding.  Genitourinary:    Prostate and rectum normal.   Musculoskeletal: Normal range of motion.        General: No tenderness or edema.  Lymphadenopathy:    He has no cervical adenopathy.  Neurological: He is alert and oriented to person, place, and time. He has normal reflexes. No cranial nerve deficit. He exhibits normal muscle tone. Coordination normal.  Skin: Skin is warm. No rash noted. He is not diaphoretic. No erythema. No pallor.  Psychiatric: He has a normal mood and affect. His behavior is normal. Judgment and thought content normal.  Vitals reviewed.         Assessment & Plan:  General medical exam - Plan: CBC with Differential/Platelet, COMPLETE METABOLIC PANEL WITH GFR, Lipid panel, PSA  HOCM (hypertrophic obstructive cardiomyopathy) ([place]) - Plan: CBC with Differential/Platelet, COMPLETE METABOLIC PANEL WITH GFR, Lipid panel  Essential hypertension - Plan: CBC with Differential/Platelet, COMPLETE METABOLIC PANEL WITH GFR, Lipid  panel  Depression, major, single episode, moderate (HCC)  Prostate cancer screening - Plan: PSA I again recommended the patient receive the shingles vaccine, Shingrix.  The remainder of his vaccinations are up-to-date.  His cancer screening is up-to-date but I will check a PSA as part of his prostate cancer screening.  His blood pressure today is outstanding.  I will check a CBC to screen for anemia or any bone marrow abnormalities.  I will monitor his renal kidney function with a CMP and I will check a fasting lipid panel given his history of hypertension.  His depression is currently well controlled.  Therefore I will not make any changes in his medication.  Patient will follow-up with rectal specialist as recommended by his gastroenterologist.  I will schedule the patient for AAA screening with an abdominal ultrasound given his age greater than 21, his history of hypertension, and his history of smoking.

## 2019-04-02 LAB — CBC WITH DIFFERENTIAL/PLATELET
Absolute Monocytes: 689 cells/uL (ref 200–950)
Basophils Absolute: 49 cells/uL (ref 0–200)
Basophils Relative: 0.8 %
Eosinophils Absolute: 79 cells/uL (ref 15–500)
Eosinophils Relative: 1.3 %
HCT: 45.6 % (ref 38.5–50.0)
Hemoglobin: 16 g/dL (ref 13.2–17.1)
Lymphs Abs: 1086 cells/uL (ref 850–3900)
MCH: 34.3 pg — ABNORMAL HIGH (ref 27.0–33.0)
MCHC: 35.1 g/dL (ref 32.0–36.0)
MCV: 97.6 fL (ref 80.0–100.0)
MPV: 10.5 fL (ref 7.5–12.5)
Monocytes Relative: 11.3 %
Neutro Abs: 4197 cells/uL (ref 1500–7800)
Neutrophils Relative %: 68.8 %
Platelets: 227 10*3/uL (ref 140–400)
RBC: 4.67 10*6/uL (ref 4.20–5.80)
RDW: 13.2 % (ref 11.0–15.0)
Total Lymphocyte: 17.8 %
WBC: 6.1 10*3/uL (ref 3.8–10.8)

## 2019-04-02 LAB — COMPLETE METABOLIC PANEL WITH GFR
AG Ratio: 1.5 (calc) (ref 1.0–2.5)
ALT: 22 U/L (ref 9–46)
AST: 25 U/L (ref 10–35)
Albumin: 4.3 g/dL (ref 3.6–5.1)
Alkaline phosphatase (APISO): 77 U/L (ref 35–144)
BUN: 10 mg/dL (ref 7–25)
CO2: 27 mmol/L (ref 20–32)
Calcium: 9.3 mg/dL (ref 8.6–10.3)
Chloride: 100 mmol/L (ref 98–110)
Creat: 0.77 mg/dL (ref 0.70–1.25)
GFR, Est African American: 107 mL/min/{1.73_m2} (ref >=60)
GFR, Est Non African American: 93 mL/min/{1.73_m2} (ref >=60)
Globulin: 2.9 g/dL (calc) (ref 1.9–3.7)
Glucose, Bld: 99 mg/dL (ref 65–99)
Potassium: 4.6 mmol/L (ref 3.5–5.3)
Sodium: 139 mmol/L (ref 135–146)
Total Bilirubin: 0.7 mg/dL (ref 0.2–1.2)
Total Protein: 7.2 g/dL (ref 6.1–8.1)

## 2019-04-02 LAB — LIPID PANEL
Cholesterol: 194 mg/dL (ref <200)
HDL: 65 mg/dL (ref >=40)
LDL Cholesterol (Calc): 97 mg/dL (calc)
Non-HDL Cholesterol (Calc): 129 mg/dL (calc) (ref <130)
Total CHOL/HDL Ratio: 3 (calc) (ref <5.0)
Triglycerides: 229 mg/dL — ABNORMAL HIGH (ref <150)

## 2019-04-02 LAB — PSA: PSA: 0.7 ng/mL (ref <=4.0)

## 2019-04-10 ENCOUNTER — Ambulatory Visit (HOSPITAL_COMMUNITY)
Admission: RE | Admit: 2019-04-10 | Discharge: 2019-04-10 | Disposition: A | Discharge status: Home / Self Care | Payer: Medicare Other | Source: Ambulatory Visit | Attending: Family | Admitting: Family

## 2019-04-10 ENCOUNTER — Other Ambulatory Visit: Payer: Self-pay

## 2019-04-10 DIAGNOSIS — Z136 Encounter for screening for cardiovascular disorders: Secondary | ICD-10-CM | POA: Diagnosis not present

## 2019-04-11 ENCOUNTER — Other Ambulatory Visit: Payer: Self-pay | Admitting: Family Medicine

## 2019-04-30 ENCOUNTER — Telehealth: Payer: Self-pay | Admitting: Family Medicine

## 2019-04-30 MED ORDER — PREDNISONE 20 MG PO TABS: ORAL_TABLET | ORAL | 0 refills | Status: DC

## 2019-04-30 NOTE — Telephone Encounter (Signed)
Okay to refill prednisone taper

## 2019-04-30 NOTE — Telephone Encounter (Signed)
Medication called/sent to requested pharmacy and pt aware 

## 2019-04-30 NOTE — Telephone Encounter (Signed)
Pt called and states that he is having a flare in his gout and would like to know if we can send him in some prednisone?

## 2019-05-16 DIAGNOSIS — I10 Essential (primary) hypertension: Secondary | ICD-10-CM | POA: Diagnosis not present

## 2019-05-16 DIAGNOSIS — H35033 Hypertensive retinopathy, bilateral: Secondary | ICD-10-CM | POA: Diagnosis not present

## 2019-05-16 DIAGNOSIS — H43393 Other vitreous opacities, bilateral: Secondary | ICD-10-CM | POA: Diagnosis not present

## 2019-05-16 DIAGNOSIS — H52221 Regular astigmatism, right eye: Secondary | ICD-10-CM | POA: Diagnosis not present

## 2019-05-16 DIAGNOSIS — H524 Presbyopia: Secondary | ICD-10-CM | POA: Diagnosis not present

## 2019-05-16 DIAGNOSIS — H2513 Age-related nuclear cataract, bilateral: Secondary | ICD-10-CM | POA: Diagnosis not present

## 2019-05-16 DIAGNOSIS — H5203 Hypermetropia, bilateral: Secondary | ICD-10-CM | POA: Diagnosis not present

## 2019-05-20 DIAGNOSIS — R102 Pelvic and perineal pain: Secondary | ICD-10-CM | POA: Diagnosis not present

## 2019-05-20 DIAGNOSIS — R198 Other specified symptoms and signs involving the digestive system and abdomen: Secondary | ICD-10-CM | POA: Diagnosis not present

## 2019-05-20 DIAGNOSIS — R309 Painful micturition, unspecified: Secondary | ICD-10-CM | POA: Diagnosis not present

## 2019-06-18 ENCOUNTER — Other Ambulatory Visit: Payer: Self-pay | Admitting: Family Medicine

## 2019-07-01 DIAGNOSIS — L718 Other rosacea: Secondary | ICD-10-CM | POA: Diagnosis not present

## 2019-07-01 DIAGNOSIS — D225 Melanocytic nevi of trunk: Secondary | ICD-10-CM | POA: Diagnosis not present

## 2019-07-01 DIAGNOSIS — L304 Erythema intertrigo: Secondary | ICD-10-CM | POA: Diagnosis not present

## 2019-07-01 DIAGNOSIS — L821 Other seborrheic keratosis: Secondary | ICD-10-CM | POA: Diagnosis not present

## 2019-07-01 DIAGNOSIS — D1801 Hemangioma of skin and subcutaneous tissue: Secondary | ICD-10-CM | POA: Diagnosis not present

## 2019-07-01 DIAGNOSIS — L814 Other melanin hyperpigmentation: Secondary | ICD-10-CM | POA: Diagnosis not present

## 2019-07-30 DIAGNOSIS — N401 Enlarged prostate with lower urinary tract symptoms: Secondary | ICD-10-CM | POA: Diagnosis not present

## 2019-07-30 DIAGNOSIS — N411 Chronic prostatitis: Secondary | ICD-10-CM | POA: Diagnosis not present

## 2019-07-30 DIAGNOSIS — R300 Dysuria: Secondary | ICD-10-CM | POA: Diagnosis not present

## 2019-08-27 DIAGNOSIS — R300 Dysuria: Secondary | ICD-10-CM | POA: Diagnosis not present

## 2019-08-27 DIAGNOSIS — N401 Enlarged prostate with lower urinary tract symptoms: Secondary | ICD-10-CM | POA: Diagnosis not present

## 2019-10-17 ENCOUNTER — Other Ambulatory Visit: Payer: Self-pay

## 2019-10-17 DIAGNOSIS — Z20822 Contact with and (suspected) exposure to covid-19: Secondary | ICD-10-CM

## 2019-10-17 DIAGNOSIS — Z20828 Contact with and (suspected) exposure to other viral communicable diseases: Secondary | ICD-10-CM | POA: Diagnosis not present

## 2019-10-20 LAB — NOVEL CORONAVIRUS, NAA: SARS-CoV-2, NAA: NOT DETECTED

## 2019-11-05 ENCOUNTER — Ambulatory Visit (INDEPENDENT_AMBULATORY_CARE_PROVIDER_SITE_OTHER): Payer: Medicare Other

## 2019-11-05 ENCOUNTER — Other Ambulatory Visit: Payer: Self-pay

## 2019-11-05 DIAGNOSIS — Z23 Encounter for immunization: Secondary | ICD-10-CM

## 2019-12-05 ENCOUNTER — Other Ambulatory Visit: Payer: Self-pay

## 2019-12-05 ENCOUNTER — Encounter: Payer: Self-pay | Admitting: Cardiovascular Disease

## 2019-12-05 ENCOUNTER — Ambulatory Visit (INDEPENDENT_AMBULATORY_CARE_PROVIDER_SITE_OTHER): Payer: Medicare Other | Admitting: Cardiovascular Disease

## 2019-12-05 DIAGNOSIS — I10 Essential (primary) hypertension: Secondary | ICD-10-CM

## 2019-12-05 DIAGNOSIS — I517 Cardiomegaly: Secondary | ICD-10-CM | POA: Diagnosis not present

## 2019-12-05 DIAGNOSIS — I421 Obstructive hypertrophic cardiomyopathy: Secondary | ICD-10-CM | POA: Diagnosis not present

## 2019-12-05 MED ORDER — SILDENAFIL CITRATE 20 MG PO TABS: ORAL_TABLET | ORAL | 6 refills | Status: AC

## 2019-12-05 MED ORDER — SILDENAFIL CITRATE 20 MG PO TABS: ORAL_TABLET | ORAL | 3 refills | Status: DC

## 2019-12-05 NOTE — Patient Instructions (Signed)

## 2019-12-05 NOTE — Progress Notes (Signed)
Walter Cross Date of Birth  [birth year] [facility] [id number]    [place], [place]  [address] [phone]  Fax  [phone]   69 year old gentleman with a history of a mild LVH. He is done very well on medical therapy. He's been on carvedilol and Cardizem. He does not have any episodes of chest pain or shortness breath. He exercises occasionally.  He's looking forward to getting up and walking work this summer and spring.  Apr 23, 2013:  Lowry presents with some episodes of chest pain last year. He had a stress Myoview study which was normal. He's had a recent echocardiogram which did not reveal any LVOT gradient  [name] placed him on an acid blocker and he is feeling well.    Nov. 24, 2014:    he was having some CP the last time I saw him.    A stress Myoview study which revealed no evidence of ischemia. He had normal left ventricular systolic function with an ejection fraction of 81%.  His CP have resolved after being started her on an acid blocker by Dr. [name].  Dec. 1, 2015:  [name] is doing well. No cardiac issues.  No further GERD issues.   Admits that he could use a bit more exercise.  Dec. 1, 2016:  Doing great.   Exercising well.  Has vivid dreams - he thinks its due to the coreg   Dec. 8, 2017:  Doing well.   BP is slightly elevated but typically is normal . Avoids salt .    Not exercising much .    No CP or dyspnea.    Dec. 12, 2018: Doing well .  No CP ,.  Asymptomatic   November 08, 2018: Seen today for follow-up of his  HTN and LVH   He remains asymptomatic. Overall is doing well  Not Exercising as much  No CP or dyspnea  Has some dizziness on occasion   Jan. 7, 2021 Alessander is seen today for follow up of his HTN and mild LVH on echo  Has some DOE  Not exercising regularly .  Still working on his 2002 .    Current Outpatient Medications on File Prior to Visit  Medication Sig Dispense Refill  . acyclovir ointment  (ZOVIRAX) 5 % Apply 1 application topically daily as needed (cold sores & herpes). 15 g 1  . aspirin 81 MG tablet Take 81 mg by mouth daily.      Marland Kitchen CARTIA XT 180 MG 24 hr capsule TAKE 1 CAPSULE(180 MG) BY MOUTH DAILY 90 capsule 3  . carvedilol (COREG) 12.5 MG tablet Take 1 tablet (12.5 mg total) by mouth 2 (two) times daily with a meal. 180 tablet 3  . Cholecalciferol (VITAMIN D PO) Take 4,000 mg by mouth daily.    . clonazePAM (KLONOPIN) 0.5 MG tablet Take 1 tablet (0.5 mg total) by mouth at bedtime as needed for anxiety (sleep). 30 tablet 2  . Cyanocobalamin (VITAMIN B-12) 1000 MCG SUBL Place under the tongue.    Marland Kitchen ibuprofen (ADVIL,MOTRIN) 200 MG tablet Take 200 mg by mouth every 4 (four) hours as needed for headache, mild pain or moderate pain.     . pantoprazole (PROTONIX) 40 MG tablet Take 40 mg by mouth daily.    . predniSONE (DELTASONE) 20 MG tablet 3 tabs poqday 1-2, 2 tabs poqday 3-4, 1 tab poqday 5-6 12 tablet 0  . ramipril (ALTACE) 10 MG capsule TAKE 1 CAPSULE(10 MG) BY  MOUTH DAILY 90 capsule 3  . sildenafil (REVATIO) 20 MG tablet 3-5 tablets daily as needed 50 tablet 3  . venlafaxine XR (EFFEXOR-XR) 150 MG 24 hr capsule TAKE 1 CAPSULE(150 MG) BY MOUTH DAILY WITH BREAKFAST 90 capsule 3   No current facility-administered medications on file prior to visit.    Allergies  Allergen Reactions  . Esomeprazole Magnesium Other (See Comments)    cramps  . Septra [Bactrim] Other (See Comments)    Back ache    Past Medical History:  Diagnosis Date  . Adenomatous colon polyp 2015    Buccini (q 5 yrs)   . Barrett's esophagus   . Benign colon polyp   . Depression   . Dermatitis    axillary  . 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 Genital HSV   . GERD (gastroesophageal reflux disease)   . Hx of cardiovascular stress test    ETT-Myoview 9/13: no ischemia, EF 81%  . Hyperlipidemia   . Hypertension   . Hypertrophic cardiomyopathy ([place])    a. Echo 2007: mod LVH, diast dysfn,  mild MR, no LVOT obstruction;  b. echo 9/13: upper septal thickening, no SAM of MV, no LVOT gradient, mild LVH, EF 65%  . Vitamin D deficiency     Past Surgical History:  Procedure Laterality Date  . CERVICAL SPINE SURGERY  [id number]   nerve release, Dr Hal Neer  . SPINE SURGERY     L4-L5, [phone]    Social History   Tobacco Use  Smoking Status Former Smoker  . Packs/day: 1.00  . Years: 7.00  . Pack years: 7.00  . Types: Cigarettes  . Quit date: 11/29/1975  . Years since quitting: 44.0  Smokeless Tobacco Never Used    Social History   Substance and Sexual Activity  Alcohol Use Yes  . Alcohol/week: 2.0 standard drinks  . Types: 2 Standard drinks or equivalent per week    Family History  Problem Relation Age of Onset  . Cancer Mother 41       cervical  . Cancer Father        lung  . Stroke Brother   . Atrial fibrillation Other     Reviw of Systems:  Reviewed in the HPI.  All other systems are negative.  Physical Exam: There were no vitals taken for this visit.  GEN:  Well nourished, well developed in no acute distress HEENT: Normal NECK: No JVD; No carotid bruits LYMPHATICS: No lymphadenopathy CARDIAC: RRR , no murmurs, rubs, gallops RESPIRATORY:  Clear to auscultation without rales, wheezing or rhonchi  ABDOMEN: Soft, non-tender, non-distended MUSCULOSKELETAL:  No edema; No deformity  SKIN: Warm and dry NEUROLOGIC:  Alert and oriented x 3    EKG:   December 05, 2019: Normal sinus rhythm at 84.  No ST or T wave changes.  Assessment / Plan:   1.  HTN:   Blood pressure is well controlled.  Continue current medications. He needs to exercise more   Will see him in 1 year.     [name], MD  12/05/2019 6:34 AM    [address],  [place], [place]  [address] Pager [phone] Phone: [phone]; Fax: [phone]

## 2019-12-09 ENCOUNTER — Telehealth: Payer: Self-pay

## 2019-12-09 NOTE — Telephone Encounter (Signed)
**Note De-Identified Willia Lampert Obfuscation** I started a Sildenafil PA through covermymeds. Key: [id number]

## 2019-12-10 NOTE — Telephone Encounter (Signed)
**Note De-Identified Lewis Keats Obfuscation** Letter received from [facility] stating that they have denied coverage of the pts Sildenafil. Reason: The medicare rules in the prescription drug manuel says drugs used to treat ED are excluded from Medicare Part D coverage. [employer] follows the rules set by Part D.  The letter states that they have notified the pt of this denial as well.

## 2019-12-11 DIAGNOSIS — Z1159 Encounter for screening for other viral diseases: Secondary | ICD-10-CM | POA: Diagnosis not present

## 2019-12-13 ENCOUNTER — Telehealth: Payer: Self-pay | Admitting: Family Medicine

## 2019-12-13 NOTE — Telephone Encounter (Signed)
Pt had to have a COVID test for EGD & Colonoscopy that resulted in a positive result. He has no symptoms whatsoever and was told to contact his PCP. Per Dr. Dennard Schaumann positive recommendations are for pt to quarantine for 14 days and if no symptoms develops then he can return to normal daily life. Pt informed to go to the er if he develops SOB or chest pain. Pt verbalizes understanding.

## 2019-12-13 NOTE — Telephone Encounter (Signed)
Patient has a question about getting a second covid test  Would like a call back at [phone]

## 2020-01-01 ENCOUNTER — Other Ambulatory Visit: Payer: Self-pay | Admitting: *Deleted

## 2020-01-01 DIAGNOSIS — I421 Obstructive hypertrophic cardiomyopathy: Secondary | ICD-10-CM

## 2020-01-01 DIAGNOSIS — I10 Essential (primary) hypertension: Secondary | ICD-10-CM

## 2020-01-01 MED ORDER — CARVEDILOL 12.5 MG PO TABS: 12.5000 mg | ORAL_TABLET | Freq: Two times a day (BID) | ORAL | 3 refills | Status: DC

## 2020-01-01 MED ORDER — DILTIAZEM HCL ER COATED BEADS 180 MG PO CP24: ORAL_CAPSULE | ORAL | 3 refills | Status: DC

## 2020-01-01 NOTE — Addendum Note (Signed)
Addended by: Claude Manges on: 01/01/2020 10:31 AM   Modules accepted: Orders

## 2020-02-11 DIAGNOSIS — Z03818 Encounter for observation for suspected exposure to other biological agents ruled out: Secondary | ICD-10-CM | POA: Diagnosis not present

## 2020-03-08 ENCOUNTER — Other Ambulatory Visit: Payer: Self-pay | Admitting: Family Medicine

## 2020-03-08 DIAGNOSIS — F321 Major depressive disorder, single episode, moderate: Secondary | ICD-10-CM

## 2020-03-09 NOTE — Telephone Encounter (Signed)
Requesting refill  Klonopin  LOV:  04/01/2019  LRF:  08/16/2018

## 2020-03-12 ENCOUNTER — Other Ambulatory Visit: Payer: Self-pay | Admitting: Family Medicine

## 2020-03-31 ENCOUNTER — Other Ambulatory Visit: Payer: Self-pay | Admitting: Family Medicine

## 2020-05-27 DIAGNOSIS — G5622 Lesion of ulnar nerve, left upper limb: Secondary | ICD-10-CM | POA: Insufficient documentation

## 2020-06-10 DIAGNOSIS — Z1159 Encounter for screening for other viral diseases: Secondary | ICD-10-CM | POA: Diagnosis not present

## 2020-06-15 DIAGNOSIS — D120 Benign neoplasm of cecum: Secondary | ICD-10-CM | POA: Diagnosis not present

## 2020-06-15 DIAGNOSIS — K2270 Barrett's esophagus without dysplasia: Secondary | ICD-10-CM | POA: Diagnosis not present

## 2020-06-15 DIAGNOSIS — D123 Benign neoplasm of transverse colon: Secondary | ICD-10-CM | POA: Diagnosis not present

## 2020-06-15 DIAGNOSIS — Z86010 Personal history of colonic polyps: Secondary | ICD-10-CM | POA: Diagnosis not present

## 2020-06-18 DIAGNOSIS — D123 Benign neoplasm of transverse colon: Secondary | ICD-10-CM | POA: Diagnosis not present

## 2020-06-18 DIAGNOSIS — D120 Benign neoplasm of cecum: Secondary | ICD-10-CM | POA: Diagnosis not present

## 2020-06-29 DIAGNOSIS — D1801 Hemangioma of skin and subcutaneous tissue: Secondary | ICD-10-CM | POA: Diagnosis not present

## 2020-06-29 DIAGNOSIS — L718 Other rosacea: Secondary | ICD-10-CM | POA: Diagnosis not present

## 2020-06-29 DIAGNOSIS — D225 Melanocytic nevi of trunk: Secondary | ICD-10-CM | POA: Diagnosis not present

## 2020-06-29 DIAGNOSIS — L821 Other seborrheic keratosis: Secondary | ICD-10-CM | POA: Diagnosis not present

## 2020-06-29 DIAGNOSIS — L814 Other melanin hyperpigmentation: Secondary | ICD-10-CM | POA: Diagnosis not present

## 2020-06-29 DIAGNOSIS — L570 Actinic keratosis: Secondary | ICD-10-CM | POA: Diagnosis not present

## 2020-06-29 DIAGNOSIS — L905 Scar conditions and fibrosis of skin: Secondary | ICD-10-CM | POA: Diagnosis not present

## 2020-06-29 DIAGNOSIS — Z872 Personal history of diseases of the skin and subcutaneous tissue: Secondary | ICD-10-CM | POA: Diagnosis not present

## 2020-07-08 DIAGNOSIS — K22711 Barrett's esophagus with high grade dysplasia: Secondary | ICD-10-CM | POA: Diagnosis not present

## 2020-07-15 ENCOUNTER — Other Ambulatory Visit: Payer: Self-pay | Admitting: Family Medicine

## 2020-07-15 ENCOUNTER — Telehealth: Payer: Self-pay

## 2020-07-15 NOTE — Telephone Encounter (Signed)
Pt called his Gout has flared again wanting rx refill on his prednisone

## 2020-07-16 ENCOUNTER — Other Ambulatory Visit: Payer: Self-pay | Admitting: Family Medicine

## 2020-07-16 MED ORDER — PREDNISONE 20 MG PO TABS: ORAL_TABLET | ORAL | 0 refills | Status: DC

## 2020-07-16 NOTE — Telephone Encounter (Signed)
I sent prednisone to his pharmacy

## 2020-07-16 NOTE — Telephone Encounter (Signed)
Pt aware of prescription sent to pharmacy

## 2020-08-24 DIAGNOSIS — N401 Enlarged prostate with lower urinary tract symptoms: Secondary | ICD-10-CM | POA: Diagnosis not present

## 2020-08-24 DIAGNOSIS — N411 Chronic prostatitis: Secondary | ICD-10-CM | POA: Diagnosis not present

## 2020-08-24 DIAGNOSIS — R350 Frequency of micturition: Secondary | ICD-10-CM | POA: Diagnosis not present

## 2020-08-24 DIAGNOSIS — R300 Dysuria: Secondary | ICD-10-CM | POA: Diagnosis not present

## 2020-09-01 ENCOUNTER — Encounter: Payer: Self-pay | Admitting: Family Medicine

## 2020-09-01 DIAGNOSIS — I10 Essential (primary) hypertension: Secondary | ICD-10-CM | POA: Diagnosis not present

## 2020-09-01 DIAGNOSIS — K449 Diaphragmatic hernia without obstruction or gangrene: Secondary | ICD-10-CM | POA: Diagnosis not present

## 2020-09-01 DIAGNOSIS — Z87891 Personal history of nicotine dependence: Secondary | ICD-10-CM | POA: Diagnosis not present

## 2020-09-01 DIAGNOSIS — K219 Gastro-esophageal reflux disease without esophagitis: Secondary | ICD-10-CM | POA: Diagnosis not present

## 2020-09-01 DIAGNOSIS — K22711 Barrett's esophagus with high grade dysplasia: Secondary | ICD-10-CM | POA: Diagnosis not present

## 2020-09-01 DIAGNOSIS — E785 Hyperlipidemia, unspecified: Secondary | ICD-10-CM | POA: Diagnosis not present

## 2020-09-07 DIAGNOSIS — Z20828 Contact with and (suspected) exposure to other viral communicable diseases: Secondary | ICD-10-CM | POA: Diagnosis not present

## 2020-09-16 ENCOUNTER — Ambulatory Visit (INDEPENDENT_AMBULATORY_CARE_PROVIDER_SITE_OTHER): Payer: Medicare Other

## 2020-09-16 ENCOUNTER — Other Ambulatory Visit: Payer: Self-pay

## 2020-09-16 DIAGNOSIS — Z23 Encounter for immunization: Secondary | ICD-10-CM

## 2020-09-25 ENCOUNTER — Encounter (HOSPITAL_COMMUNITY): Payer: Self-pay | Admitting: Neurology

## 2020-09-25 ENCOUNTER — Emergency Department (HOSPITAL_COMMUNITY): Payer: Medicare Other

## 2020-09-25 ENCOUNTER — Observation Stay (HOSPITAL_COMMUNITY)
Admission: EM | Admit: 2020-09-25 | Discharge: 2020-09-26 | Disposition: A | Discharge status: Home / Self Care | Payer: Medicare Other | Attending: Internal Medicine | Admitting: Internal Medicine

## 2020-09-25 DIAGNOSIS — Z20822 Contact with and (suspected) exposure to covid-19: Secondary | ICD-10-CM | POA: Insufficient documentation

## 2020-09-25 DIAGNOSIS — R079 Chest pain, unspecified: Secondary | ICD-10-CM | POA: Diagnosis not present

## 2020-09-25 DIAGNOSIS — Z79899 Other long term (current) drug therapy: Secondary | ICD-10-CM | POA: Insufficient documentation

## 2020-09-25 DIAGNOSIS — F32A Depression, unspecified: Secondary | ICD-10-CM | POA: Diagnosis present

## 2020-09-25 DIAGNOSIS — D7589 Other specified diseases of blood and blood-forming organs: Secondary | ICD-10-CM

## 2020-09-25 DIAGNOSIS — F419 Anxiety disorder, unspecified: Secondary | ICD-10-CM | POA: Diagnosis not present

## 2020-09-25 DIAGNOSIS — F329 Major depressive disorder, single episode, unspecified: Secondary | ICD-10-CM | POA: Insufficient documentation

## 2020-09-25 DIAGNOSIS — R200 Anesthesia of skin: Secondary | ICD-10-CM | POA: Diagnosis not present

## 2020-09-25 DIAGNOSIS — Z87891 Personal history of nicotine dependence: Secondary | ICD-10-CM | POA: Diagnosis not present

## 2020-09-25 DIAGNOSIS — R29818 Other symptoms and signs involving the nervous system: Secondary | ICD-10-CM | POA: Diagnosis not present

## 2020-09-25 DIAGNOSIS — I10 Essential (primary) hypertension: Secondary | ICD-10-CM | POA: Diagnosis present

## 2020-09-25 DIAGNOSIS — G459 Transient cerebral ischemic attack, unspecified: Principal | ICD-10-CM | POA: Diagnosis present

## 2020-09-25 DIAGNOSIS — E872 Acidosis, unspecified: Secondary | ICD-10-CM

## 2020-09-25 DIAGNOSIS — Z7982 Long term (current) use of aspirin: Secondary | ICD-10-CM | POA: Diagnosis not present

## 2020-09-25 DIAGNOSIS — R202 Paresthesia of skin: Secondary | ICD-10-CM | POA: Diagnosis not present

## 2020-09-25 LAB — DIFFERENTIAL
Abs Immature Granulocytes: 0.04 10*3/uL (ref 0.00–0.07)
Basophils Absolute: 0 10*3/uL (ref 0.0–0.1)
Basophils Relative: 1 %
Eosinophils Absolute: 0.1 10*3/uL (ref 0.0–0.5)
Eosinophils Relative: 2 %
Immature Granulocytes: 1 %
Lymphocytes Relative: 23 %
Lymphs Abs: 1.6 10*3/uL (ref 0.7–4.0)
Monocytes Absolute: 0.8 10*3/uL (ref 0.1–1.0)
Monocytes Relative: 11 %
Neutro Abs: 4.3 10*3/uL (ref 1.7–7.7)
Neutrophils Relative %: 62 %

## 2020-09-25 LAB — CBC
HCT: 46 % (ref 39.0–52.0)
Hemoglobin: 15.9 g/dL (ref 13.0–17.0)
MCH: 34.7 pg — ABNORMAL HIGH (ref 26.0–34.0)
MCHC: 34.6 g/dL (ref 30.0–36.0)
MCV: 100.4 fL — ABNORMAL HIGH (ref 80.0–100.0)
Platelets: 236 10*3/uL (ref 150–400)
RBC: 4.58 MIL/uL (ref 4.22–5.81)
RDW: 12.9 % (ref 11.5–15.5)
WBC: 6.9 10*3/uL (ref 4.0–10.5)
nRBC: 0 % (ref 0.0–0.2)

## 2020-09-25 LAB — APTT: aPTT: 28 seconds (ref 24–36)

## 2020-09-25 LAB — URINALYSIS, ROUTINE W REFLEX MICROSCOPIC
Bilirubin Urine: NEGATIVE
Glucose, UA: NEGATIVE mg/dL
Hgb urine dipstick: NEGATIVE
Ketones, ur: NEGATIVE mg/dL
Leukocytes,Ua: NEGATIVE
Nitrite: NEGATIVE
Protein, ur: NEGATIVE mg/dL
Specific Gravity, Urine: 1.002 — ABNORMAL LOW (ref 1.005–1.030)
pH: 6 (ref 5.0–8.0)

## 2020-09-25 LAB — RAPID URINE DRUG SCREEN, HOSP PERFORMED
Amphetamines: NOT DETECTED
Barbiturates: NOT DETECTED
Benzodiazepines: NOT DETECTED
Cocaine: NOT DETECTED
Opiates: NOT DETECTED
Tetrahydrocannabinol: NOT DETECTED

## 2020-09-25 LAB — COMPREHENSIVE METABOLIC PANEL
ALT: 17 U/L (ref 0–44)
AST: 22 U/L (ref 15–41)
Albumin: 3.8 g/dL (ref 3.5–5.0)
Alkaline Phosphatase: 64 U/L (ref 38–126)
Anion gap: 13 (ref 5–15)
BUN: 8 mg/dL (ref 8–23)
CO2: 20 mmol/L — ABNORMAL LOW (ref 22–32)
Calcium: 9 mg/dL (ref 8.9–10.3)
Chloride: 100 mmol/L (ref 98–111)
Creatinine, Ser: 0.83 mg/dL (ref 0.61–1.24)
GFR, Estimated: >60 mL/min (ref >60)
Glucose, Bld: 157 mg/dL — ABNORMAL HIGH (ref 70–99)
Potassium: 3.7 mmol/L (ref 3.5–5.1)
Sodium: 133 mmol/L — ABNORMAL LOW (ref 135–145)
Total Bilirubin: 0.8 mg/dL (ref 0.3–1.2)
Total Protein: 6.7 g/dL (ref 6.5–8.1)

## 2020-09-25 LAB — I-STAT CHEM 8, ED
BUN: 7 mg/dL — ABNORMAL LOW (ref 8–23)
Calcium, Ion: 1.1 mmol/L — ABNORMAL LOW (ref 1.15–1.40)
Chloride: 101 mmol/L (ref 98–111)
Creatinine, Ser: 1.1 mg/dL (ref 0.61–1.24)
Glucose, Bld: 152 mg/dL — ABNORMAL HIGH (ref 70–99)
HCT: 46 % (ref 39.0–52.0)
Hemoglobin: 15.6 g/dL (ref 13.0–17.0)
Potassium: 3.7 mmol/L (ref 3.5–5.1)
Sodium: 134 mmol/L — ABNORMAL LOW (ref 135–145)
TCO2: 20 mmol/L — ABNORMAL LOW (ref 22–32)

## 2020-09-25 LAB — PROTIME-INR
INR: 1 (ref 0.8–1.2)
Prothrombin Time: 12.3 seconds (ref 11.4–15.2)

## 2020-09-25 LAB — ETHANOL: Alcohol, Ethyl (B): 176 mg/dL — ABNORMAL HIGH (ref <10)

## 2020-09-25 IMAGING — CT CT HEAD CODE STROKE
3 series · 15 of 47 positions shown, 18 images · non-contrast
Comparison: None.

CLINICAL DATA: Code stroke.  Neuro deficit

EXAM:
CT HEAD WITHOUT CONTRAST
TECHNIQUE: Contiguous axial images were obtained from the base of the skull
through the vertex without intravenous contrast.

[Series 3: head 5.0 st · axial · 0.46mm/px · z∈[-159,-19]mm · 9 of 34 slices shown, 12 images]
[im 3/34  brain]
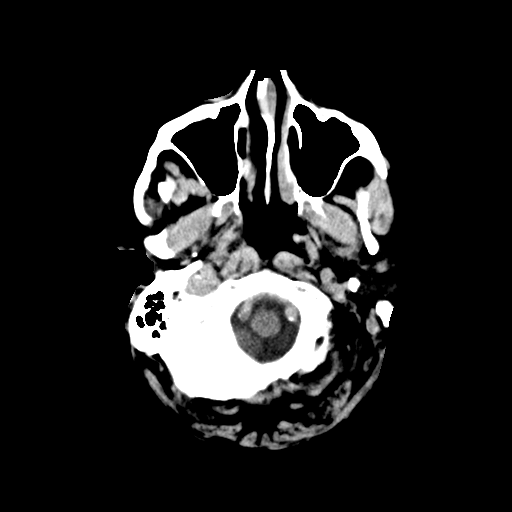
[im 3/34  bone]
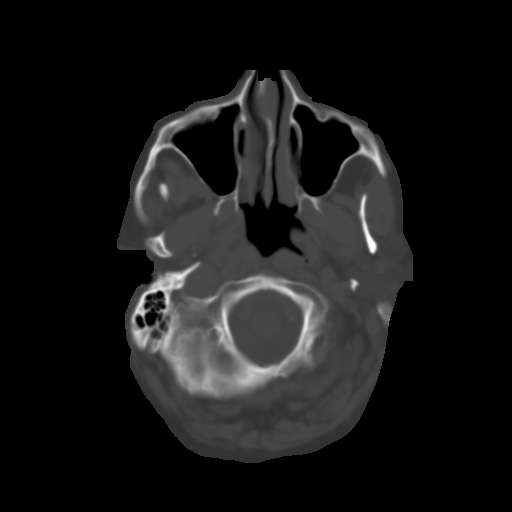
[im 6/34  brain]
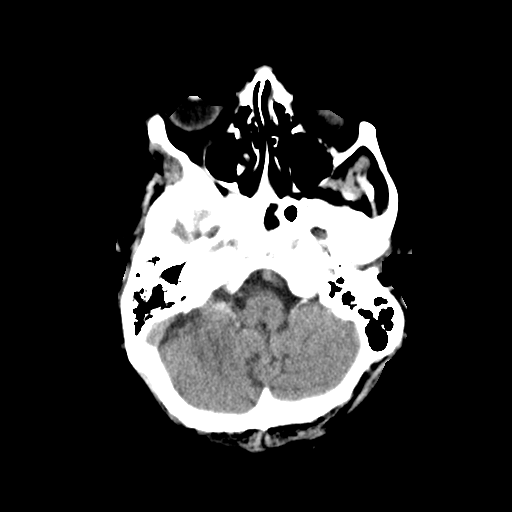
[im 10/34  brain]
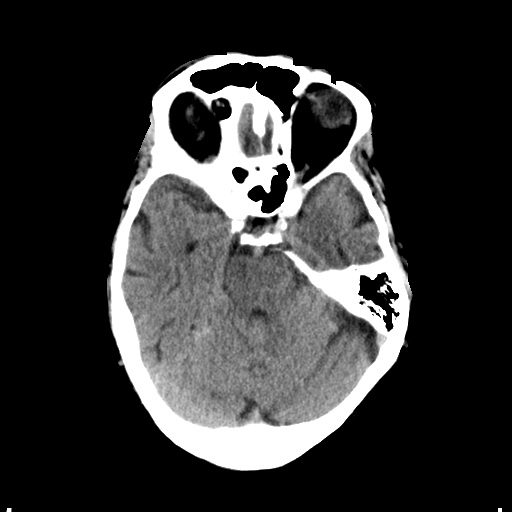
[im 13/34  brain]
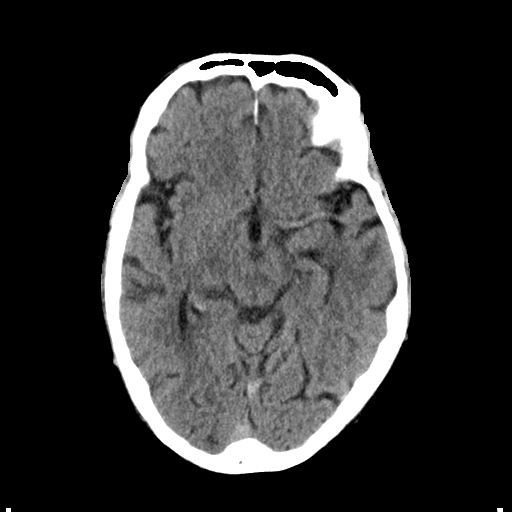
[im 18/34  brain]
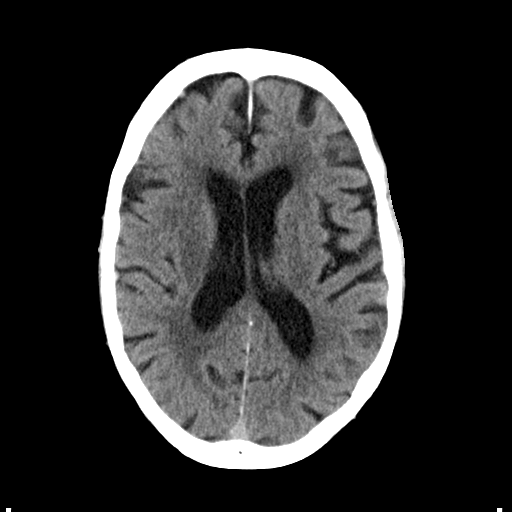
[im 18/34  bone]
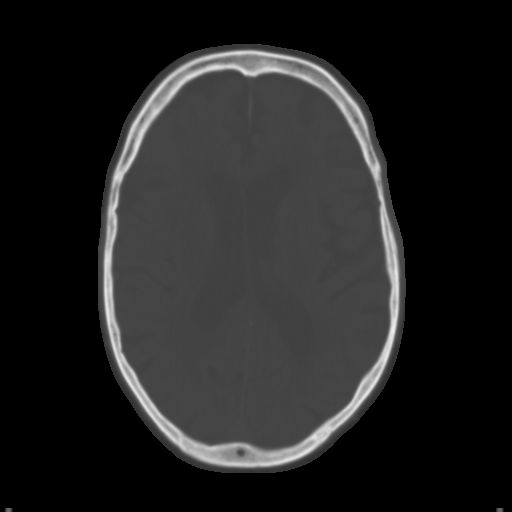
[im 21/34  brain]
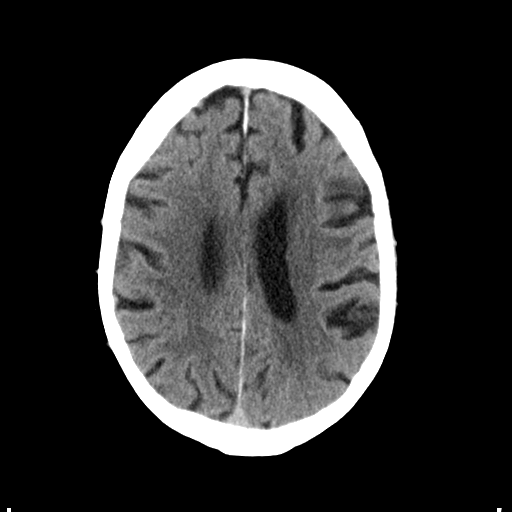
[im 24/34  brain]
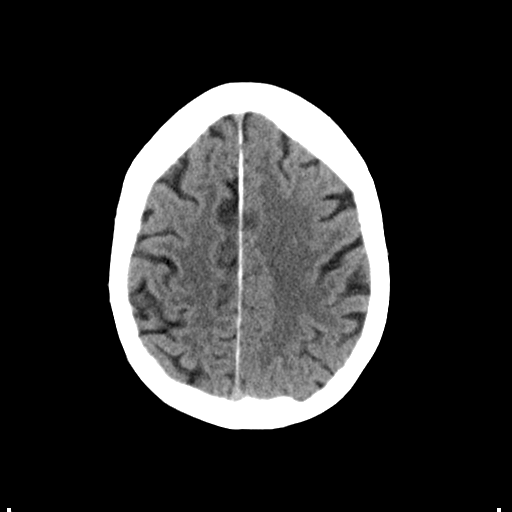
[im 28/34  brain]
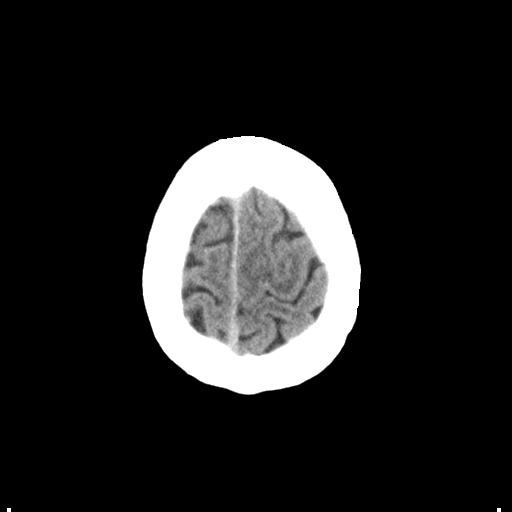
[im 31/34  brain]
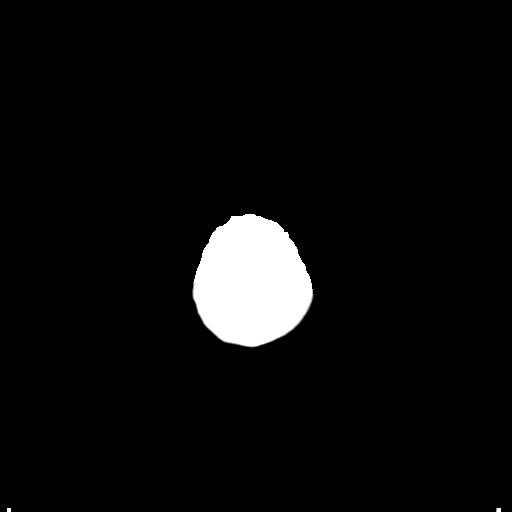
[im 31/34  bone]
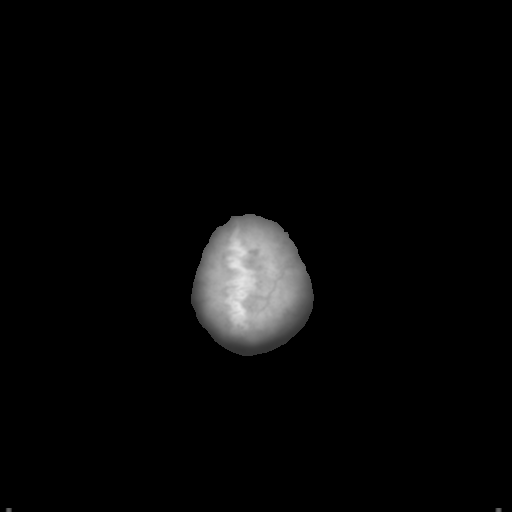

[Series 5: head 3.0 cor st · coronal · 0.32mm/px · 3 of 74 slices shown]
[im 25/74  brain]
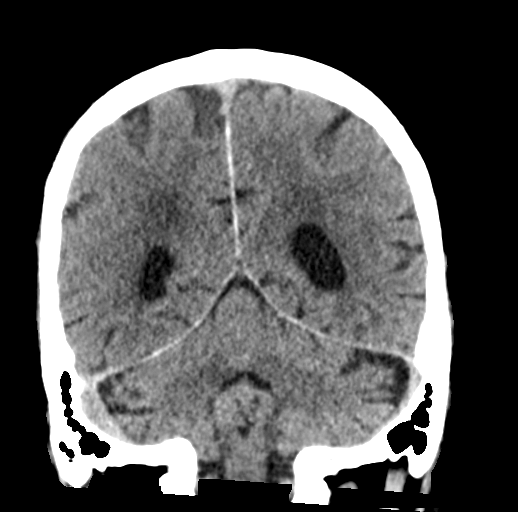
[im 33/74  brain]
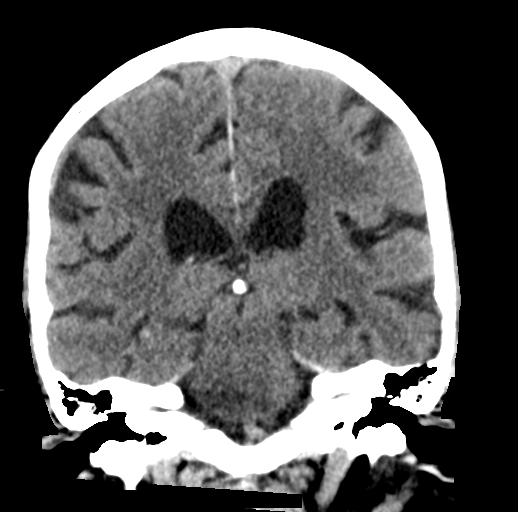
[im 41/74  brain]
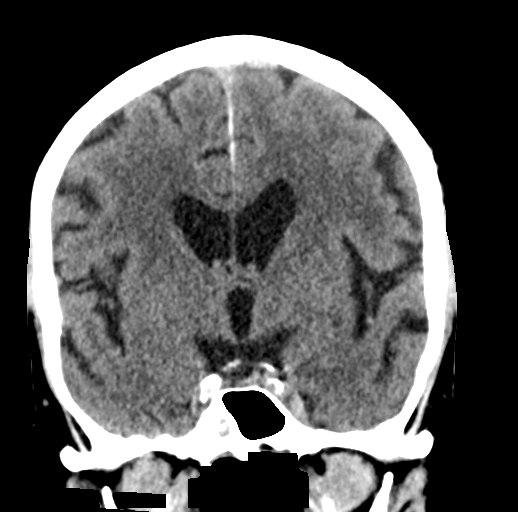

[Series 6: head 3.0 sag st · sagittal · 0.32mm/px · 3 of 59 slices shown]
[im 20/59  brain]
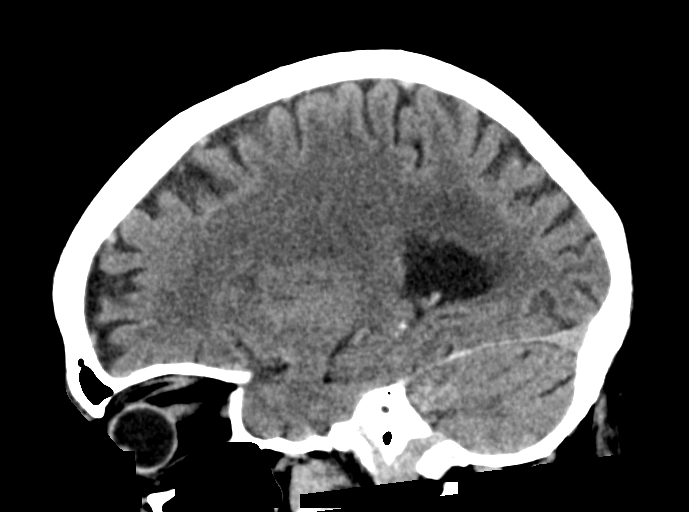
[im 30/59  brain]
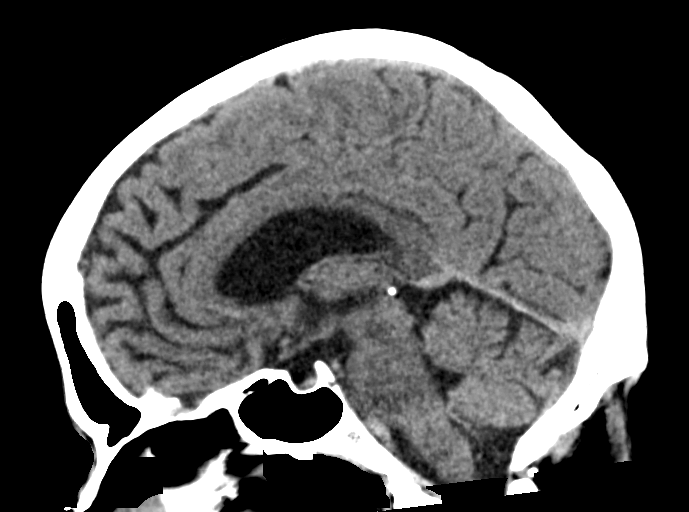
[im 39/59  brain]
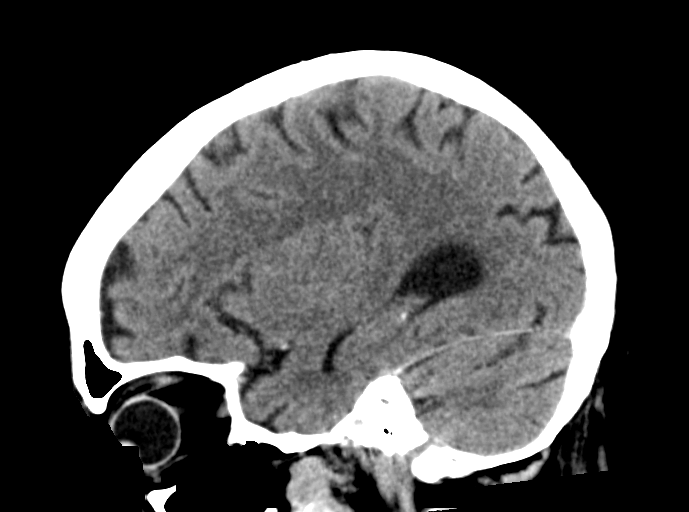

[15 of 47 positions shown; findings below may reference images not displayed]

FINDINGS: Brain: No acute infarct or intracranial hemorrhage. No mass lesion.
No midline shift, ventriculomegaly or extra-axial fluid collection.
Scattered and confluent hypodense foci involving the periventricular
and subcortical white matter nonspecific however commonly associated
with chronic microvascular ischemic changes. Mild cerebral atrophy
with ex vacuo dilatation.

Vascular: No hyperdense vessel or unexpected calcification.
Bilateral skull base atherosclerotic calcifications.

Skull: Negative for fracture or focal lesion.

Sinuses/Orbits: Normal orbits. Sequela of chronic right sphenoid
sinus disease. Minimal left maxillary sinus mucosal thickening.
Pneumatized mastoid air cells.

Other: None.

ASPECTS (Alberta Stroke Program Early CT Score)

- Ganglionic level infarction (caudate, lentiform nuclei, internal
capsule, insula, M1-M3 cortex): 7

- Supraganglionic infarction (M4-M6 cortex): 3

Total score (0-10 with 10 being normal): 10
IMPRESSION: 1. No acute intracranial process.
2. ASPECTS is 10
3. Mild cerebral atrophy and chronic microvascular ischemic changes.
4. Code stroke imaging results were communicated on [DATE] at
[DATE] to provider Dr. GORACA via secure text paging.

## 2020-09-25 MED ORDER — ASPIRIN 81 MG PO CHEW
324.0000 mg | CHEWABLE_TABLET | Freq: Once | ORAL | Status: AC
  Administered 2020-09-25: 324 mg via ORAL

## 2020-09-25 NOTE — Code Documentation (Signed)
Responded to Code Stroke called at 2135 for R sided numbness, [id number]. Pt arrived at 2142, NIH-1 for R leg sensory deficit, CBG-152, CT head negative for acute changes. 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 Pt placed on TIA alert.

## 2020-09-25 NOTE — H&P (Signed)
History and Physical    Walter Cross YYT:[id number] DOB: 1951-11-05 DOA: 09/25/2020  PCP: Susy Frizzle, MD Patient coming from: Home  Chief Complaint: Right-sided numbness  HPI: Walter Cross is a 69 y.o. male with medical history significant of hypertension, hyperlipidemia, GERD, depression, anxiety, BPH presenting to the ED as code stroke with complaints of right-sided numbness.  Patient was at a restaurant and had 2 glasses of wine and developed sudden onset right-sided numbness and tingling.  He noticed that he had difficulty putting his glasses in his pocket.  This happened on 10/29 at around 9 or 9:30 PM.  Patient states his symptoms resolved within 30 to 45 minutes.  He did not have any changes in his vision, slurring of speech, or drooping of his face.  Denies history of prior stroke.  He does not smoke cigarettes.  Reports regular alcohol consumption - drinks 2-3 beers and 1-2 glasses of wine every night.  No other complaints.  Denies chest pain, shortness breath, abdominal pain, nausea, vomiting, or diarrhea.  ED Course: Hemodynamically stable.  WBC 6.9, hemoglobin 15.9, hematocrit 46.0, MCV 100.4, platelet 236k.  Sodium 133, potassium 3.7, chloride 100, bicarb 20, BUN 8, creatinine 0.8, glucose 157.  INR 1.0.  Blood ethanol level elevated at 176.  UDS negative.  UA without signs of infection.  Screening SARS-CoV-2 PCR test pending.  Head CT negative for acute intracranial abnormality  Neurology consulted and recommended admission for TIA work-up.  Patient was given aspirin 324 mg.  Review of Systems:  All systems reviewed and apart from history of presenting illness, are negative.  Past Medical History:  Diagnosis Date  . Adenomatous colon polyp 2015    Buccini (q 5 yrs)   . Barrett's esophagus   . Benign colon polyp   . Depression   . Dermatitis    axillary  . 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 Genital HSV   . GERD (gastroesophageal reflux  disease)   . Hx of cardiovascular stress test    ETT-Myoview 9/13: no ischemia, EF 81%  . Hyperlipidemia   . Hypertension   . Hypertrophic cardiomyopathy ([place])    a. Echo 2007: mod LVH, diast dysfn, mild MR, no LVOT obstruction;  b. echo 9/13: upper septal thickening, no SAM of MV, no LVOT gradient, mild LVH, EF 65%  . Vitamin D deficiency     Past Surgical History:  Procedure Laterality Date  . CERVICAL SPINE SURGERY  [phone]   nerve release, Dr Hal Neer  . SPINE SURGERY     L4-L5, 1992-1993     reports that he quit smoking about 44 years ago. His smoking use included cigarettes. He has a 7.00 pack-year smoking history. He has never used smokeless tobacco. He reports current alcohol use of about 2.0 standard drinks of alcohol per week. He reports that he does not use drugs.  Allergies  Allergen Reactions  . Esomeprazole Magnesium Other (See Comments)    cramps  . Septra [Bactrim] Other (See Comments)    Back ache  . Sulfamethoxazole-Trimethoprim     Other reaction(s): Other (See Comments) Back ache Back ache Back ache     Family History  Problem Relation Age of Onset  . Cancer Mother 77       cervical  . Cancer Father        lung  . Stroke Brother   . Atrial fibrillation Other     Prior to Admission medications   Medication  Sig Start Date End Date Taking? Authorizing Provider  acyclovir ointment (ZOVIRAX) 5 % Apply 1 application topically daily as needed (cold sores & herpes). 03/18/16   Susy Frizzle, MD  aspirin 81 MG tablet Take 81 mg by mouth daily.      [provider]  carvedilol (COREG) 12.5 MG tablet Take 1 tablet (12.5 mg total) by mouth 2 (two) times daily with a meal. 01/01/20   Nahser, [name], MD  Cholecalciferol (VITAMIN D PO) Take 4,000 mg by mouth daily.    [provider]  clonazePAM (KLONOPIN) 0.5 MG tablet TAKE 1 TABLET(0.5 MG) BY MOUTH AT BEDTIME AS NEEDED FOR ANXIETY OR SLEEP 03/09/20   Susy Frizzle, MD  Cyanocobalamin  (VITAMIN B-12) 1000 MCG SUBL Place under the tongue.    [provider]  diltiazem (CARTIA XT) 180 MG 24 hr capsule TAKE 1 CAPSULE(180 MG) BY MOUTH DAILY 01/01/20   Nahser, [name], MD  ibuprofen (ADVIL,MOTRIN) 200 MG tablet Take 200 mg by mouth every 4 (four) hours as needed for headache, mild pain or moderate pain.  08/16/12   Richardson Dopp T, PA-C  nystatin-triamcinolone (MYCOLOG II) cream Apply 1 application topically as needed. 07/11/19   [provider]  pantoprazole (PROTONIX) 40 MG tablet Take 40 mg by mouth daily.    [provider]  predniSONE (DELTASONE) 20 MG tablet TAKE 3 TABLETS BY MOUTH DAILY ON DAYS 1 TO 2, THEN 2 TABLETS DAILY ON DAYS 3 TO 4 AND 1 TABLET DAILY ON DAYS 5 TO 6 03/12/20   Susy Frizzle, MD  predniSONE (DELTASONE) 20 MG tablet 3 tabs poqday 1-2, 2 tabs poqday 3-4, 1 tab poqday 5-6 07/16/20   Susy Frizzle, MD  ramipril (ALTACE) 10 MG capsule TAKE 1 CAPSULE(10 MG) BY MOUTH DAILY 03/31/20   Susy Frizzle, MD  sildenafil (REVATIO) 20 MG tablet 3-5 tablets daily as needed 12/05/19   Nahser, [name], MD  tamsulosin (FLOMAX) 0.4 MG CAPS capsule Take 0.4 mg by mouth at bedtime. 10/23/19   [provider]  venlafaxine XR (EFFEXOR-XR) 150 MG 24 hr capsule TAKE 1 CAPSULE(150 MG) BY MOUTH DAILY WITH BREAKFAST 03/31/20   Susy Frizzle, MD    Physical Exam: Vitals:   09/25/20 2300 09/25/20 2315 09/25/20 2330 09/25/20 2345  BP: 117/71 124/76 (!) 118/59 120/61  Pulse: 69 67 67 68  Resp: 16 16 14 19   Temp:      TempSrc:      SpO2: 94% 94% 97% 98%  Weight:      Height:        Physical Exam Constitutional:      General: He is not in acute distress. HENT:     Head: Normocephalic and atraumatic.  Eyes:     Extraocular Movements: Extraocular movements intact.     Conjunctiva/sclera: Conjunctivae normal.  Cardiovascular:     Rate and Rhythm: Normal rate and regular rhythm.     Pulses: Normal pulses.  Pulmonary:     Effort:  Pulmonary effort is normal. No respiratory distress.     Breath sounds: Normal breath sounds. No wheezing or rales.  Abdominal:     General: Bowel sounds are normal. There is no distension.     Palpations: Abdomen is soft.     Tenderness: There is no abdominal tenderness.  Musculoskeletal:        General: No swelling or tenderness.     Cervical back: Normal range of motion and neck supple.  Skin:    General: Skin is warm and dry.  Neurological:     Mental Status: He is alert and oriented to person, place, and time.     Cranial Nerves: No cranial nerve deficit.     Comments: Speech fluent, tongue midline, no facial droop Strength 5 out of 5 in bilateral upper and lower extremities. Sensation to light touch intact throughout.     Labs on Admission: I have personally reviewed following labs and imaging studies  CBC: Recent Labs  Lab 09/25/20 2149 09/25/20 2154  WBC 6.9  --   NEUTROABS 4.3  --   HGB 15.9 15.6  HCT 46.0 46.0  MCV 100.4*  --   PLT 236  --    Basic Metabolic Panel: Recent Labs  Lab 09/25/20 2149 09/25/20 2154  NA 133* 134*  K 3.7 3.7  CL 100 101  CO2 20*  --   GLUCOSE 157* 152*  BUN 8 7*  CREATININE 0.83 1.10  CALCIUM 9.0  --    GFR: Estimated Creatinine Clearance: 62.5 mL/min (by C-G formula based on SCr of 1.1 mg/dL). Liver Function Tests: Recent Labs  Lab 09/25/20 2149  AST 22  ALT 17  ALKPHOS 64  BILITOT 0.8  PROT 6.7  ALBUMIN 3.8   No results for input(s): LIPASE, AMYLASE in the last 168 hours. No results for input(s): AMMONIA in the last 168 hours. Coagulation Profile: Recent Labs  Lab 09/25/20 2149  INR 1.0   Cardiac Enzymes: No results for input(s): CKTOTAL, CKMB, CKMBINDEX, TROPONINI in the last 168 hours. BNP (last 3 results) No results for input(s): PROBNP in the last 8760 hours. HbA1C: No results for input(s): HGBA1C in the last 72 hours. CBG: No results for input(s): GLUCAP in the last 168 hours. Lipid Profile: No  results for input(s): CHOL, HDL, LDLCALC, TRIG, CHOLHDL, LDLDIRECT in the last 72 hours. Thyroid Function Tests: No results for input(s): TSH, T4TOTAL, FREET4, T3FREE, THYROIDAB in the last 72 hours. Anemia Panel: No results for input(s): VITAMINB12, FOLATE, FERRITIN, TIBC, IRON, RETICCTPCT in the last 72 hours. Urine analysis:    Component Value Date/Time   COLORURINE STRAW (A) 09/25/2020 2235   APPEARANCEUR CLEAR 09/25/2020 2235   LABSPEC 1.002 (L) 09/25/2020 2235   PHURINE 6.0 09/25/2020 2235   GLUCOSEU NEGATIVE 09/25/2020 2235   HGBUR NEGATIVE 09/25/2020 [place] 09/25/2020 2235   KETONESUR NEGATIVE 09/25/2020 2235   PROTEINUR NEGATIVE 09/25/2020 2235   NITRITE NEGATIVE 09/25/2020 [place] 09/25/2020 2235    Radiological Exams on Admission: CT HEAD CODE STROKE WO CONTRAST  Result Date: 09/25/2020 CLINICAL DATA:  Code stroke.  Neuro deficit EXAM: CT HEAD WITHOUT CONTRAST TECHNIQUE: Contiguous axial images were obtained from the base of the skull through the vertex without intravenous contrast. COMPARISON:  None. FINDINGS: Brain: No acute infarct or intracranial hemorrhage. No mass lesion. No midline shift, ventriculomegaly or extra-axial fluid collection. Scattered and confluent hypodense foci involving the periventricular and subcortical white matter nonspecific however commonly associated with chronic microvascular ischemic changes. Mild cerebral atrophy with ex vacuo dilatation. Vascular: No hyperdense vessel or unexpected calcification. Bilateral skull base atherosclerotic calcifications. Skull: Negative for fracture or focal lesion. Sinuses/Orbits: Normal orbits. Sequela of chronic right sphenoid sinus disease. Minimal left maxillary sinus mucosal thickening. Pneumatized mastoid air cells. Other: None. ASPECTS [facility] Stroke Program Early CT Score) - Ganglionic level infarction (caudate, lentiform nuclei, internal capsule, insula, M1-M3  cortex): 7 - Supraganglionic infarction (M4-M6 cortex): 3 Total  score (0-10 with 10 being normal): 10 IMPRESSION: 1. No acute intracranial process. 2. ASPECTS is 10 3. Mild cerebral atrophy and chronic microvascular ischemic changes. 4. Code stroke imaging results were communicated on 09/25/2020 at 9:59 pm to provider Dr. Ernesta Amble via secure text paging. Electronically Signed   By: Primitivo Gauze M.D.   On: 09/25/2020 22:00    EKG: Independently reviewed.  Sinus rhythm, no significant change since prior tracing.  Assessment/Plan Principal Problem:   TIA (transient ischemic attack) Active Problems:   Anxiety and depression   HTN (hypertension)   Macrocytosis without anemia   Metabolic acidosis   TIA Patient presented with complaints of right-sided sensory deficits and right arm incoordination.  Deficits have now resolved.  Head CT negative for acute intracranial abnormality.   -Neurology consulted, appreciate recommendations -Telemetry monitoring -Allow for permissive hypertension up to 220/110 at this time -MRI of the brain without contrast -MRA head and neck -2D echocardiogram -Hemoglobin A1c, fasting lipid panel -Start statin if LDL >70 -Antiplatelet therapy as recommended by neurology: Aspirin 324 mg now, then 81 mg daily.  Plavix 75 mg daily x3 weeks. -Frequent neurochecks -PT, OT, speech therapy. -N.p.o. until cleared by bedside swallow evaluation or formal speech evaluation  Macrocytosis without anemia Likely related to daily alcohol consumption.  Hemoglobin 15.9, hematocrit 46.0, MCV 100.4. -Check B12 and folate levels  Mild normal anion gap metabolic acidosis Bicarb 20, anion gap 13.  Possibly related to alcohol use prior to arrival. -Repeat BMP in a.m.  Hypertension -Allow for permissive hypertension at this time  Hyperlipidemia Patient is not on a statin. -Check lipid panel and start statin if LDL >70  GERD -Continue Protonix  Depression, anxiety -Continue  Effexor  BPH -Continue Flomax  Alcohol use disorder Patient reports drinking 2-3 beers and 1-2 glasses of wine every night.  He had 2 glasses of wine at a restaurant prior to ED arrival.  Blood ethanol level elevated. -Possible risk of withdrawal during this hospitalization.  CIWA protocol; Ativan as needed.  Thiamine, folate, and multivitamin.  DVT prophylaxis: Lovenox Code Status: Patient wishes to be full code. Family Communication: No family available at this time. Disposition Plan: Status is: Observation  The patient remains OBS appropriate and will d/c before 2 midnights.  Dispo: The patient is from: Home              Anticipated d/c is to: Home              Anticipated d/c date is: 2 days              Patient currently is not medically stable to d/c.  The medical decision making on this patient was of high complexity and the patient is at high risk for clinical deterioration, therefore this is a level 3 visit.  Shela Leff MD [facility]  If 7PM-7AM, please contact night-coverage [email or web address]  09/26/2020, 12:30 AM

## 2020-09-25 NOTE — ED Provider Notes (Signed)
[facility] EMERGENCY DEPARTMENT Provider Note   CSN: [id number] Arrival date & time: 09/25/20  2142  An emergency department physician performed an initial assessment on this suspected stroke patient at 2142.  History No chief complaint on file.   Walter Cross is a 69 y.o. male.  HPI Patient presents as a code stroke.  While he was out eating and while he was eating began to feel some numbness or tingling in his right cheek and face.  Reportedly then moved onto his right arm and right leg.  States there was no weakness just sensation change.  However states he had difficulty putting his glasses into his pocket with his right hand.  Has not episodes like this before.  No headache.  Began around 1/2-hour prior to arrival.  CBG 130.  Blood pressure 118/84 for EMS.States symptoms are much improved now.    Past Medical History:  Diagnosis Date  . Adenomatous colon polyp 2015    Buccini (q 5 yrs)   . Barrett's esophagus   . Benign colon polyp   . Depression   . Dermatitis    axillary  . 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 Genital HSV   . GERD (gastroesophageal reflux disease)   . Hx of cardiovascular stress test    ETT-Myoview 9/13: no ischemia, EF 81%  . Hyperlipidemia   . Hypertension   . Hypertrophic cardiomyopathy ([place])    a. Echo 2007: mod LVH, diast dysfn, mild MR, no LVOT obstruction;  b. echo 9/13: upper septal thickening, no SAM of MV, no LVOT gradient, mild LVH, EF 65%  . Vitamin D deficiency     Patient Active Problem List   Diagnosis Date Noted  . HTN (hypertension) 10/21/2013  . Insomnia 07/04/2013  . Chest heaviness 09/23/2012  . Chest pain 08/02/2012  . Anxiety and depression 08/02/2012  . Left ventricular hypertrophy 08/25/2011    Past Surgical History:  Procedure Laterality Date  . CERVICAL SPINE SURGERY  [phone]   nerve release, Dr Hal Neer  . SPINE SURGERY     L4-L5, [phone]       Family History  Problem  Relation Age of Onset  . Cancer Mother 20       cervical  . Cancer Father        lung  . Stroke Brother   . Atrial fibrillation Other     Social History   Tobacco Use  . Smoking status: Former Smoker    Packs/day: 1.00    Years: 7.00    Pack years: 7.00    Types: Cigarettes    Quit date: 11/29/1975    Years since quitting: 44.8  . Smokeless tobacco: Never Used  Vaping Use  . Vaping Use: Never used  Substance Use Topics  . Alcohol use: Yes    Alcohol/week: 2.0 standard drinks    Types: 2 Standard drinks or equivalent per week  . Drug use: No    Home Medications Prior to Admission medications   Medication Sig Start Date End Date Taking? Authorizing Provider  acyclovir ointment (ZOVIRAX) 5 % Apply 1 application topically daily as needed (cold sores & herpes). 03/18/16   Susy Frizzle, MD  aspirin 81 MG tablet Take 81 mg by mouth daily.      [provider]  carvedilol (COREG) 12.5 MG tablet Take 1 tablet (12.5 mg total) by mouth 2 (two) times daily with a meal. 01/01/20   Nahser, [name], MD  Cholecalciferol (VITAMIN D PO) Take 4,000 mg by mouth daily.    [provider]  clonazePAM (KLONOPIN) 0.5 MG tablet TAKE 1 TABLET(0.5 MG) BY MOUTH AT BEDTIME AS NEEDED FOR ANXIETY OR SLEEP 03/09/20   Susy Frizzle, MD  Cyanocobalamin (VITAMIN B-12) 1000 MCG SUBL Place under the tongue.    [provider]  diltiazem (CARTIA XT) 180 MG 24 hr capsule TAKE 1 CAPSULE(180 MG) BY MOUTH DAILY 01/01/20   Nahser, [name], MD  ibuprofen (ADVIL,MOTRIN) 200 MG tablet Take 200 mg by mouth every 4 (four) hours as needed for headache, mild pain or moderate pain.  08/16/12   Richardson Dopp T, PA-C  nystatin-triamcinolone (MYCOLOG II) cream Apply 1 application topically as needed. 07/11/19   [provider]  pantoprazole (PROTONIX) 40 MG tablet Take 40 mg by mouth daily.    [provider]  predniSONE (DELTASONE) 20 MG tablet TAKE 3 TABLETS BY MOUTH DAILY ON DAYS  1 TO 2, THEN 2 TABLETS DAILY ON DAYS 3 TO 4 AND 1 TABLET DAILY ON DAYS 5 TO 6 03/12/20   Susy Frizzle, MD  predniSONE (DELTASONE) 20 MG tablet 3 tabs poqday 1-2, 2 tabs poqday 3-4, 1 tab poqday 5-6 07/16/20   Susy Frizzle, MD  ramipril (ALTACE) 10 MG capsule TAKE 1 CAPSULE(10 MG) BY MOUTH DAILY 03/31/20   Susy Frizzle, MD  sildenafil (REVATIO) 20 MG tablet 3-5 tablets daily as needed 12/05/19   Nahser, [name], MD  tamsulosin (FLOMAX) 0.4 MG CAPS capsule Take 0.4 mg by mouth at bedtime. 10/23/19   [provider]  venlafaxine XR (EFFEXOR-XR) 150 MG 24 hr capsule TAKE 1 CAPSULE(150 MG) BY MOUTH DAILY WITH BREAKFAST 03/31/20   Susy Frizzle, MD    Allergies    Esomeprazole magnesium and Septra [bactrim]  Review of Systems   Review of Systems  Constitutional: Negative for appetite change.  HENT: Negative for congestion.   Respiratory: Negative for shortness of breath.   Cardiovascular: Negative for chest pain.  Gastrointestinal: Negative for abdominal distention.  Genitourinary: Negative for flank pain.  Musculoskeletal: Negative for back pain.  Skin: Negative for rash.  Neurological: Positive for numbness. Negative for headaches.  Psychiatric/Behavioral: Negative for confusion.    Physical Exam Updated Vital Signs BP 122/70 (BP Location: Left Arm)   Pulse 67   Temp (!) 96.7 F (35.9 C) (Temporal)   Resp 11   Ht 5' 9"  (1.753 m)   Wt 82.9 kg   SpO2 95%   BMI 26.99 kg/m   Physical Exam Vitals and nursing note reviewed.  HENT:     Head: Normocephalic.     Right Ear: Tympanic membrane normal.  Eyes:     Extraocular Movements: Extraocular movements intact.     Pupils: Pupils are equal, round, and reactive to light.  Cardiovascular:     Rate and Rhythm: Normal rate.     Heart sounds: Normal heart sounds.  Pulmonary:     Breath sounds: Normal breath sounds.  Abdominal:     Tenderness: There is no abdominal tenderness.  Musculoskeletal:        General:  No tenderness.     Cervical back: Neck supple.  Skin:    General: Skin is warm.     Capillary Refill: Capillary refill takes less than 2 seconds.  Neurological:     Mental Status: He is alert and oriented to person, place, and time.     Comments: Face symmetric.  Eye movements  intact.  Pupils reactive.  Good grip strength bilaterally.  Good upper and lower extremity strength.  Sensation appears symmetric to gross light.  Complete NIH scoring done by neurology.     ED Results / Procedures / Treatments   Labs (all labs ordered are listed, but only abnormal results are displayed) Labs Reviewed  ETHANOL - Abnormal; Notable for the following components:      Result Value   Alcohol, Ethyl (B) 176 (*)    All other components within normal limits  CBC - Abnormal; Notable for the following components:   MCV 100.4 (*)    MCH 34.7 (*)    All other components within normal limits  COMPREHENSIVE METABOLIC PANEL - Abnormal; Notable for the following components:   Sodium 133 (*)    CO2 20 (*)    Glucose, Bld 157 (*)    All other components within normal limits  URINALYSIS, ROUTINE W REFLEX MICROSCOPIC - Abnormal; Notable for the following components:   Color, Urine STRAW (*)    Specific Gravity, Urine 1.002 (*)    All other components within normal limits  I-STAT CHEM 8, ED - Abnormal; Notable for the following components:   Sodium 134 (*)    BUN 7 (*)    Glucose, Bld 152 (*)    Calcium, Ion 1.10 (*)    TCO2 20 (*)    All other components within normal limits  RESPIRATORY PANEL BY RT PCR (FLU A&B, COVID)  PROTIME-INR  APTT  DIFFERENTIAL  RAPID URINE DRUG SCREEN, HOSP PERFORMED    EKG EKG Interpretation  Date/Time:  Friday September 25 2020 22:05:17 EDT Ventricular Rate:  68 PR Interval:    QRS Duration: 102 QT Interval:  402 QTC Calculation: 428 R Axis:   89 Text Interpretation: Sinus rhythm Borderline right axis deviation Abnormal inferior Q waves Baseline wander in lead(s) V3  Confirmed by Davonna Belling [phone]) on 09/25/2020 11:10:34 PM   Radiology CT HEAD CODE STROKE WO CONTRAST  Result Date: 09/25/2020 CLINICAL DATA:  Code stroke.  Neuro deficit EXAM: CT HEAD WITHOUT CONTRAST TECHNIQUE: Contiguous axial images were obtained from the base of the skull through the vertex without intravenous contrast. COMPARISON:  None. FINDINGS: Brain: No acute infarct or intracranial hemorrhage. No mass lesion. No midline shift, ventriculomegaly or extra-axial fluid collection. Scattered and confluent hypodense foci involving the periventricular and subcortical white matter nonspecific however commonly associated with chronic microvascular ischemic changes. Mild cerebral atrophy with ex vacuo dilatation. Vascular: No hyperdense vessel or unexpected calcification. Bilateral skull base atherosclerotic calcifications. Skull: Negative for fracture or focal lesion. Sinuses/Orbits: Normal orbits. Sequela of chronic right sphenoid sinus disease. Minimal left maxillary sinus mucosal thickening. Pneumatized mastoid air cells. Other: None. ASPECTS [facility] Stroke Program Early CT Score) - Ganglionic level infarction (caudate, lentiform nuclei, internal capsule, insula, M1-M3 cortex): 7 - Supraganglionic infarction (M4-M6 cortex): 3 Total score (0-10 with 10 being normal): 10 IMPRESSION: 1. No acute intracranial process. 2. ASPECTS is 10 3. Mild cerebral atrophy and chronic microvascular ischemic changes. 4. Code stroke imaging results were communicated on 09/25/2020 at 9:59 pm to provider Dr. Ernesta Amble via secure text paging. Electronically Signed   By: Primitivo Gauze M.D.   On: 09/25/2020 22:00    Procedures Procedures (including critical care time)  Medications Ordered in ED Medications  aspirin chewable tablet 324 mg (324 mg Oral Given 09/25/20 2218)    ED Course  I have reviewed the triage vital signs and the nursing notes.  Pertinent labs &  imaging results that were available during  my care of the patient were reviewed by me and considered in my medical decision making (see chart for details).    MDM Rules/Calculators/A&P                          Patient presented with acute onset of numbness and some difficulty using his right arm.  Numbness was on his face arm and leg.  Symptoms improved by arrival time however.  Had come in as a code stroke.  Met by myself and neurology at the bridge.  Head CT reassuring.  Discussed with neurology and will need a TIA work-up.  Admit to internal medicine.  I have reviewed imaging and lab work and EKG and interpreted him.  Differential diagnosis includes TIA, stroke, paresthesias.   CRITICAL CARE Performed by: Davonna Belling Total critical care time: 30 minutes Critical care time was exclusive of separately billable procedures and treating other patients. Critical care was necessary to treat or prevent imminent or life-threatening deterioration. Critical care was time spent personally by me on the following activities: development of treatment plan with patient and/or surrogate as well as nursing, discussions with consultants, evaluation of patient's response to treatment, examination of patient, obtaining history from patient or surrogate, ordering and performing treatments and interventions, ordering and review of laboratory studies, ordering and review of radiographic studies, pulse oximetry and re-evaluation of patient's condition.  Final Clinical Impression(s) / ED Diagnoses Final diagnoses:  Numbness    Rx / DC Orders ED Discharge Orders    None       Davonna Belling, MD 09/25/20 2311

## 2020-09-25 NOTE — Consult Note (Addendum)
Neurology Consult H&P  CC: right sided numbness and weakness.  History is obtained from: patient  HPI: Walter Cross is a 69 y.o. male PMHx as reviewed below was at a restaurant and had 2 glasses of wine and developed sudden right sided numbness and noticed he had difficulty putting his glasses in his pocket. Symptoms resolved when EMS arrived but was brought in with concern for TIA. He has never felt this before.  No dizziness, headache, vision/hearing changes, N/V.  Has had previous L3-4 surgery.   LKW: 2050 tpa given?: No, not a candidate IR Thrombectomy? No Modified Rankin Scale: 0-Completely asymptomatic and back to baseline post- stroke NIHSS: 1 right leg sensory L3-4  ROS: A complete ROS was performed and is negative except as noted in the HPI.   Past Medical History:  Diagnosis Date  . Adenomatous colon polyp 2015    Buccini (q 5 yrs)   . Barrett's esophagus   . Benign colon polyp   . Depression   . Dermatitis    axillary  . 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 Genital HSV   . GERD (gastroesophageal reflux disease)   . Hx of cardiovascular stress test    ETT-Myoview 9/13: no ischemia, EF 81%  . Hyperlipidemia   . Hypertension   . Hypertrophic cardiomyopathy ([place])    a. Echo 2007: mod LVH, diast dysfn, mild MR, no LVOT obstruction;  b. echo 9/13: upper septal thickening, no SAM of MV, no LVOT gradient, mild LVH, EF 65%  . Vitamin D deficiency     Family History  Problem Relation Age of Onset  . Cancer Mother 20       cervical  . Cancer Father        lung  . Stroke Brother   . Atrial fibrillation Other    Social History:  reports that he quit smoking about 44 years ago. His smoking use included cigarettes. He has a 7.00 pack-year smoking history. He has never used smokeless tobacco. He reports current alcohol use of about 2.0 standard drinks of alcohol per week. He reports that he does not use drugs.  Prior to Admission medications   Medication  Sig Start Date End Date Taking? Authorizing Provider  acyclovir ointment (ZOVIRAX) 5 % Apply 1 application topically daily as needed (cold sores & herpes). 03/18/16   Susy Frizzle, MD  aspirin 81 MG tablet Take 81 mg by mouth daily.      [provider]  carvedilol (COREG) 12.5 MG tablet Take 1 tablet (12.5 mg total) by mouth 2 (two) times daily with a meal. 01/01/20   Nahser, [name], MD  Cholecalciferol (VITAMIN D PO) Take 4,000 mg by mouth daily.    [provider]  clonazePAM (KLONOPIN) 0.5 MG tablet TAKE 1 TABLET(0.5 MG) BY MOUTH AT BEDTIME AS NEEDED FOR ANXIETY OR SLEEP 03/09/20   Susy Frizzle, MD  Cyanocobalamin (VITAMIN B-12) 1000 MCG SUBL Place under the tongue.    [provider]  diltiazem (CARTIA XT) 180 MG 24 hr capsule TAKE 1 CAPSULE(180 MG) BY MOUTH DAILY 01/01/20   Nahser, [name], MD  ibuprofen (ADVIL,MOTRIN) 200 MG tablet Take 200 mg by mouth every 4 (four) hours as needed for headache, mild pain or moderate pain.  08/16/12   Richardson Dopp T, PA-C  nystatin-triamcinolone (MYCOLOG II) cream Apply 1 application topically as needed. 07/11/19   [provider]  pantoprazole (PROTONIX) 40 MG tablet Take 40 mg  by mouth daily.    [provider]  predniSONE (DELTASONE) 20 MG tablet TAKE 3 TABLETS BY MOUTH DAILY ON DAYS 1 TO 2, THEN 2 TABLETS DAILY ON DAYS 3 TO 4 AND 1 TABLET DAILY ON DAYS 5 TO 6 03/12/20   Susy Frizzle, MD  predniSONE (DELTASONE) 20 MG tablet 3 tabs poqday 1-2, 2 tabs poqday 3-4, 1 tab poqday 5-6 07/16/20   Susy Frizzle, MD  ramipril (ALTACE) 10 MG capsule TAKE 1 CAPSULE(10 MG) BY MOUTH DAILY 03/31/20   Susy Frizzle, MD  sildenafil (REVATIO) 20 MG tablet 3-5 tablets daily as needed 12/05/19   Nahser, [name], MD  tamsulosin (FLOMAX) 0.4 MG CAPS capsule Take 0.4 mg by mouth at bedtime. 10/23/19   [provider]  venlafaxine XR (EFFEXOR-XR) 150 MG 24 hr capsule TAKE 1 CAPSULE(150 MG) BY MOUTH DAILY WITH  BREAKFAST 03/31/20   Susy Frizzle, MD   Exam: Current vital signs: BP 122/70 (BP Location: Left Arm)   Pulse 67   Temp (!) 96.7 F (35.9 C) (Temporal)   Resp 11   Ht 5\' 9"  (1.753 m)   Wt 82.9 kg   SpO2 95%   BMI 26.99 kg/m   Physical Exam  Constitutional: Appears well-developed and well-nourished.  Psych: Affect appropriate to situation Eyes: No scleral injection HENT: No OP obstrucion Head: Normocephalic.  Cardiovascular: Normal rate and regular rhythm.  Respiratory: Effort normal and breath sounds normal to anterior ascultation GI: Soft.  No distension. There is no tenderness.  Skin: WDI  Neuro: Mental Status: Patient is awake, alert, oriented to person, place, month, year, and situation. Patient is able to give a clear and coherent history. No signs of aphasia or neglect Cranial Nerves: II: Visual Fields are full. Pupils are equal, round, and reactive to light.  III,IV, VI: EOMI without ptosis or diploplia.  V: Facial sensation is symmetric to temperature VII: Facial movement is symmetric.  VIII: hearing is intact to voice X: Uvula elevates symmetrically XI: Shoulder shrug is symmetric. XII: tongue is midline without atrophy or fasciculations.  Motor: Tone is normal. Bulk is normal. 5/5 strength was present in all four extremities.  Sensory: Sensation is symmetric to light touch and temperature in the arms and legs.  Deep Tendon Reflexes: 2+ and symmetric in the biceps and patellae.  Plantars: Toes are downgoing bilaterally.  Cerebellar: FNF and HKS are intact bilaterally.   I have reviewed the images obtained: NCT head showed no acute ischemic changes aspects 10.  Assessment: Walter Cross is a 69 y.o. right handed male with right sided sensory deficits and right arm incoordination no completely resolved. The patient has vascular risk factors and will need TIA evaluation.  Give aspirin 324mg  now.  Impression:  TIA HTN LVH  Plan: - MRI brain  without contrast. - Recommend vascular imaging with MRA head and neck. - Recommend TTE. - Recommend labs: HbA1c, lipid panel. - Recommend Statin if LDL > 70 - Aspirin 81mg  daily. - Clopidogrel 75mg  daily for 3 weeks. - Permissive hypertension first 24 h < 220/110 for now and gradually reduce. - Telemetry monitoring for arrhythmia. - Recommend bedside Swallow screen. - Recommend Stroke education. - Recommend PT/OT/SLP consult. - Discussed TIA stroke risk, symptoms of stroke and secondary prevention with patient.  Electronically signed by: Dr. Lynnae Sandhoff Pager: [phone] 09/25/2020, 10:50 PM

## 2020-09-25 NOTE — ED Triage Notes (Signed)
Pt arrives via EMS after eating dinner at a restaurant and feeling R-sided paresthesia along his R arm and leg. Pt reports no loss of R sided motor function since the start of paresthesia. EMS reports intact visual fields and no slurred speech. Pt denies any vision changes.   Pt reports taking baby aspirin daily; does not recall whether or not it was taken this evening.   EMS BP 118/64 CBG 130

## 2020-09-26 ENCOUNTER — Observation Stay (HOSPITAL_COMMUNITY): Payer: Medicare Other

## 2020-09-26 ENCOUNTER — Observation Stay (HOSPITAL_BASED_OUTPATIENT_CLINIC_OR_DEPARTMENT_OTHER): Payer: Medicare Other

## 2020-09-26 ENCOUNTER — Other Ambulatory Visit: Payer: Self-pay

## 2020-09-26 ENCOUNTER — Other Ambulatory Visit (HOSPITAL_COMMUNITY): Payer: Medicare Other

## 2020-09-26 DIAGNOSIS — E7800 Pure hypercholesterolemia, unspecified: Secondary | ICD-10-CM

## 2020-09-26 DIAGNOSIS — I6621 Occlusion and stenosis of right posterior cerebral artery: Secondary | ICD-10-CM | POA: Diagnosis not present

## 2020-09-26 DIAGNOSIS — G459 Transient cerebral ischemic attack, unspecified: Secondary | ICD-10-CM | POA: Diagnosis not present

## 2020-09-26 DIAGNOSIS — E872 Acidosis, unspecified: Secondary | ICD-10-CM

## 2020-09-26 DIAGNOSIS — D7589 Other specified diseases of blood and blood-forming organs: Secondary | ICD-10-CM

## 2020-09-26 DIAGNOSIS — I10 Essential (primary) hypertension: Secondary | ICD-10-CM | POA: Diagnosis not present

## 2020-09-26 LAB — LIPID PANEL
Cholesterol: 177 mg/dL (ref 0–200)
HDL: 65 mg/dL (ref >40)
LDL Cholesterol: 89 mg/dL (ref 0–99)
Total CHOL/HDL Ratio: 2.7 RATIO
Triglycerides: 114 mg/dL (ref <150)
VLDL: 23 mg/dL (ref 0–40)

## 2020-09-26 LAB — HEMOGLOBIN A1C
Hgb A1c MFr Bld: 5.5 % (ref 4.8–5.6)
Mean Plasma Glucose: 111.15 mg/dL

## 2020-09-26 LAB — ECHOCARDIOGRAM COMPLETE
AV Mean grad: 4 mmHg
AV Peak grad: 8 mmHg
Ao pk vel: 1.41 m/s
Area-P 1/2: 4.06 cm2
Height: 69 in
S' Lateral: 1.7 cm
Weight: 1254.4 oz

## 2020-09-26 LAB — HIV ANTIBODY (ROUTINE TESTING W REFLEX): HIV Screen 4th Generation wRfx: NONREACTIVE

## 2020-09-26 LAB — BASIC METABOLIC PANEL
Anion gap: 11 (ref 5–15)
BUN: 8 mg/dL (ref 8–23)
CO2: 23 mmol/L (ref 22–32)
Calcium: 9 mg/dL (ref 8.9–10.3)
Chloride: 103 mmol/L (ref 98–111)
Creatinine, Ser: 0.76 mg/dL (ref 0.61–1.24)
GFR, Estimated: >60 mL/min (ref >60)
Glucose, Bld: 125 mg/dL — ABNORMAL HIGH (ref 70–99)
Potassium: 3.9 mmol/L (ref 3.5–5.1)
Sodium: 137 mmol/L (ref 135–145)

## 2020-09-26 LAB — RESPIRATORY PANEL BY RT PCR (FLU A&B, COVID)
Influenza A by PCR: NEGATIVE
Influenza B by PCR: NEGATIVE
SARS Coronavirus 2 by RT PCR: NEGATIVE

## 2020-09-26 LAB — PHOSPHORUS: Phosphorus: 3.1 mg/dL (ref 2.5–4.6)

## 2020-09-26 LAB — MAGNESIUM: Magnesium: 1.8 mg/dL (ref 1.7–2.4)

## 2020-09-26 LAB — FOLATE: Folate: 3.5 ng/mL — ABNORMAL LOW (ref >5.9)

## 2020-09-26 LAB — VITAMIN B12: Vitamin B-12: 272 pg/mL (ref 180–914)

## 2020-09-26 IMAGING — MR MR MRA HEAD W/O CM
11 of 13 series · 33 of 48 positions shown · IV contrast (gadavist)
Comparison: None.

CLINICAL DATA: Transient ischemic attack

EXAM:
MR HEAD WITHOUT CONTRAST
MR CIRCLE OF WILLIS WITHOUT CONTRAST
MRA OF THE NECK WITHOUT AND WITH CONTRAST
TECHNIQUE: Multiplanar, multiecho pulse sequences of the brain, circle of
willis and surrounding structures were obtained without intravenous
contrast. Angiographic images of the neck were obtained using MRA
technique without and with intravenous contrast.
CONTRAST:  6mL GADAVIST GADOBUTROL 1 MMOL/ML IV SOLN

[Series 5: DWI · axial · 3.0mm · 0.88mm/px · z∈[-97,+42]mm · 5 of 96 slices shown (1 of 4)]
[im 1/96]
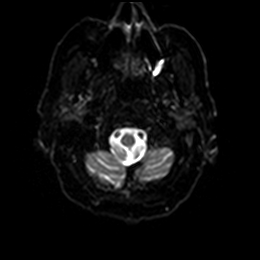
[im 24/96]
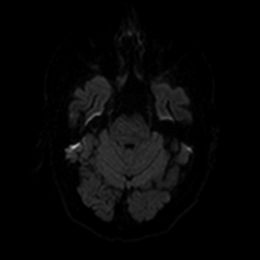
[im 48/96]
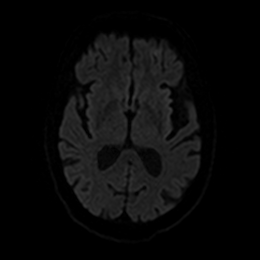
[im 72/96]
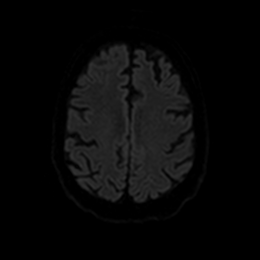
[im 96/96]
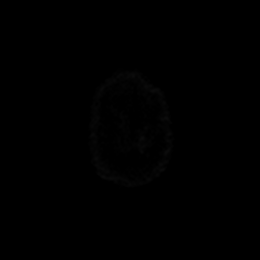

[Series 6: DWI · axial · 3.0mm · 0.88mm/px · z∈[-97,+42]mm · 3 of 48 slices shown (2 of 4)]
[im 1/48]
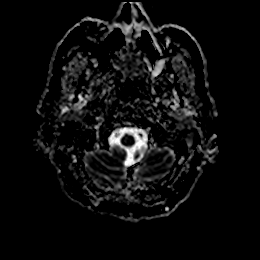
[im 24/48]
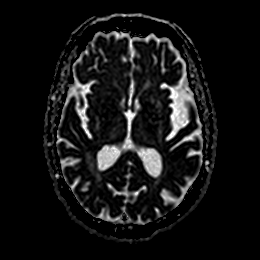
[im 48/48]
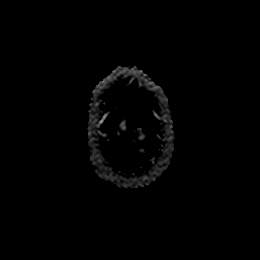

[Series 7: DWI · coronal · 4.0mm · 0.88mm/px · 5 of 72 slices shown (3 of 4)]
[im 1/72]
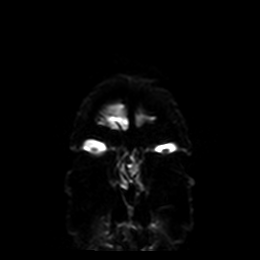
[im 18/72]
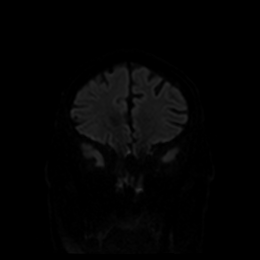
[im 36/72]
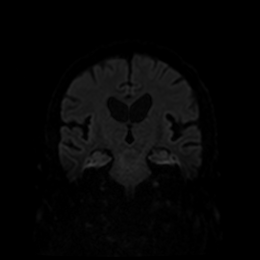
[im 54/72]
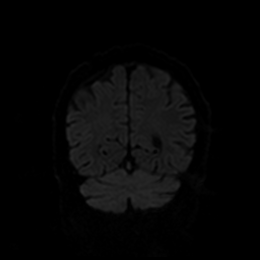
[im 72/72]
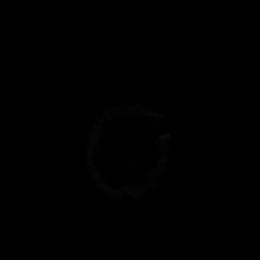

[Series 8: DWI · coronal · 4.0mm · 0.88mm/px · 2 of 36 slices shown (4 of 4)]
[im 1/36]
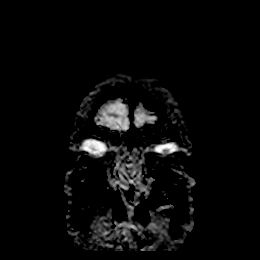
[im 36/36]
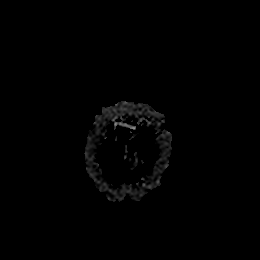

[Series 13: T1 · sagittal · 5.0mm · 0.75mm/px · 1 of 23 slices shown]
[im 1/23]
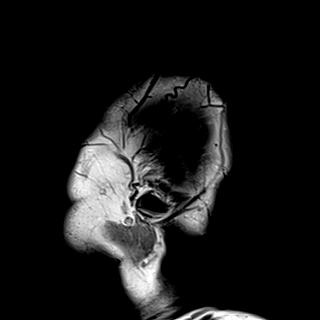

[Series 14: T2 · axial · 5.0mm · 0.72mm/px · z∈[-88,+67]mm · 2 of 27 slices shown (1 of 2)]
[im 1/27]
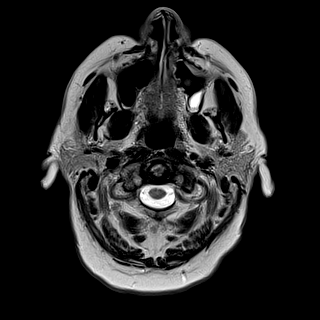
[im 27/27]
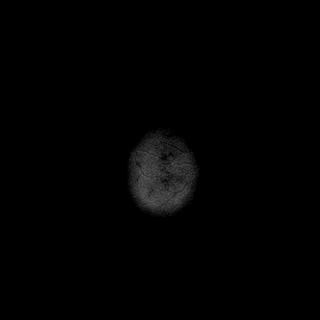

[Series 15: FLAIR · axial · 5.0mm · 0.45mm/px · z∈[-86,+69]mm · 2 of 27 slices shown]
[im 1/27]
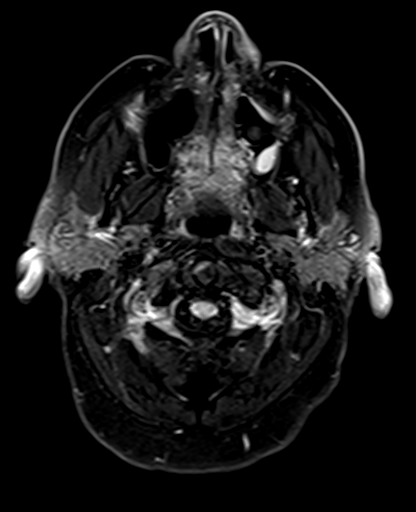
[im 27/27]
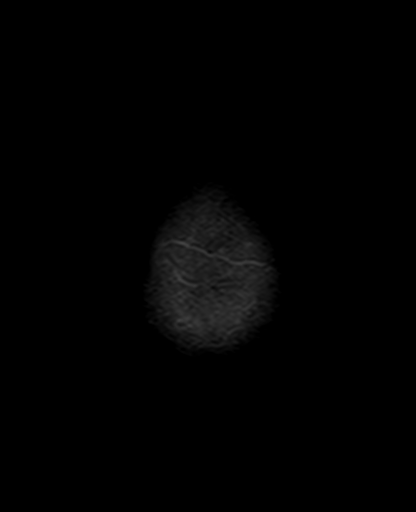

[Series 16: mag_images · axial · 3.0mm · 0.90mm/px · z∈[-90,+74]mm · 4 of 56 slices shown]
[im 1/56]
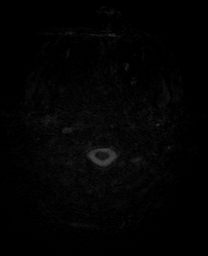
[im 19/56]
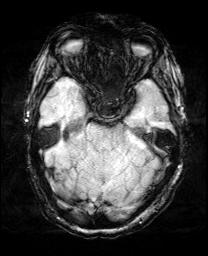
[im 37/56]
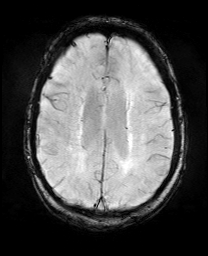
[im 56/56]
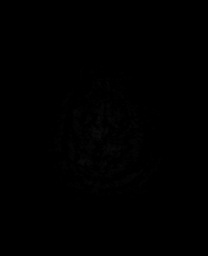

[Series 18: swi_images · axial · 3.0mm · 0.90mm/px · z∈[-90,+74]mm · 4 of 56 slices shown]
[im 1/56]
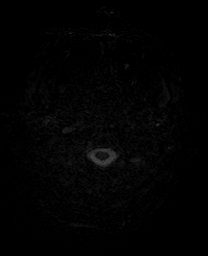
[im 19/56]
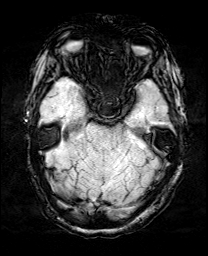
[im 37/56]
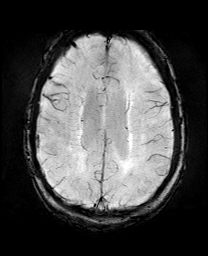
[im 56/56]
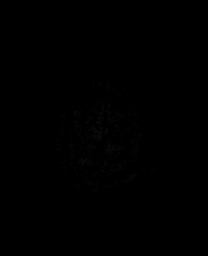

[Series 19: mip_images(sw) · axial · 24.0mm · 0.90mm/px · z∈[-80,+63]mm · 3 of 49 slices shown]
[im 1/49]
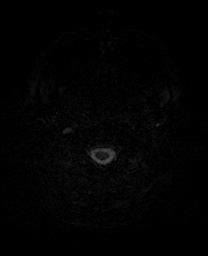
[im 25/49]
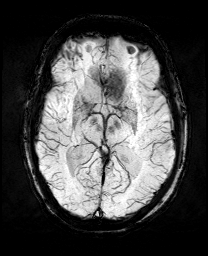
[im 49/49]
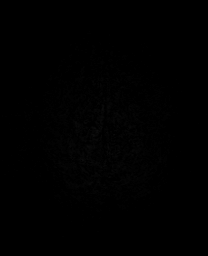

[Series 21: T2 · coronal · 5.0mm · 0.34mm/px · 2 of 29 slices shown (2 of 2)]
[im 1/29]
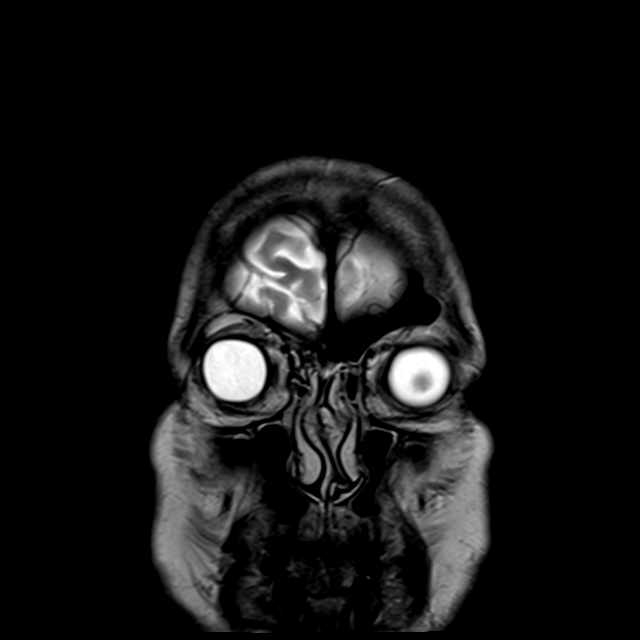
[im 29/29]
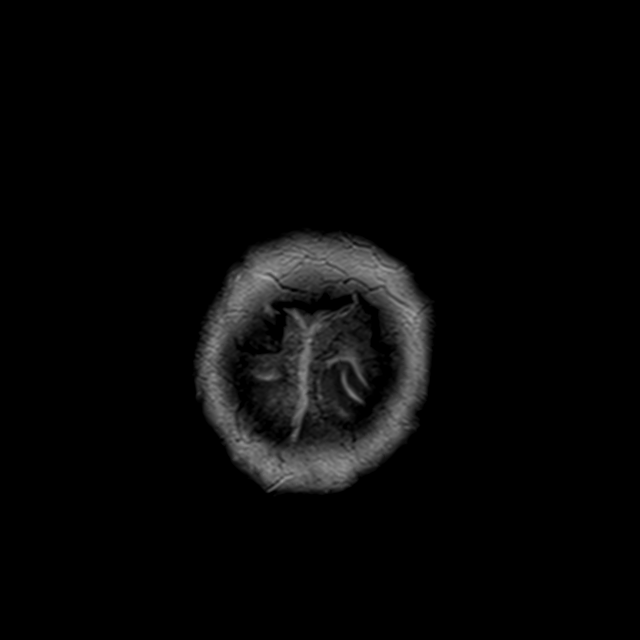

[33 of 48 positions shown; findings below may reference images not displayed]

FINDINGS: MR HEAD FINDINGS

Brain: No acute infarct, acute hemorrhage or extra-axial collection.
Early confluent hyperintense T2-weighted signal of the
periventricular and deep white matter. Normal volume of CSF spaces.
No chronic microhemorrhage. Normal midline structures.

Vascular: Normal flow voids.

Skull and upper cervical spine: Normal marrow signal.

Sinuses/Orbits: Negative.

Other: None.

MR CIRCLE OF WILLIS FINDINGS

POSTERIOR CIRCULATION:

--Vertebral arteries: Normal

--Inferior cerebellar arteries: Normal.

--Basilar artery: Normal.

--Superior cerebellar arteries: Normal.

--Posterior cerebral arteries: Severe stenosis of the proximal left
P2 segment. Normal right.

ANTERIOR CIRCULATION:

--Intracranial internal carotid arteries: Normal.

--Anterior cerebral arteries (ACA): Normal.

--Middle cerebral arteries (MCA): Normal.

ANATOMIC VARIANTS: None

MRA NECK FINDINGS

Normal carotid and vertebral arteries.
IMPRESSION: 1. No acute intracranial abnormality.
2. Severe stenosis of the proximal left P2 segment.
3. Normal MRA of the neck.

## 2020-09-26 IMAGING — MR MR MRA NECK WO/W CM
15 of 17 series · 44 of 48 positions shown · IV contrast (gadavist)
Comparison: None.

CLINICAL DATA: Transient ischemic attack

EXAM:
MR HEAD WITHOUT CONTRAST
MR CIRCLE OF WILLIS WITHOUT CONTRAST
MRA OF THE NECK WITHOUT AND WITH CONTRAST
TECHNIQUE: Multiplanar, multiecho pulse sequences of the brain, circle of
willis and surrounding structures were obtained without intravenous
contrast. Angiographic images of the neck were obtained using MRA
technique without and with intravenous contrast.
CONTRAST:  6mL GADAVIST GADOBUTROL 1 MMOL/ML IV SOLN

[Series 5: DWI · axial · 3.0mm · 0.88mm/px · z∈[-97,+42]mm · 5 of 96 slices shown (1 of 4)]
[im 1/96]
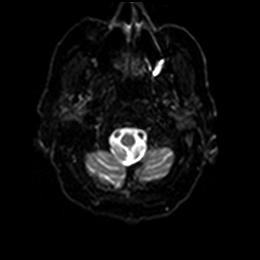
[im 24/96]
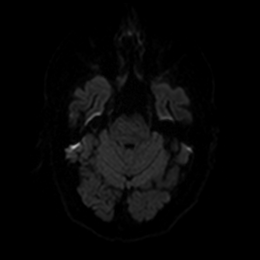
[im 48/96]
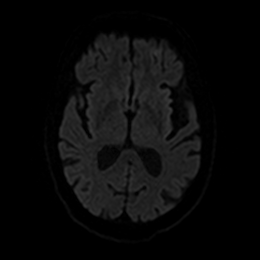
[im 72/96]
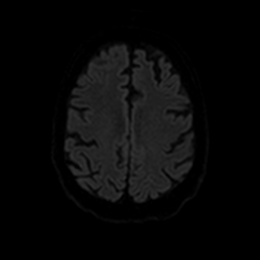
[im 96/96]
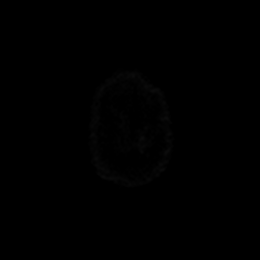

[Series 6: DWI · axial · 3.0mm · 0.88mm/px · z∈[-97,+42]mm · 2 of 48 slices shown (2 of 4)]
[im 1/48]
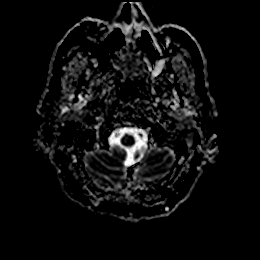
[im 48/48]
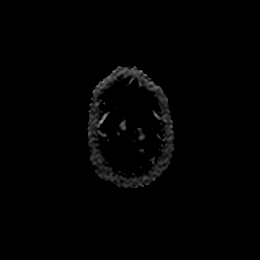

[Series 7: DWI · coronal · 4.0mm · 0.88mm/px · 4 of 72 slices shown (3 of 4)]
[im 1/72]
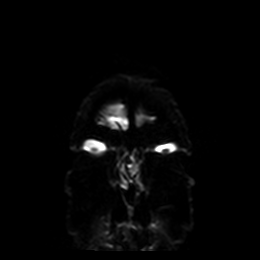
[im 24/72]
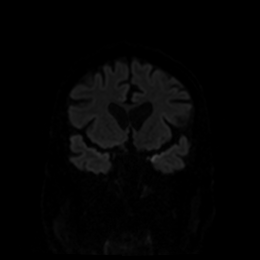
[im 48/72]
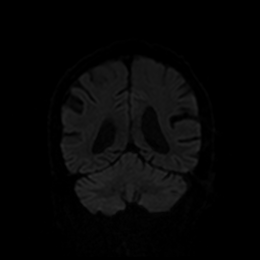
[im 72/72]
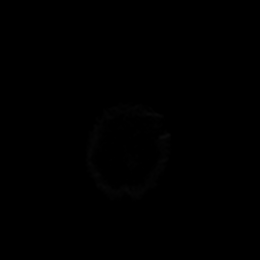

[Series 8: DWI · coronal · 4.0mm · 0.88mm/px · 2 of 36 slices shown (4 of 4)]
[im 1/36]
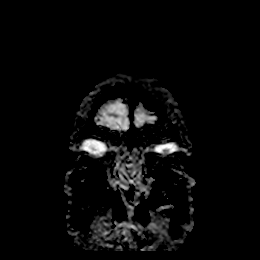
[im 36/36]
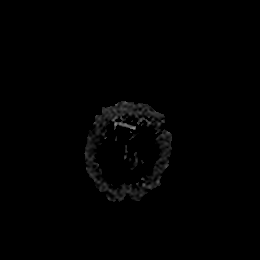

[Series 9: T1 · sagittal · 5.0mm · 0.75mm/px · 1 of 23 slices shown]
[im 1/23]
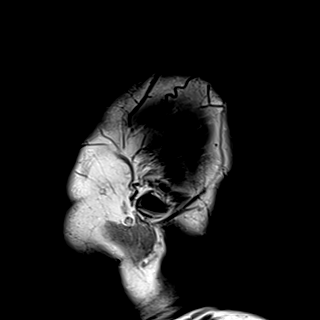

[Series 10: T2 · axial · 5.0mm · 0.72mm/px · 1 of 27 slices shown (1 of 2)]
[im 1/27]
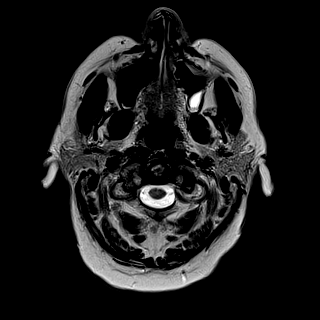

[Series 11: FLAIR · axial · 5.0mm · 0.45mm/px · 1 of 27 slices shown]
[im 1/27]
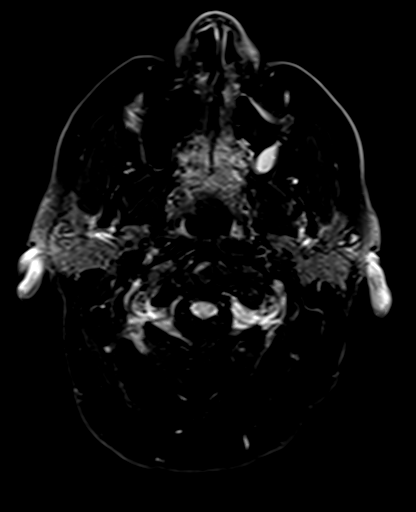

[Series 12: mag_images · axial · 3.0mm · 0.90mm/px · z∈[-90,+74]mm · 3 of 56 slices shown]
[im 1/56]
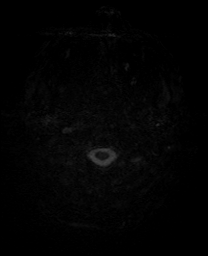
[im 28/56]
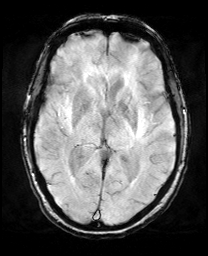
[im 56/56]
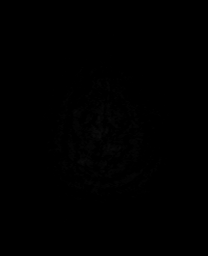

[Series 13: pha_images · axial · 3.0mm · 0.90mm/px · z∈[-90,+74]mm · 3 of 56 slices shown]
[im 1/56]
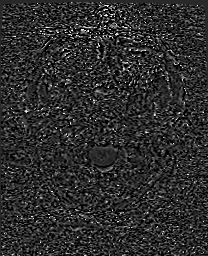
[im 28/56]
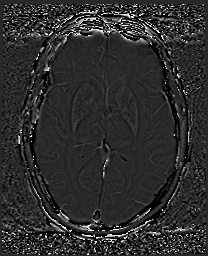
[im 56/56]
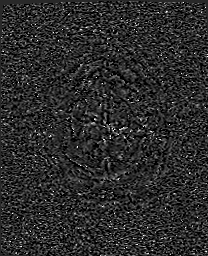

[Series 14: swi_images · axial · 3.0mm · 0.90mm/px · z∈[-90,+74]mm · 3 of 56 slices shown]
[im 1/56]
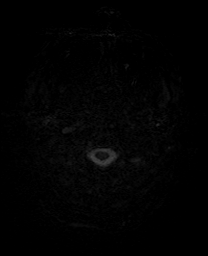
[im 28/56]
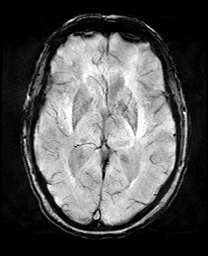
[im 56/56]
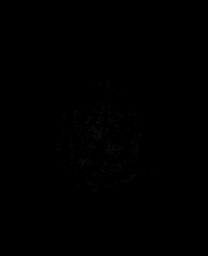

[Series 15: mip_images(sw) · axial · 24.0mm · 0.90mm/px · z∈[-80,+63]mm · 3 of 49 slices shown]
[im 1/49]
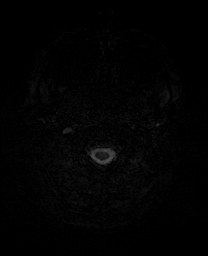
[im 25/49]
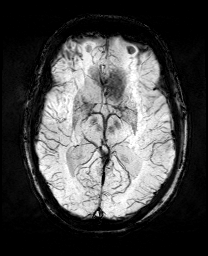
[im 49/49]
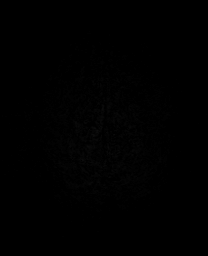

[Series 17: T2 · coronal · 5.0mm · 0.34mm/px · 1 of 29 slices shown (2 of 2)]
[im 1/29]
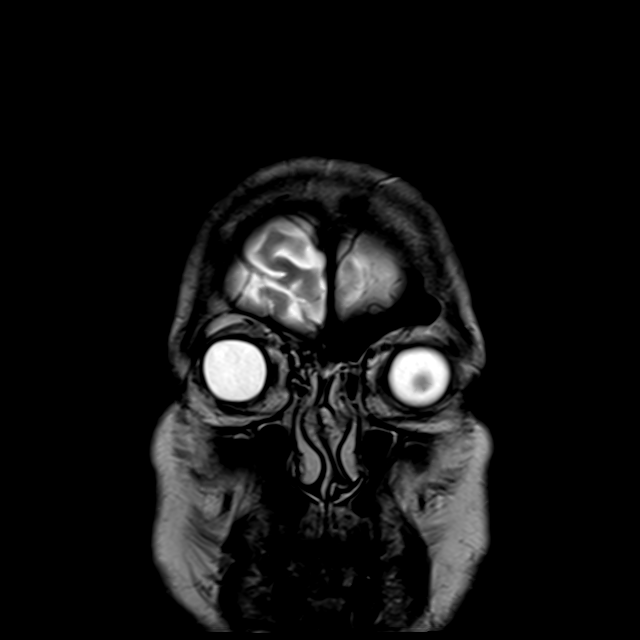

[Series 23: tof_fl3d_tra_iso · axial · 0.6mm · 0.52mm/px · z∈[-191,-113]mm · 7 of 133 slices shown]
[im 1/133]
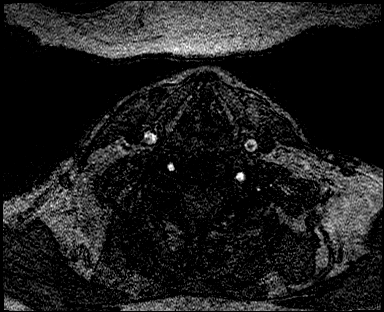
[im 23/133]
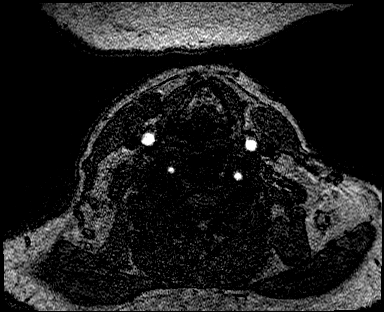
[im 45/133]
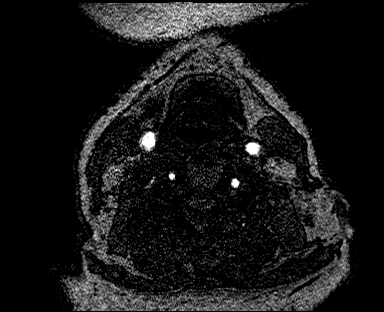
[im 67/133]
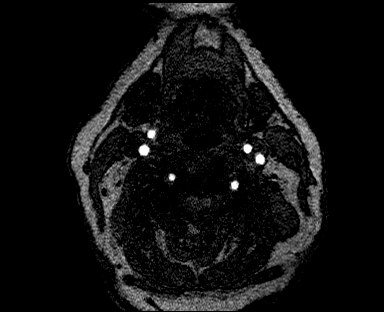
[im 89/133]
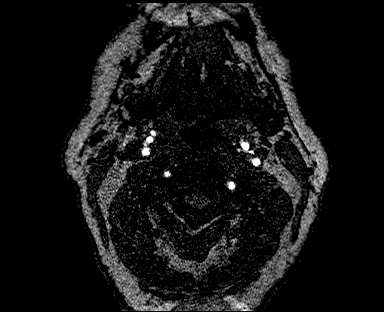
[im 111/133]
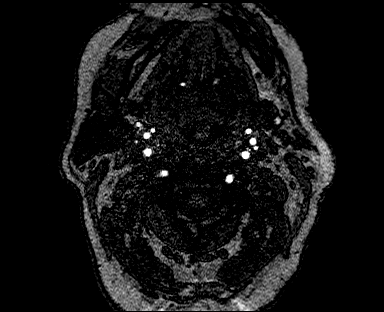
[im 133/133]
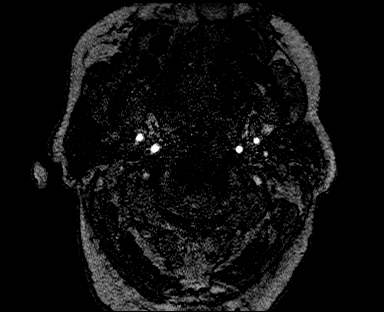

[Series 29: angio_fl3d_cor_pre_ttc=3.0s · coronal · 0.9mm · 0.85mm/px · 4 of 80 slices shown]
[im 1/80]
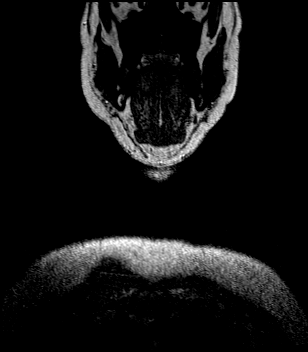
[im 27/80]
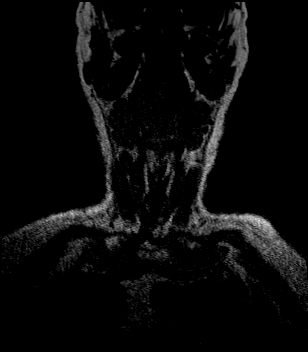
[im 53/80]
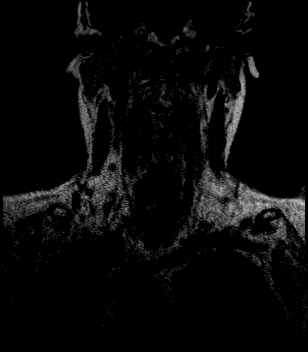
[im 80/80]
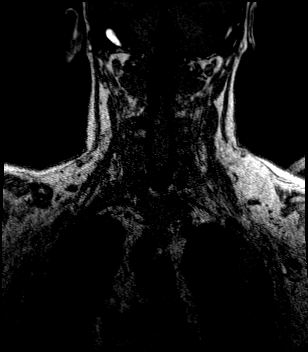

[Series 31: angio_fl3d_cor_post_ttc=3.0s · coronal · 0.9mm · 0.85mm/px · 4 of 80 slices shown]
[im 1/80]
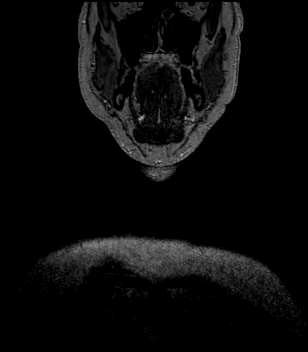
[im 27/80]
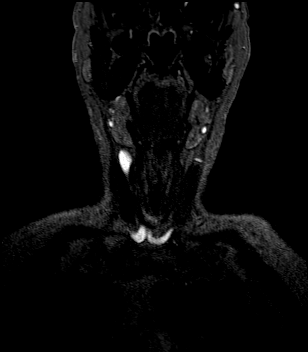
[im 53/80]
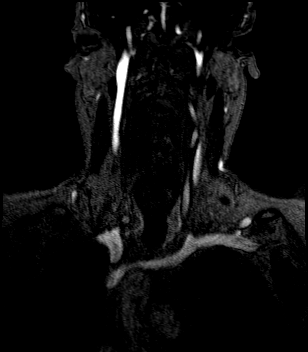
[im 80/80]
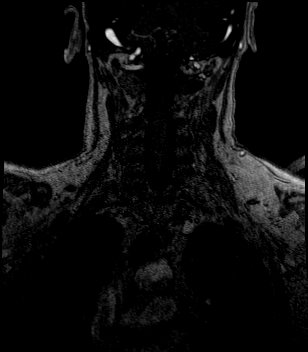

[44 of 48 positions shown; findings below may reference images not displayed]

FINDINGS: MR HEAD FINDINGS

Brain: No acute infarct, acute hemorrhage or extra-axial collection.
Early confluent hyperintense T2-weighted signal of the
periventricular and deep white matter. Normal volume of CSF spaces.
No chronic microhemorrhage. Normal midline structures.

Vascular: Normal flow voids.

Skull and upper cervical spine: Normal marrow signal.

Sinuses/Orbits: Negative.

Other: None.

MR CIRCLE OF WILLIS FINDINGS

POSTERIOR CIRCULATION:

--Vertebral arteries: Normal

--Inferior cerebellar arteries: Normal.

--Basilar artery: Normal.

--Superior cerebellar arteries: Normal.

--Posterior cerebral arteries: Severe stenosis of the proximal left
P2 segment. Normal right.

ANTERIOR CIRCULATION:

--Intracranial internal carotid arteries: Normal.

--Anterior cerebral arteries (ACA): Normal.

--Middle cerebral arteries (MCA): Normal.

ANATOMIC VARIANTS: None

MRA NECK FINDINGS

Normal carotid and vertebral arteries.
IMPRESSION: 1. No acute intracranial abnormality.
2. Severe stenosis of the proximal left P2 segment.
3. Normal MRA of the neck.

## 2020-09-26 MED ORDER — ASPIRIN 81 MG PO CHEW: 81.0000 mg | CHEWABLE_TABLET | Freq: Every day | ORAL | Status: DC

## 2020-09-26 MED ORDER — ACETAMINOPHEN 650 MG RE SUPP: 650.0000 mg | RECTAL | Status: DC | PRN

## 2020-09-26 MED ORDER — ASPIRIN 325 MG PO TBEC
325.0000 mg | DELAYED_RELEASE_TABLET | Freq: Every day | ORAL | 0 refills | Status: DC
Start: 2020-09-27 — End: 2021-02-24

## 2020-09-26 MED ORDER — ENOXAPARIN SODIUM 40 MG/0.4ML ~~LOC~~ SOLN
40.0000 mg | Freq: Every day | SUBCUTANEOUS | Status: DC
  Administered 2020-09-26: 40 mg via SUBCUTANEOUS
  Filled 2020-09-26: qty 0.4

## 2020-09-26 MED ORDER — FOLIC ACID 1 MG PO TABS: 2.0000 mg | ORAL_TABLET | Freq: Every day | ORAL | Status: DC

## 2020-09-26 MED ORDER — VITAMIN B-12 1000 MCG SL SUBL: 1.0000 | SUBLINGUAL_TABLET | Freq: Every day | SUBLINGUAL | Status: DC

## 2020-09-26 MED ORDER — GADOBUTROL 1 MMOL/ML IV SOLN
7.5000 mL | Freq: Once | INTRAVENOUS | Status: AC | PRN
  Administered 2020-09-26: 6 mL via INTRAVENOUS

## 2020-09-26 MED ORDER — STROKE: EARLY STAGES OF RECOVERY BOOK
Freq: Once | Status: AC
  Filled 2020-09-26: qty 1

## 2020-09-26 MED ORDER — LORAZEPAM 1 MG PO TABS: 1.0000 mg | ORAL_TABLET | ORAL | Status: DC | PRN

## 2020-09-26 MED ORDER — CYANOCOBALAMIN 1000 MCG PO TABS: 1000.0000 ug | ORAL_TABLET | Freq: Every day | ORAL | 0 refills | Status: DC

## 2020-09-26 MED ORDER — VENLAFAXINE HCL ER 150 MG PO CP24
150.0000 mg | ORAL_CAPSULE | Freq: Every day | ORAL | Status: DC
  Administered 2020-09-26: 150 mg via ORAL
  Filled 2020-09-26: qty 2
  Filled 2020-09-26: qty 1

## 2020-09-26 MED ORDER — TAMSULOSIN HCL 0.4 MG PO CAPS: 0.4000 mg | ORAL_CAPSULE | Freq: Every day | ORAL | Status: DC

## 2020-09-26 MED ORDER — FOLIC ACID 1 MG PO TABS
2.0000 mg | ORAL_TABLET | Freq: Every day | ORAL | 0 refills | Status: DC
Start: 2020-09-27 — End: 2020-10-27

## 2020-09-26 MED ORDER — PANTOPRAZOLE SODIUM 40 MG PO TBEC
40.0000 mg | DELAYED_RELEASE_TABLET | Freq: Every day | ORAL | Status: DC
  Administered 2020-09-26: 40 mg via ORAL
  Filled 2020-09-26: qty 1

## 2020-09-26 MED ORDER — ADULT MULTIVITAMIN W/MINERALS CH
1.0000 | ORAL_TABLET | Freq: Every day | ORAL | Status: DC
  Administered 2020-09-26: 1 via ORAL
  Filled 2020-09-26: qty 1

## 2020-09-26 MED ORDER — VITAMIN B-12 1000 MCG PO TABS
1000.0000 ug | ORAL_TABLET | Freq: Every day | ORAL | Status: DC
  Administered 2020-09-26: 1000 ug via ORAL
  Filled 2020-09-26: qty 1

## 2020-09-26 MED ORDER — THIAMINE HCL 100 MG/ML IJ SOLN: 100.0000 mg | Freq: Every day | INTRAMUSCULAR | Status: DC

## 2020-09-26 MED ORDER — LORAZEPAM 2 MG/ML IJ SOLN: 1.0000 mg | INTRAMUSCULAR | Status: DC | PRN

## 2020-09-26 MED ORDER — CLOPIDOGREL BISULFATE 75 MG PO TABS
75.0000 mg | ORAL_TABLET | Freq: Every day | ORAL | 0 refills | Status: DC
Start: 2020-09-27 — End: 2020-12-16

## 2020-09-26 MED ORDER — ACETAMINOPHEN 325 MG PO TABS: 650.0000 mg | ORAL_TABLET | ORAL | Status: DC | PRN

## 2020-09-26 MED ORDER — ASPIRIN EC 325 MG PO TBEC
325.0000 mg | DELAYED_RELEASE_TABLET | Freq: Every day | ORAL | Status: DC
  Administered 2020-09-26: 325 mg via ORAL
  Filled 2020-09-26: qty 1

## 2020-09-26 MED ORDER — ATORVASTATIN CALCIUM 40 MG PO TABS
40.0000 mg | ORAL_TABLET | Freq: Every day | ORAL | Status: DC
  Administered 2020-09-26: 40 mg via ORAL
  Filled 2020-09-26: qty 1

## 2020-09-26 MED ORDER — THIAMINE HCL 100 MG PO TABS
100.0000 mg | ORAL_TABLET | Freq: Every day | ORAL | Status: DC
  Administered 2020-09-26: 100 mg via ORAL
  Filled 2020-09-26: qty 1

## 2020-09-26 MED ORDER — FOLIC ACID 1 MG PO TABS
1.0000 mg | ORAL_TABLET | Freq: Every day | ORAL | Status: DC
  Administered 2020-09-26: 1 mg via ORAL
  Filled 2020-09-26: qty 1

## 2020-09-26 MED ORDER — ATORVASTATIN CALCIUM 40 MG PO TABS
40.0000 mg | ORAL_TABLET | Freq: Every day | ORAL | 0 refills | Status: DC
Start: 2020-09-27 — End: 2020-12-08

## 2020-09-26 MED ORDER — CLOPIDOGREL BISULFATE 75 MG PO TABS
75.0000 mg | ORAL_TABLET | Freq: Every day | ORAL | Status: DC
  Administered 2020-09-26: 75 mg via ORAL
  Filled 2020-09-26: qty 1

## 2020-09-26 MED ORDER — ACETAMINOPHEN 160 MG/5ML PO SOLN: 650.0000 mg | ORAL | Status: DC | PRN

## 2020-09-26 NOTE — Progress Notes (Signed)
Patient ready for discharge to home; discharge instructions given and reviewed; Rx's sent electronically. Stroke education reviewed; patient discharged out via wheelchair.

## 2020-09-26 NOTE — Progress Notes (Signed)
*  PRELIMINARY RESULTS* Echocardiogram 2D Echocardiogram has been performed.  Walter Cross 09/26/2020, 11:34 AM

## 2020-09-26 NOTE — Progress Notes (Addendum)
STROKE TEAM PROGRESS NOTE   INTERVAL HISTORY His RN is at the bedside.  Pt lying in bed, no complains. He stated that his brother had afib and stroke. He denies heart palpitation. We dicussed about outpt 30 day cardiac event monitoring.   OBJECTIVE Vitals:   09/26/20 0345 09/26/20 0622 09/26/20 0757 09/26/20 1125  BP: 134/73 131/73 (!) 151/92 139/77  Pulse: 70 70    Resp: 12 14    Temp: 98 F (36.7 C)  97.9 F (36.6 C) 98.5 F (36.9 C)  TempSrc: Oral  Oral Oral  SpO2: 98% 96%    Weight:      Height:        CBC:  Recent Labs  Lab 09/25/20 2149 09/25/20 2154  WBC 6.9  --   NEUTROABS 4.3  --   HGB 15.9 15.6  HCT 46.0 46.0  MCV 100.4*  --   PLT 236  --     Basic Metabolic Panel:  Recent Labs  Lab 09/25/20 2149 09/25/20 2149 09/25/20 2154 09/26/20 0306  NA 133*   < > 134* 137  K 3.7   < > 3.7 3.9  CL 100   < > 101 103  CO2 20*  --   --  23  GLUCOSE 157*   < > 152* 125*  BUN 8   < > 7* 8  CREATININE 0.83   < > 1.10 0.76  CALCIUM 9.0  --   --  9.0  MG  --   --   --  1.8  PHOS  --   --   --  3.1   < > = values in this interval not displayed.    Lipid Panel:     Component Value Date/Time   CHOL 177 09/26/2020 0306   TRIG 114 09/26/2020 0306   HDL 65 09/26/2020 0306   CHOLHDL 2.7 09/26/2020 0306   VLDL 23 09/26/2020 0306   LDLCALC 89 09/26/2020 0306   LDLCALC 97 04/01/2019 1110   HgbA1c:  Lab Results  Component Value Date   HGBA1C 5.5 09/26/2020   Urine Drug Screen:     Component Value Date/Time   LABOPIA NONE DETECTED 09/25/2020 2235   COCAINSCRNUR NONE DETECTED 09/25/2020 2235   LABBENZ NONE DETECTED 09/25/2020 2235   AMPHETMU NONE DETECTED 09/25/2020 2235   THCU NONE DETECTED 09/25/2020 2235   LABBARB NONE DETECTED 09/25/2020 2235    Alcohol Level     Component Value Date/Time   ETH 176 (H) 09/25/2020 2149    IMAGING  MR BRAIN WO CONTRAST MR ANGIO HEAD WO CONTRAST MR ANGIO NECK W WO CONTRAST 09/26/2020 IMPRESSION:  1. No acute  intracranial abnormality.  2. 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  3. Normal MRA of the neck.   CT HEAD CODE STROKE WO CONTRAST 09/25/2020 IMPRESSION:  1. No acute intracranial process.  2. ASPECTS is 10  3. Mild cerebral atrophy and chronic microvascular ischemic changes.   ECHOCARDIOGRAM COMPLETE 09/26/2020 IMPRESSIONS   1. Left ventricular ejection fraction, by estimation, is 60 to 65%. The left ventricle has normal function. The left ventricle has no regional wall motion abnormalities. There is mild concentric left ventricular hypertrophy. Left ventricular diastolic parameters are indeterminate. Elevated left ventricular end-diastolic pressure.   2. Right ventricular systolic function is normal. The right ventricular size is normal. Tricuspid regurgitation signal is inadequate for assessing PA pressure.   3. The mitral valve is normal in structure. Trivial mitral valve regurgitation. No evidence of  mitral stenosis.   4. The aortic valve is normal in structure. Aortic valve regurgitation is not visualized. No aortic stenosis is present.   5. The inferior vena cava is normal in size with greater than 50% respiratory variability, suggesting right atrial pressure of 3 mmHg.   ECG - SR rate 68 BPM. (See cardiology reading for complete details)   PHYSICAL EXAM  Temp:  [96.7 F (35.9 C)-98.5 F (36.9 C)] 98.5 F (36.9 C) (10/30 1125) Pulse Rate:  [65-70] 70 (10/30 0622) Resp:  [9-19] 14 (10/30 0622) BP: (106-151)/(59-92) 139/77 (10/30 1125) SpO2:  [94 %-98 %] 96 % (10/30 0622) Weight:  [35.6 kg-82.9 kg] 35.6 kg (10/30 0040)  General - Well nourished, well developed, in no apparent distress.  Ophthalmologic - fundi not visualized due to noncooperation.  Cardiovascular - Regular rhythm and rate.  Mental Status -  Level of arousal and orientation to time, place, and person were intact. Language including expression, naming, repetition, comprehension was assessed and  found intact. Attention span and concentration were normal. Fund of Knowledge was assessed and was intact.  Cranial Nerves II - XII - II - Visual field intact OU. III, IV, VI - Extraocular movements intact. V - Facial sensation intact bilaterally. VII - Facial movement intact bilaterally. VIII - Hearing & vestibular intact bilaterally. X - Palate elevates symmetrically. XI - Chin turning & shoulder shrug intact bilaterally. XII - Tongue protrusion intact.  Motor Strength - The patient's strength was normal in all extremities and pronator drift was absent.  Bulk was normal and fasciculations were absent.   Motor Tone - Muscle tone was assessed at the neck and appendages and was normal.  Reflexes - The patient's reflexes were symmetrical in all extremities and he had no pathological reflexes.  Sensory - Light touch, temperature/pinprick were assessed and were symmetrical.    Coordination - The patient had normal movements in the hands and feet with no ataxia or dysmetria.  Tremor was absent.  Gait and Station - deferred.   ASSESSMENT/PLAN [name] is a 69 y.o. male with history of previous HTN, HLD, cardiomyopathy, and L4-5 surgery presenting with transient sudden right sided numbness and difficulty putting his glasses in his pocket. He did not receive IV t-PA due to resolution of deficits.   TIA - etiology unclear  CT Head -  No acute intracranial process.   MRI head - No acute intracranial abnormality.   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 Normal MRA of the neck.   2D Echo - EF 60 - 65%. No cardiac source of emboli identified.   30 day cardiac event monitoring as outpt to rule out afib  [employer] Virus 2 - negative  LDL - 89  HgbA1c - 5.5  UDS - negative  ETH - 176  VTE prophylaxis - Lovenox  aspirin 81 mg daily prior to admission, now on aspirin 325 mg daily and clopidogrel 75 mg daily DAPT for 3 months and then  plavix alone.  Patient counseled to be compliant with his antithrombotic medications  Ongoing aggressive stroke risk factor management  Therapy recommendations:  pending  Disposition:  Pending  Hypertension  Home BP meds: Altace ; Coreg ; Cartia  Current BP meds: none   Stable . Long-term BP goal normotensive  Hyperlipidemia  Home Lipid lowering medication: none   LDL 89, goal < 70  Current lipid lowering medication: Lipitor 40 mg daily   Continue statin at  discharge  FA deficiency  Folate 3.5  Macrocytosis without anemia   On supplement  Other Stroke Risk Factors  Advanced age  Former cigarette smoker - quit  ETOH use, advised to drink no more than 1 alcoholic beverage per day.  Family hx stroke (brother 2/2 afib)   Other Active Problems  Code status - Full code  [facility] day # 0  Neurology will sign off. Please call with questions. Pt will follow up with stroke clinic NP at [facility] in about 4 weeks. Thanks for the consult.  Rosalin Hawking, MD PhD Stroke Neurology 09/26/2020 3:45 PM   To contact Stroke Continuity provider, please refer to [email or web address] After hours, contact General Neurology

## 2020-09-26 NOTE — Evaluation (Signed)
Physical Therapy Evaluation Patient Details Name: Walter Cross MRN: [id number] DOB: [birth year] Today's Date: 09/26/2020   History of Present Illness  Walter Cross is a 69 y.o. male with medical history significant of hypertension, hyperlipidemia, GERD, depression, anxiety, BPH presenting to the ED as code stroke with complaints of right-sided numbness.  MRI negative for acute changes.  Clinical Impression  Patient presents with mobility close to functional baseline.  Reports some residual sensory changes on R lower leg and foot, but demonstrates low fall risk with DGI 22/24 (scores <19 demonstrate fall risk).  Feel patient without need for follow up PT or DME.  Will sign off.     Follow Up Recommendations No PT follow up    Equipment Recommendations  None recommended by PT    Recommendations for Other Services       Precautions / Restrictions Precautions Precautions: None      Mobility  Bed Mobility Overal bed mobility: Independent                  Transfers Overall transfer level: Independent                  Ambulation/Gait Ambulation/Gait assistance: Independent Gait Distance (Feet): 150 Feet Assistive device: None Gait Pattern/deviations: WFL(Within Functional Limits)        Stairs Stairs: Yes Stairs assistance: Modified independent (Device/Increase time) Stair Management: Alternating pattern;Forwards;One rail Right Number of Stairs: 2 General stair comments: held rail, reports has post at home.  Wheelchair Mobility    Modified Rankin (Stroke Patients Only) Modified Rankin (Stroke Patients Only) Pre-Morbid Rankin Score: No symptoms Modified Rankin: No significant disability     Balance Overall balance assessment: Independent                               Standardized Balance Assessment Standardized Balance Assessment : Dynamic Gait Index   Dynamic Gait Index Level Surface: Normal Change in Gait Speed:  Normal Gait with Horizontal Head Turns: Normal Gait with Vertical Head Turns: Mild Impairment Gait and Pivot Turn: Normal Step Over Obstacle: Normal Step Around Obstacles: Normal Steps: Mild Impairment Total Score: 22       Pertinent Vitals/Pain Pain Assessment: No/denies pain    Home Living Family/patient expects to be discharged to:: Private residence Living Arrangements: Spouse/significant other Available Help at Discharge: Family Type of Home: House Home Access: Stairs to enter Entrance Stairs-Rails: None Technical brewer of Steps: 2 Home Layout: One level Home Equipment: Grab bars - tub/shower      Prior Function Level of Independence: Independent               Hand Dominance   Dominant Hand: Right    Extremity/Trunk Assessment   Upper Extremity Assessment Upper Extremity Assessment: LUE deficits/detail LUE Deficits / Details: mild L sensory deficit pt reports due to ulnar nerve compression, noted hypothenar wasting, but pt reports good functional use    Lower Extremity Assessment Lower Extremity Assessment: RLE deficits/detail RLE Deficits / Details: mild residual numbness in foot and lower leg, strength WFL       Communication   Communication: No difficulties  Cognition Arousal/Alertness: Awake/alert Behavior During Therapy: WFL for tasks assessed/performed Overall Cognitive Status: Within Functional Limits for tasks assessed  General Comments General comments (skin integrity, edema, etc.): able to pick up item from floor, turn in circle and balance EC x 10 sec    Exercises     Assessment/Plan    PT Assessment Patent does not need any further PT services  PT Problem List         PT Treatment Interventions      PT Goals (Current goals can be found in the Care Plan section)  Acute Rehab PT Goals PT Goal Formulation: All assessment and education complete, DC therapy     Frequency     Barriers to discharge        Co-evaluation               AM-PAC PT "6 Clicks" Mobility  Outcome Measure Help needed turning from your back to your side while in a flat bed without using bedrails?: None Help needed moving from lying on your back to sitting on the side of a flat bed without using bedrails?: None Help needed moving to and from a bed to a chair (including a wheelchair)?: None Help needed standing up from a chair using your arms (e.g., wheelchair or bedside chair)?: None Help needed to walk in hospital room?: None Help needed climbing 3-5 steps with a railing? : None 6 Click Score: 24    End of Session   Activity Tolerance: Patient tolerated treatment well Patient left: in chair;with call bell/phone within reach;with family/visitor present   PT Visit Diagnosis: Other symptoms and signs involving the nervous system (T70.017)    Time: 4944-9675 PT Time Calculation (min) (ACUTE ONLY): 18 min   Charges:   PT Evaluation $PT Eval Low Complexity: 1 Low          Magda Kiel, PT Acute Rehabilitation Services Pager:[phone] Office:[phone] 09/26/2020   Reginia Naas 09/26/2020, 10:02 AM

## 2020-09-26 NOTE — ED Notes (Signed)
This RN attempted to call report to 3W RN. Was told by 3W secretary that 3W RN is "caught up" and will take report in 10 minutes. This RN provided work phone number to [employer].

## 2020-09-26 NOTE — Progress Notes (Signed)
OT Cancellation Note  Patient Details Name: Walter Cross MRN: [id number] DOB: October 21, 1951   Cancelled Treatment:    Reason Eval/Treat Not Completed: OT screened, no needs identified, will sign off. Per chart review and communication with PT, pt is at/close to baseline with ADLs.   Hortencia Pilar 09/26/2020, 2:02 PM

## 2020-09-26 NOTE — Discharge Summary (Signed)
PATIENT DETAILS Name: Walter Cross Age: 69 y.o. Sex: male Date of Birth: [birth year] MRN: [id number]. Admitting Physician: Shela Leff, MD VOZ:[id number], Cammie Mcgee, MD  Admit Date: 09/25/2020 Discharge date: 09/26/2020  Recommendations for Outpatient Follow-up:  1. Follow up with PCP in 1-2 weeks 2. Please obtain CMP/CBC in one week 3. Please ensure follow-up with cardiology for 30-day event monitor 4. Has low folic acid levels-borderline low vitamin B12 levels-we will need supplementation-PCP to follow 5. Aspirin/Plavix for 3 months-followed by Plavix alone, also started on statin. 6. Counseled regarding the importance of minimizing/cessation of EtOH use  Admitted From:  Home   Disposition: [place]: No  Equipment/Devices: None  Discharge Condition: Stable  CODE STATUS: FULL CODE  Diet recommendation:  Diet Order            Diet Heart Room service appropriate? Yes; Fluid consistency: Thin  Diet effective now           Diet - low sodium heart healthy                  Brief Summary: Patient is a 69 year old male with history of HTN, BPH, HOCM-who presented with right-sided numbness-thought to have TIA.  See below for further details.  Brief Hospital Course: TIA: Had transient right-sided numbness-that resolved while the patient was in the emergency room.  He had no other focal deficits.  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  Echo was with preserved EF and without any embolic source.  A1c was 5.5, LDL 89.  Evaluated by neurology with recommendations to start aspirin/Plavix for 3 months, followed by Plavix alone, will also be started on high intensity statin.  Neurology recommending 30-day event monitor to be done in the outpatient setting.  I have sent a epic message to cardiology regarding need for outpatient monitor.  Folic acid  deficiency: Start supplementation-follow-up with PCP.  Also has low borderline vitamin B12 levels that probably needs supplementation at the discretion of PCP.  EtOH use (EtOH level of 176 on presentation): Acknowledges drinking several glasses of wine on a daily basis-no withdrawal symptoms-patient will be on a statin-he was counseled regarding the need to minimize or stop alcohol use altogether.  Follow with PCP.  Rest of patient's medical issues were stable during this short overnight hospital stay.  Discharge Diagnoses:  Principal Problem:   TIA (transient ischemic attack) Active Problems:   Anxiety and depression   HTN (hypertension)   Macrocytosis without anemia   Metabolic acidosis   Discharge Instructions:  Activity:  As tolerated   Discharge Instructions    Ambulatory referral to Neurology   Complete by: As directed    An appointment is requested in approximately: 4 weeks   Call MD for:  extreme fatigue   Complete by: As directed    Call MD for:  persistant dizziness or light-headedness   Complete by: As directed    Diet - low sodium heart healthy   Complete by: As directed    Discharge instructions   Complete by: As directed    Follow with Primary MD  Susy Frizzle, MD in 1-2 weeks  Cardiology office will contact you for a outpatient 30-day heart monitor   Please get a complete blood count and chemistry panel checked by your Primary MD at your next visit, and again as instructed by your Primary MD.  Get Medicines  reviewed and adjusted: Please take all your medications with you for your next visit with your Primary MD  Laboratory/radiological data: Please request your Primary MD to go over all hospital tests and procedure/radiological results at the follow up, please ask your Primary MD to get all Hospital records sent to his/her office.  In some cases, they will be blood work, cultures and biopsy results pending at the time of your discharge. Please request  that your primary care M.D. follows up on these results.  Also Note the following: If you experience worsening of your admission symptoms, develop shortness of breath, life threatening emergency, suicidal or homicidal thoughts you must seek medical attention immediately by calling 911 or calling your MD immediately  if symptoms less severe.  You must read complete instructions/literature along with all the possible adverse reactions/side effects for all the Medicines you take and that have been prescribed to you. Take any new Medicines after you have completely understood and accpet all the possible adverse reactions/side effects.   Do not drive when taking Pain medications or sleeping medications (Benzodaizepines)  Do not take more than prescribed Pain, Sleep and Anxiety Medications. It is not advisable to combine anxiety,sleep and pain medications without talking with your primary care practitioner  Special Instructions: If you have smoked or chewed Tobacco  in the last 2 yrs please stop smoking, stop any regular Alcohol  and or any Recreational drug use.  Wear Seat belts while driving.  Please note: You were cared for by a hospitalist during your hospital stay. Once you are discharged, your primary care physician will handle any further medical issues. Please note that NO REFILLS for any discharge medications will be authorized once you are discharged, as it is imperative that you return to your primary care physician (or establish a relationship with a primary care physician if you do not have one) for your post hospital discharge needs so that they can reassess your need for medications and monitor your lab values.   Increase activity slowly   Complete by: As directed      Allergies as of 09/26/2020      Reactions   Esomeprazole Magnesium Other (See Comments)   cramps   Septra [bactrim] Other (See Comments)   Back ache   Sulfamethoxazole-trimethoprim    Other reaction(s): Other (See  Comments) Back ache Back ache Back ache      Medication List    STOP taking these medications   aspirin 81 MG tablet Replaced by: aspirin 325 MG EC tablet   VITAMIN D PO     TAKE these medications   acyclovir ointment 5 % Commonly known as: ZOVIRAX Apply 1 application topically daily as needed (cold sores & herpes).   aspirin 325 MG EC tablet Take 1 tablet (325 mg total) by mouth daily. Start taking on: September 27, 2020 Replaces: aspirin 81 MG tablet   atorvastatin 40 MG tablet Commonly known as: LIPITOR Take 1 tablet (40 mg total) by mouth daily. Start taking on: September 27, 2020   carvedilol 12.5 MG tablet Commonly known as: COREG Take 1 tablet (12.5 mg total) by mouth 2 (two) times daily with a meal.   clonazePAM 0.5 MG tablet Commonly known as: KLONOPIN TAKE 1 TABLET(0.5 MG) BY MOUTH AT BEDTIME AS NEEDED FOR ANXIETY OR SLEEP What changed: See the new instructions.   clopidogrel 75 MG tablet Commonly known as: PLAVIX Take 1 tablet (75 mg total) by mouth daily. Start taking on: September 27, 2020  diltiazem 180 MG 24 hr capsule Commonly known as: Cartia XT TAKE 1 CAPSULE(180 MG) BY MOUTH DAILY   folic acid 1 MG tablet Commonly known as: FOLVITE Take 2 tablets (2 mg total) by mouth daily. Start taking on: September 27, 2020   ibuprofen 200 MG tablet Commonly known as: ADVIL Take 200 mg by mouth every 4 (four) hours as needed for headache, mild pain or moderate pain.   nystatin-triamcinolone cream Commonly known as: MYCOLOG II Apply 1 application topically as needed.   pantoprazole 40 MG tablet Commonly known as: PROTONIX Take 40 mg by mouth daily.   predniSONE 20 MG tablet Commonly known as: DELTASONE 3 tabs poqday 1-2, 2 tabs poqday 3-4, 1 tab poqday 5-6 What changed:   how much to take  how to take this  when to take this  reasons to take this  additional instructions   ramipril 10 MG capsule Commonly known as: ALTACE TAKE 1 CAPSULE(10  MG) BY MOUTH DAILY What changed: See the new instructions.   sildenafil 20 MG tablet Commonly known as: REVATIO 3-5 tablets daily as needed   tamsulosin 0.4 MG Caps capsule Commonly known as: FLOMAX Take 0.4 mg by mouth at bedtime.   venlafaxine XR 150 MG 24 hr capsule Commonly known as: EFFEXOR-XR TAKE 1 CAPSULE(150 MG) BY MOUTH DAILY WITH BREAKFAST What changed: See the new instructions.   Vitamin B-12 1000 MCG Subl Place 1 tablet under the tongue daily. What changed: Another medication with the same name was added. Make sure you understand how and when to take each.   cyanocobalamin 1000 MCG tablet Take 1 tablet (1,000 mcg total) by mouth daily. Start taking on: September 27, 2020 What changed: You were already taking a medication with the same name, and this prescription was added. Make sure you understand how and when to take each.       Follow-up Information    Susy Frizzle, MD. Schedule an appointment as soon as possible for a visit in 1 week(s).   Specialty: Family Medicine Contact information: [address] [place] [address] [phone]        Nahser, [name], MD Follow up in 2 week(s).   Specialty: Cardiology Contact information: [place] [address] [place] [address] [phone]        [facility] Follow up.   Specialty: Neurology Why: office will call for a follow up appt Contact information: [place] [phone]             Allergies  Allergen Reactions  . Esomeprazole Magnesium Other (See Comments)    cramps  . Septra [Bactrim] Other (See Comments)    Back ache  . Sulfamethoxazole-Trimethoprim     Other reaction(s): Other (See Comments) Back ache Back ache Back ache     Other Procedures/Studies: MR ANGIO HEAD WO CONTRAST  Result Date: 09/26/2020 CLINICAL DATA:  Transient ischemic attack EXAM: MR HEAD WITHOUT CONTRAST MR CIRCLE  OF WILLIS WITHOUT CONTRAST MRA OF THE NECK WITHOUT AND WITH CONTRAST TECHNIQUE: Multiplanar, multiecho pulse sequences of the brain, circle of willis and surrounding structures were obtained without intravenous contrast. Angiographic images of the neck were obtained using MRA technique without and with intravenous contrast. CONTRAST:  33mL GADAVIST GADOBUTROL 1 MMOL/ML IV SOLN COMPARISON:  None. FINDINGS: MR HEAD FINDINGS Brain: No acute infarct, acute hemorrhage or extra-axial collection. Early confluent hyperintense T2-weighted signal of the periventricular and deep white  matter. Normal volume of CSF spaces. No chronic microhemorrhage. Normal midline structures. Vascular: Normal flow voids. Skull and upper cervical spine: Normal marrow signal. Sinuses/Orbits: Negative. Other: None. MR CIRCLE OF WILLIS FINDINGS POSTERIOR CIRCULATION: --Vertebral arteries: Normal --Inferior cerebellar arteries: Normal. --Basilar artery: Normal. --Superior cerebellar arteries: Normal. 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 Normal right. ANTERIOR CIRCULATION: --Intracranial internal carotid arteries: Normal. --Anterior cerebral arteries (ACA): Normal. --Middle cerebral arteries (MCA): Normal. ANATOMIC VARIANTS: None MRA NECK FINDINGS Normal carotid and vertebral arteries. IMPRESSION: 1. No acute intracranial abnormality. 2. 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 3. Normal MRA of the neck. Electronically Signed   By: Ulyses Jarred M.D.   On: 09/26/2020 02:56   MR ANGIO NECK W WO CONTRAST  Result Date: 09/26/2020 CLINICAL DATA:  Transient ischemic attack EXAM: MR HEAD WITHOUT CONTRAST MR CIRCLE OF WILLIS WITHOUT CONTRAST MRA OF THE NECK WITHOUT AND WITH CONTRAST TECHNIQUE: Multiplanar, multiecho pulse sequences of the brain, circle of willis and surrounding structures were obtained without intravenous contrast. Angiographic images of the neck were obtained using MRA technique  without and with intravenous contrast. CONTRAST:  36mL GADAVIST GADOBUTROL 1 MMOL/ML IV SOLN COMPARISON:  None. FINDINGS: MR HEAD FINDINGS Brain: No acute infarct, acute hemorrhage or extra-axial collection. Early confluent hyperintense T2-weighted signal of the periventricular and deep white matter. Normal volume of CSF spaces. No chronic microhemorrhage. Normal midline structures. Vascular: Normal flow voids. Skull and upper cervical spine: Normal marrow signal. Sinuses/Orbits: Negative. Other: None. MR CIRCLE OF WILLIS FINDINGS POSTERIOR CIRCULATION: --Vertebral arteries: Normal --Inferior cerebellar arteries: Normal. --Basilar artery: Normal. --Superior cerebellar arteries: Normal. 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 Normal right. ANTERIOR CIRCULATION: --Intracranial internal carotid arteries: Normal. --Anterior cerebral arteries (ACA): Normal. --Middle cerebral arteries (MCA): Normal. ANATOMIC VARIANTS: None MRA NECK FINDINGS Normal carotid and vertebral arteries. IMPRESSION: 1. No acute intracranial abnormality. 2. 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 3. Normal MRA of the neck. Electronically Signed   By: Ulyses Jarred M.D.   On: 09/26/2020 02:56   MR BRAIN WO CONTRAST  Result Date: 09/26/2020 CLINICAL DATA:  Transient ischemic attack EXAM: MR HEAD WITHOUT CONTRAST MR CIRCLE OF WILLIS WITHOUT CONTRAST MRA OF THE NECK WITHOUT AND WITH CONTRAST TECHNIQUE: Multiplanar, multiecho pulse sequences of the brain, circle of willis and surrounding structures were obtained without intravenous contrast. Angiographic images of the neck were obtained using MRA technique without and with intravenous contrast. CONTRAST:  17mL GADAVIST GADOBUTROL 1 MMOL/ML IV SOLN COMPARISON:  None. FINDINGS: MR HEAD FINDINGS Brain: No acute infarct, acute hemorrhage or extra-axial collection. Early confluent hyperintense T2-weighted signal of the periventricular and deep white matter.  Normal volume of CSF spaces. No chronic microhemorrhage. Normal midline structures. Vascular: Normal flow voids. Skull and upper cervical spine: Normal marrow signal. Sinuses/Orbits: Negative. Other: None. MR CIRCLE OF WILLIS FINDINGS POSTERIOR CIRCULATION: --Vertebral arteries: Normal --Inferior cerebellar arteries: Normal. --Basilar artery: Normal. --Superior cerebellar arteries: Normal. 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 Normal right. ANTERIOR CIRCULATION: --Intracranial internal carotid arteries: Normal. --Anterior cerebral arteries (ACA): Normal. --Middle cerebral arteries (MCA): Normal. ANATOMIC VARIANTS: None MRA NECK FINDINGS Normal carotid and vertebral arteries. IMPRESSION: 1. No acute intracranial abnormality. 2. 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 3. Normal MRA of the neck. Electronically Signed   By: Ulyses Jarred M.D.   On: 09/26/2020 02:56   ECHOCARDIOGRAM COMPLETE  Result Date: 09/26/2020  ECHOCARDIOGRAM REPORT   Patient Name:   Walter Cross Date of Exam: 09/26/2020 Medical Rec #:  [id number]          Height:       69.0 in Accession #:    [id number]         Weight:       78.4 lb Date of Birth:  Jan 15, 1951          BSA:          1.388 m Patient Age:    28 years           BP:           151/92 mmHg Patient Gender: M                  HR:           70 bpm. Exam Location:  [facility] Procedure: 2D Echo, Cardiac Doppler and Color Doppler Indications:    TIA 435.9 / G45.9  History:        Patient has prior history of Echocardiogram examinations, most                 recent 08/08/2012. Risk Factors:Hypertension and Dyslipidemia.                 Hypertrophic cardiomyopathy ([place]) (From Hx).  Sonographer:    Alvino Chapel RCS Referring Phys: [place]  1. Left ventricular ejection fraction, by estimation, is 60 to 65%. The left ventricle has normal function. The left ventricle has no regional wall motion abnormalities.  There is mild concentric left ventricular hypertrophy. Left ventricular diastolic parameters are indeterminate. Elevated left ventricular end-diastolic pressure.  2. Right ventricular systolic function is normal. The right ventricular size is normal. Tricuspid regurgitation signal is inadequate for assessing PA pressure.  3. The mitral valve is normal in structure. Trivial mitral valve regurgitation. No evidence of mitral stenosis.  4. The aortic valve is normal in structure. Aortic valve regurgitation is not visualized. No aortic stenosis is present.  5. The inferior vena cava is normal in size with greater than 50% respiratory variability, suggesting right atrial pressure of 3 mmHg. FINDINGS  Left Ventricle: Left ventricular ejection fraction, by estimation, is 60 to 65%. The left ventricle has normal function. The left ventricle has no regional wall motion abnormalities. The left ventricular internal cavity size was normal in size. There is  mild concentric left ventricular hypertrophy. Left ventricular diastolic parameters are indeterminate. Elevated left ventricular end-diastolic pressure. Right Ventricle: The right ventricular size is normal. No increase in right ventricular wall thickness. Right ventricular systolic function is normal. Tricuspid regurgitation signal is inadequate for assessing PA pressure. Left Atrium: Left atrial size was normal in size. Right Atrium: Right atrial size was normal in size. Pericardium: There is no evidence of pericardial effusion. Mitral Valve: The mitral valve is normal in structure. Trivial mitral valve regurgitation. No evidence of mitral valve stenosis. Tricuspid Valve: The tricuspid valve is normal in structure. Tricuspid valve regurgitation is trivial. No evidence of tricuspid stenosis. Aortic Valve: The aortic valve is normal in structure. Aortic valve regurgitation is not visualized. No aortic stenosis is present. Aortic valve mean gradient measures 4.0 mmHg. Aortic  valve peak gradient measures 8.0 mmHg. Pulmonic Valve: The pulmonic valve was normal in structure. Pulmonic valve regurgitation is not visualized. No evidence of pulmonic stenosis. Aorta: The aortic root is normal in size and structure. Venous: The inferior vena cava is normal  in size with greater than 50% respiratory variability, suggesting right atrial pressure of 3 mmHg. IAS/Shunts: No atrial level shunt detected by color flow Doppler.  LEFT VENTRICLE PLAX 2D LVIDd:         2.80 cm  Diastology LVIDs:         1.70 cm  LV e' medial:    6.53 cm/s LV PW:         1.20 cm  LV E/e' medial:  14.8 LV IVS:        1.30 cm  LV e' lateral:   7.07 cm/s LVOT diam:     2.00 cm  LV E/e' lateral: 13.7 LVOT Area:     3.14 cm  RIGHT VENTRICLE RV S prime:     12.50 cm/s TAPSE (M-mode): 1.8 cm LEFT ATRIUM             Index       RIGHT ATRIUM           Index LA diam:        3.40 cm 2.45 cm/m  RA Area:     12.10 cm LA Vol (A2C):   44.0 ml 31.71 ml/m RA Volume:   25.70 ml  18.52 ml/m LA Vol (A4C):   44.0 ml 31.71 ml/m LA Biplane Vol: 46.1 ml 33.22 ml/m  AORTIC VALVE AV Vmax:      141.00 cm/s AV Vmean:     86.800 cm/s AV VTI:       0.271 m AV Peak Grad: 8.0 mmHg AV Mean Grad: 4.0 mmHg  AORTA Ao Root diam: 3.10 cm MITRAL VALVE MV Area (PHT): 4.06 cm    SHUNTS MV Decel Time: 187 msec    Systemic Diam: 2.00 cm MV E velocity: 96.65 cm/s MV A velocity: 94.30 cm/s MV E/A ratio:  1.02 Fransico Him MD Electronically signed by Fransico Him MD Signature Date/Time: 09/26/2020/12:08:23 PM    Final    CT HEAD CODE STROKE WO CONTRAST  Result Date: 09/25/2020 CLINICAL DATA:  Code stroke.  Neuro deficit EXAM: CT HEAD WITHOUT CONTRAST TECHNIQUE: Contiguous axial images were obtained from the base of the skull through the vertex without intravenous contrast. COMPARISON:  None. FINDINGS: Brain: No acute infarct or intracranial hemorrhage. No mass lesion. No midline shift, ventriculomegaly or extra-axial fluid collection. Scattered and confluent  hypodense foci involving the periventricular and subcortical white matter nonspecific however commonly associated with chronic microvascular ischemic changes. Mild cerebral atrophy with ex vacuo dilatation. Vascular: No hyperdense vessel or unexpected calcification. Bilateral skull base atherosclerotic calcifications. Skull: Negative for fracture or focal lesion. Sinuses/Orbits: Normal orbits. Sequela of chronic right sphenoid sinus disease. Minimal left maxillary sinus mucosal thickening. Pneumatized mastoid air cells. Other: None. ASPECTS [facility] Stroke Program Early CT Score) - Ganglionic level infarction (caudate, lentiform nuclei, internal capsule, insula, M1-M3 cortex): 7 - Supraganglionic infarction (M4-M6 cortex): 3 Total score (0-10 with 10 being normal): 10 IMPRESSION: 1. No acute intracranial process. 2. ASPECTS is 10 3. Mild cerebral atrophy and chronic microvascular ischemic changes. 4. Code stroke imaging results were communicated on 09/25/2020 at 9:59 pm to provider Dr. Ernesta Amble via secure text paging. Electronically Signed   By: Primitivo Gauze M.D.   On: 09/25/2020 22:00     TODAY-DAY OF DISCHARGE:  Subjective:   Walter Cross today has no headache,no chest abdominal pain,no new weakness tingling or numbness, feels much better wants to go home today.   Objective:   Blood pressure 139/77, pulse 70, temperature 98.5 F (36.9 C),  temperature source Oral, resp. rate 14, height 5\' 9"  (1.753 m), weight 35.6 kg, SpO2 96 %. No intake or output data in the 24 hours ending 09/26/20 1352 Filed Weights   09/25/20 2216 09/26/20 0040  Weight: 82.9 kg 35.6 kg    Exam: Awake Alert, Oriented *3, No new F.N deficits, Normal affect Edwards.AT,PERRAL Supple Neck,No JVD, No cervical lymphadenopathy appriciated.  Symmetrical Chest wall movement, Good air movement bilaterally, CTAB RRR,No Gallops,Rubs or new Murmurs, No Parasternal Heave +ve B.Sounds, Abd Soft, Non tender, No organomegaly  appriciated, No rebound -guarding or rigidity. No Cyanosis, Clubbing or edema, No new Rash or bruise   PERTINENT RADIOLOGIC STUDIES: MR ANGIO HEAD WO CONTRAST  Result Date: 09/26/2020 CLINICAL DATA:  Transient ischemic attack EXAM: MR HEAD WITHOUT CONTRAST MR CIRCLE OF WILLIS WITHOUT CONTRAST MRA OF THE NECK WITHOUT AND WITH CONTRAST TECHNIQUE: Multiplanar, multiecho pulse sequences of the brain, circle of willis and surrounding structures were obtained without intravenous contrast. Angiographic images of the neck were obtained using MRA technique without and with intravenous contrast. CONTRAST:  4mL GADAVIST GADOBUTROL 1 MMOL/ML IV SOLN COMPARISON:  None. FINDINGS: MR HEAD FINDINGS Brain: No acute infarct, acute hemorrhage or extra-axial collection. Early confluent hyperintense T2-weighted signal of the periventricular and deep white matter. Normal volume of CSF spaces. No chronic microhemorrhage. Normal midline structures. Vascular: Normal flow voids. Skull and upper cervical spine: Normal marrow signal. Sinuses/Orbits: Negative. Other: None. MR CIRCLE OF WILLIS FINDINGS POSTERIOR CIRCULATION: --Vertebral arteries: Normal --Inferior cerebellar arteries: Normal. --Basilar artery: Normal. --Superior cerebellar arteries: Normal. 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 Normal right. ANTERIOR CIRCULATION: --Intracranial internal carotid arteries: Normal. --Anterior cerebral arteries (ACA): Normal. --Middle cerebral arteries (MCA): Normal. ANATOMIC VARIANTS: None MRA NECK FINDINGS Normal carotid and vertebral arteries. IMPRESSION: 1. No acute intracranial abnormality. 2. 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 3. Normal MRA of the neck. Electronically Signed   By: Ulyses Jarred M.D.   On: 09/26/2020 02:56   MR ANGIO NECK W WO CONTRAST  Result Date: 09/26/2020 CLINICAL DATA:  Transient ischemic attack EXAM: MR HEAD WITHOUT CONTRAST MR CIRCLE OF WILLIS WITHOUT  CONTRAST MRA OF THE NECK WITHOUT AND WITH CONTRAST TECHNIQUE: Multiplanar, multiecho pulse sequences of the brain, circle of willis and surrounding structures were obtained without intravenous contrast. Angiographic images of the neck were obtained using MRA technique without and with intravenous contrast. CONTRAST:  44mL GADAVIST GADOBUTROL 1 MMOL/ML IV SOLN COMPARISON:  None. FINDINGS: MR HEAD FINDINGS Brain: No acute infarct, acute hemorrhage or extra-axial collection. Early confluent hyperintense T2-weighted signal of the periventricular and deep white matter. Normal volume of CSF spaces. No chronic microhemorrhage. Normal midline structures. Vascular: Normal flow voids. Skull and upper cervical spine: Normal marrow signal. Sinuses/Orbits: Negative. Other: None. MR CIRCLE OF WILLIS FINDINGS POSTERIOR CIRCULATION: --Vertebral arteries: Normal --Inferior cerebellar arteries: Normal. --Basilar artery: Normal. --Superior cerebellar arteries: Normal. 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 Normal right. ANTERIOR CIRCULATION: --Intracranial internal carotid arteries: Normal. --Anterior cerebral arteries (ACA): Normal. --Middle cerebral arteries (MCA): Normal. ANATOMIC VARIANTS: None MRA NECK FINDINGS Normal carotid and vertebral arteries. IMPRESSION: 1. No acute intracranial abnormality. 2. 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 3. Normal MRA of the neck. Electronically Signed   By: Ulyses Jarred M.D.   On: 09/26/2020 02:56   MR BRAIN WO CONTRAST  Result Date: 09/26/2020 CLINICAL DATA:  Transient ischemic attack EXAM: MR HEAD WITHOUT CONTRAST MR CIRCLE OF WILLIS  WITHOUT CONTRAST MRA OF THE NECK WITHOUT AND WITH CONTRAST TECHNIQUE: Multiplanar, multiecho pulse sequences of the brain, circle of willis and surrounding structures were obtained without intravenous contrast. Angiographic images of the neck were obtained using MRA technique without and with intravenous  contrast. CONTRAST:  57mL GADAVIST GADOBUTROL 1 MMOL/ML IV SOLN COMPARISON:  None. FINDINGS: MR HEAD FINDINGS Brain: No acute infarct, acute hemorrhage or extra-axial collection. Early confluent hyperintense T2-weighted signal of the periventricular and deep white matter. Normal volume of CSF spaces. No chronic microhemorrhage. Normal midline structures. Vascular: Normal flow voids. Skull and upper cervical spine: Normal marrow signal. Sinuses/Orbits: Negative. Other: None. MR CIRCLE OF WILLIS FINDINGS POSTERIOR CIRCULATION: --Vertebral arteries: Normal --Inferior cerebellar arteries: Normal. --Basilar artery: Normal. --Superior cerebellar arteries: Normal. 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 Normal right. ANTERIOR CIRCULATION: --Intracranial internal carotid arteries: Normal. --Anterior cerebral arteries (ACA): Normal. --Middle cerebral arteries (MCA): Normal. ANATOMIC VARIANTS: None MRA NECK FINDINGS Normal carotid and vertebral arteries. IMPRESSION: 1. No acute intracranial abnormality. 2. 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 3. Normal MRA of the neck. Electronically Signed   By: Ulyses Jarred M.D.   On: 09/26/2020 02:56   ECHOCARDIOGRAM COMPLETE  Result Date: 09/26/2020    ECHOCARDIOGRAM REPORT   Patient Name:   Walter Cross Date of Exam: 09/26/2020 Medical Rec #:  [id number]          Height:       69.0 in Accession #:    [id number]         Weight:       78.4 lb Date of Birth:  1951/05/02          BSA:          1.388 m Patient Age:    29 years           BP:           151/92 mmHg Patient Gender: M                  HR:           70 bpm. Exam Location:  [facility] Procedure: 2D Echo, Cardiac Doppler and Color Doppler Indications:    TIA 435.9 / G45.9  History:        Patient has prior history of Echocardiogram examinations, most                 recent 08/08/2012. Risk Factors:Hypertension and Dyslipidemia.                 Hypertrophic cardiomyopathy  ([place]) (From Hx).  Sonographer:    Alvino Chapel RCS Referring Phys: [place]  1. Left ventricular ejection fraction, by estimation, is 60 to 65%. The left ventricle has normal function. The left ventricle has no regional wall motion abnormalities. There is mild concentric left ventricular hypertrophy. Left ventricular diastolic parameters are indeterminate. Elevated left ventricular end-diastolic pressure.  2. Right ventricular systolic function is normal. The right ventricular size is normal. Tricuspid regurgitation signal is inadequate for assessing PA pressure.  3. The mitral valve is normal in structure. Trivial mitral valve regurgitation. No evidence of mitral stenosis.  4. The aortic valve is normal in structure. Aortic valve regurgitation is not visualized. No aortic stenosis is present.  5. The inferior vena cava is normal in size with greater than 50% respiratory variability, suggesting right atrial pressure of 3 mmHg. FINDINGS  Left Ventricle: Left ventricular ejection  fraction, by estimation, is 60 to 65%. The left ventricle has normal function. The left ventricle has no regional wall motion abnormalities. The left ventricular internal cavity size was normal in size. There is  mild concentric left ventricular hypertrophy. Left ventricular diastolic parameters are indeterminate. Elevated left ventricular end-diastolic pressure. Right Ventricle: The right ventricular size is normal. No increase in right ventricular wall thickness. Right ventricular systolic function is normal. Tricuspid regurgitation signal is inadequate for assessing PA pressure. Left Atrium: Left atrial size was normal in size. Right Atrium: Right atrial size was normal in size. Pericardium: There is no evidence of pericardial effusion. Mitral Valve: The mitral valve is normal in structure. Trivial mitral valve regurgitation. No evidence of mitral valve stenosis. Tricuspid Valve: The tricuspid valve is normal in  structure. Tricuspid valve regurgitation is trivial. No evidence of tricuspid stenosis. Aortic Valve: The aortic valve is normal in structure. Aortic valve regurgitation is not visualized. No aortic stenosis is present. Aortic valve mean gradient measures 4.0 mmHg. Aortic valve peak gradient measures 8.0 mmHg. Pulmonic Valve: The pulmonic valve was normal in structure. Pulmonic valve regurgitation is not visualized. No evidence of pulmonic stenosis. Aorta: The aortic root is normal in size and structure. Venous: The inferior vena cava is normal in size with greater than 50% respiratory variability, suggesting right atrial pressure of 3 mmHg. IAS/Shunts: No atrial level shunt detected by color flow Doppler.  LEFT VENTRICLE PLAX 2D LVIDd:         2.80 cm  Diastology LVIDs:         1.70 cm  LV e' medial:    6.53 cm/s LV PW:         1.20 cm  LV E/e' medial:  14.8 LV IVS:        1.30 cm  LV e' lateral:   7.07 cm/s LVOT diam:     2.00 cm  LV E/e' lateral: 13.7 LVOT Area:     3.14 cm  RIGHT VENTRICLE RV S prime:     12.50 cm/s TAPSE (M-mode): 1.8 cm LEFT ATRIUM             Index       RIGHT ATRIUM           Index LA diam:        3.40 cm 2.45 cm/m  RA Area:     12.10 cm LA Vol (A2C):   44.0 ml 31.71 ml/m RA Volume:   25.70 ml  18.52 ml/m LA Vol (A4C):   44.0 ml 31.71 ml/m LA Biplane Vol: 46.1 ml 33.22 ml/m  AORTIC VALVE AV Vmax:      141.00 cm/s AV Vmean:     86.800 cm/s AV VTI:       0.271 m AV Peak Grad: 8.0 mmHg AV Mean Grad: 4.0 mmHg  AORTA Ao Root diam: 3.10 cm MITRAL VALVE MV Area (PHT): 4.06 cm    SHUNTS MV Decel Time: 187 msec    Systemic Diam: 2.00 cm MV E velocity: 96.65 cm/s MV A velocity: 94.30 cm/s MV E/A ratio:  1.02 Fransico Him MD Electronically signed by Fransico Him MD Signature Date/Time: 09/26/2020/12:08:23 PM    Final    CT HEAD CODE STROKE WO CONTRAST  Result Date: 09/25/2020 CLINICAL DATA:  Code stroke.  Neuro deficit EXAM: CT HEAD WITHOUT CONTRAST TECHNIQUE: Contiguous axial images were  obtained from the base of the skull through the vertex without intravenous contrast. COMPARISON:  None. FINDINGS: Brain: No acute infarct or  intracranial hemorrhage. No mass lesion. No midline shift, ventriculomegaly or extra-axial fluid collection. Scattered and confluent hypodense foci involving the periventricular and subcortical white matter nonspecific however commonly associated with chronic microvascular ischemic changes. Mild cerebral atrophy with ex vacuo dilatation. Vascular: No hyperdense vessel or unexpected calcification. Bilateral skull base atherosclerotic calcifications. Skull: Negative for fracture or focal lesion. Sinuses/Orbits: Normal orbits. Sequela of chronic right sphenoid sinus disease. Minimal left maxillary sinus mucosal thickening. Pneumatized mastoid air cells. Other: None. ASPECTS [facility] Stroke Program Early CT Score) - Ganglionic level infarction (caudate, lentiform nuclei, internal capsule, insula, M1-M3 cortex): 7 - Supraganglionic infarction (M4-M6 cortex): 3 Total score (0-10 with 10 being normal): 10 IMPRESSION: 1. No acute intracranial process. 2. ASPECTS is 10 3. Mild cerebral atrophy and chronic microvascular ischemic changes. 4. Code stroke imaging results were communicated on 09/25/2020 at 9:59 pm to provider Dr. Ernesta Amble via secure text paging. Electronically Signed   By: Primitivo Gauze M.D.   On: 09/25/2020 22:00     PERTINENT LAB RESULTS: CBC: Recent Labs    09/25/20 2149 09/25/20 2154  WBC 6.9  --   HGB 15.9 15.6  HCT 46.0 46.0  PLT 236  --    CMET CMP     Component Value Date/Time   NA 137 09/26/2020 0306   K 3.9 09/26/2020 0306   CL 103 09/26/2020 0306   CO2 23 09/26/2020 0306   GLUCOSE 125 (H) 09/26/2020 0306   BUN 8 09/26/2020 0306   CREATININE 0.76 09/26/2020 0306   CREATININE 0.77 04/01/2019 1110   CALCIUM 9.0 09/26/2020 0306   PROT 6.7 09/25/2020 2149   ALBUMIN 3.8 09/25/2020 2149   AST 22 09/25/2020 2149   ALT 17 09/25/2020 2149    ALKPHOS 64 09/25/2020 2149   BILITOT 0.8 09/25/2020 2149   GFRNONAA >60 09/26/2020 0306   GFRNONAA 93 04/01/2019 1110   GFRAA 107 04/01/2019 1110    GFR Estimated Creatinine Clearance: 43.3 mL/min (by C-G formula based on SCr of 0.76 mg/dL). No results for input(s): LIPASE, AMYLASE in the last 72 hours. No results for input(s): CKTOTAL, CKMB, CKMBINDEX, TROPONINI in the last 72 hours. Invalid input(s): POCBNP No results for input(s): DDIMER in the last 72 hours. Recent Labs    09/26/20 0306  HGBA1C 5.5   Recent Labs    09/26/20 0306  CHOL 177  HDL 65  LDLCALC 89  TRIG 114  CHOLHDL 2.7   No results for input(s): TSH, T4TOTAL, T3FREE, THYROIDAB in the last 72 hours.  Invalid input(s): FREET3 Recent Labs    09/26/20 0306  VITAMINB12 272  FOLATE 3.5*   Coags: Recent Labs    09/25/20 2149  INR 1.0   Microbiology: Recent Results (from the past 240 hour(s))  Respiratory Panel by RT PCR (Flu A&B, Covid) - Nasopharyngeal Swab     Status: None   Collection Time: 09/25/20 11:01 PM   Specimen: Nasopharyngeal Swab  Result Value Ref Range Status   SARS Coronavirus 2 by RT PCR NEGATIVE NEGATIVE Final    Comment: (NOTE) SARS-CoV-2 target nucleic acids are NOT DETECTED.  The SARS-CoV-2 RNA is generally detectable in upper respiratoy specimens during the acute phase of infection. The lowest concentration of SARS-CoV-2 viral copies this assay can detect is 131 copies/mL. A negative result does not preclude SARS-Cov-2 infection and should not be used as the sole basis for treatment or other patient management decisions. A negative result may occur with  improper specimen collection/handling, submission of specimen other than  nasopharyngeal swab, presence of viral mutation(s) within the areas targeted by this assay, and inadequate number of viral copies (<131 copies/mL). A negative result must be combined with clinical observations, patient history, and epidemiological  information. The expected result is Negative.  Fact Sheet for Patients:  PinkCheek.be  Fact Sheet for Healthcare Providers:  GravelBags.it  This test is no t yet approved or cleared by the [place] FDA and  has been authorized for detection and/or diagnosis of SARS-CoV-2 by FDA under an Emergency Use Authorization (EUA). This EUA will remain  in effect (meaning this test can be used) for the duration of the COVID-19 declaration under Section 564(b)(1) of the Act, 21 U.S.C. section 360bbb-3(b)(1), unless the authorization is terminated or revoked sooner.     Influenza A by PCR NEGATIVE NEGATIVE Final   Influenza B by PCR NEGATIVE NEGATIVE Final    Comment: (NOTE) The Xpert Xpress SARS-CoV-2/FLU/RSV assay is intended as an aid in  the diagnosis of influenza from Nasopharyngeal swab specimens and  should not be used as a sole basis for treatment. Nasal washings and  aspirates are unacceptable for Xpert Xpress SARS-CoV-2/FLU/RSV  testing.  Fact Sheet for Patients: PinkCheek.be  Fact Sheet for Healthcare Providers: GravelBags.it  This test is not yet approved or cleared by the [place] FDA and  has been authorized for detection and/or diagnosis of SARS-CoV-2 by  FDA under an Emergency Use Authorization (EUA). This EUA will remain  in effect (meaning this test can be used) for the duration of the  Covid-19 declaration under Section 564(b)(1) of the Act, 21  U.S.C. section 360bbb-3(b)(1), unless the authorization is  terminated or revoked. Performed at [facility], [place] [address], [place], [place] [address]     FURTHER DISCHARGE INSTRUCTIONS:  Get Medicines reviewed and adjusted: Please take all your medications with you for your next visit with your Primary MD  Laboratory/radiological data: Please request your Primary MD to go over all  hospital tests and procedure/radiological results at the follow up, please ask your Primary MD to get all Hospital records sent to his/her office.  In some cases, they will be blood work, cultures and biopsy results pending at the time of your discharge. Please request that your primary care M.D. goes through all the records of your hospital data and follows up on these results.  Also Note the following: If you experience worsening of your admission symptoms, develop shortness of breath, life threatening emergency, suicidal or homicidal thoughts you must seek medical attention immediately by calling 911 or calling your MD immediately  if symptoms less severe.  You must read complete instructions/literature along with all the possible adverse reactions/side effects for all the Medicines you take and that have been prescribed to you. Take any new Medicines after you have completely understood and accpet all the possible adverse reactions/side effects.   Do not drive when taking Pain medications or sleeping medications (Benzodaizepines)  Do not take more than prescribed Pain, Sleep and Anxiety Medications. It is not advisable to combine anxiety,sleep and pain medications without talking with your primary care practitioner  Special Instructions: If you have smoked or chewed Tobacco  in the last 2 yrs please stop smoking, stop any regular Alcohol  and or any Recreational drug use.  Wear Seat belts while driving.  Please note: You were cared for by a hospitalist during your hospital stay. Once you are discharged, your primary care physician will handle any further medical issues. Please note that  NO REFILLS for any discharge medications will be authorized once you are discharged, as it is imperative that you return to your primary care physician (or establish a relationship with a primary care physician if you do not have one) for your post hospital discharge needs so that they can reassess your need for  medications and monitor your lab values.  Total Time spent coordinating discharge including counseling, education and face to face time equals 35 minutes.  Signed: Phung Kotas 09/26/2020 1:52 PM

## 2020-09-26 NOTE — Progress Notes (Signed)
SLP Cancellation Note  Patient Details Name: WILHO SHARPLEY MRN: [id number] DOB: 1951/03/18   Cancelled treatment:       Reason Eval/Treat Not Completed: SLP screened, no needs identified, will sign off. MRI negative, brief symptoms resolved. No need for SLP assessment.    Jastin Fore, Katherene Ponto 09/26/2020, 8:12 AM

## 2020-09-28 ENCOUNTER — Other Ambulatory Visit: Payer: Self-pay | Admitting: *Deleted

## 2020-09-28 DIAGNOSIS — G459 Transient cerebral ischemic attack, unspecified: Secondary | ICD-10-CM

## 2020-09-28 DIAGNOSIS — I4891 Unspecified atrial fibrillation: Secondary | ICD-10-CM

## 2020-10-04 ENCOUNTER — Ambulatory Visit (INDEPENDENT_AMBULATORY_CARE_PROVIDER_SITE_OTHER): Payer: Medicare Other

## 2020-10-04 DIAGNOSIS — I4891 Unspecified atrial fibrillation: Secondary | ICD-10-CM | POA: Diagnosis not present

## 2020-10-04 DIAGNOSIS — G459 Transient cerebral ischemic attack, unspecified: Secondary | ICD-10-CM

## 2020-10-05 DIAGNOSIS — G5622 Lesion of ulnar nerve, left upper limb: Secondary | ICD-10-CM | POA: Diagnosis not present

## 2020-10-06 ENCOUNTER — Other Ambulatory Visit: Payer: Self-pay | Admitting: Family Medicine

## 2020-10-06 DIAGNOSIS — F321 Major depressive disorder, single episode, moderate: Secondary | ICD-10-CM

## 2020-10-07 NOTE — Telephone Encounter (Signed)
Please Advise

## 2020-10-27 ENCOUNTER — Other Ambulatory Visit: Payer: Self-pay

## 2020-10-27 ENCOUNTER — Ambulatory Visit (INDEPENDENT_AMBULATORY_CARE_PROVIDER_SITE_OTHER): Payer: Medicare Other | Admitting: Adult Health

## 2020-10-27 ENCOUNTER — Encounter: Payer: Self-pay | Admitting: Adult Health

## 2020-10-27 VITALS — BP 158/82 | HR 75 | Ht 68.5 in | Wt 177.0 lb

## 2020-10-27 DIAGNOSIS — G459 Transient cerebral ischemic attack, unspecified: Secondary | ICD-10-CM | POA: Diagnosis not present

## 2020-10-27 DIAGNOSIS — E785 Hyperlipidemia, unspecified: Secondary | ICD-10-CM | POA: Diagnosis not present

## 2020-10-27 DIAGNOSIS — I10 Essential (primary) hypertension: Secondary | ICD-10-CM | POA: Diagnosis not present

## 2020-10-27 DIAGNOSIS — E538 Deficiency of other specified B group vitamins: Secondary | ICD-10-CM

## 2020-10-27 MED ORDER — FOLIC ACID 1 MG PO TABS
2.0000 mg | ORAL_TABLET | Freq: Every day | ORAL | 3 refills | Status: DC
Start: 2020-10-27 — End: 2021-02-17

## 2020-10-27 NOTE — Progress Notes (Signed)
I agree with the above plan 

## 2020-10-27 NOTE — Progress Notes (Signed)
[facility] [address] [place]. [place] [address] [phone]       HOSPITAL FOLLOW UP NOTE  [name] Date of Birth:  January 23, 1951 Medical Record Number:  [id number]   Reason for Referral:  hospital stroke follow up    SUBJECTIVE:   CHIEF COMPLAINT:  Chief Complaint  Patient presents with  . Hospitalization Follow-up    rm 9  . Transient Ischemic Attack    pt has no new sx or concerns    HPI:   [name] is a 69 y.o. male with history of previous HTN, HLD, cardiomyopathy, and L4-5 surgery  who presented on 09/25/2020 with transient sudden right sided numbness and difficulty putting his glasses in his pocket.   Personally reviewed hospitalization pertinent progress notes, lab work and imaging with summary provided.  Stroke work-up largely unremarkable with MRI negative for acute abnormality and symptoms possibly TIA of unclear etiology.  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  Recommended 30-day cardiac event monitor outpatient to assess for atrial fibrillation.  Recommended DAPT for 3 months then Plavix alone.  HTN stable.  LDL 89 and initiate atorvastatin 40 mg daily. 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  Other stroke risk factors include advanced age, former tobacco use, EtOH use and family history of stroke and atrial fibrillation (brother).  Evaluated by therapy and discharged home in stable condition without therapy needs.  TIA - etiology unclear  CT Head -  No acute intracranial process.   MRI head - No acute intracranial abnormality.   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 Normal MRA of the neck.   2D Echo - EF 60 - 65%. No cardiac source of emboli identified.   30 day cardiac event monitoring as outpt to rule out afib  [employer] Virus 2 - negative  LDL - 89  HgbA1c - 5.5  UDS - negative  ETH - 176  VTE prophylaxis - Lovenox  aspirin 81 mg daily prior to  admission, now on aspirin 325 mg daily and clopidogrel 75 mg daily DAPT for 3 months and then plavix alone.  Patient counseled to be compliant with his antithrombotic medications  Ongoing aggressive stroke risk factor management  Therapy recommendations: none  Disposition:  home   Today, 10/27/2020, Walter Cross is being seen for hospital follow-up unaccompanied.  Stable since discharge without new or reoccurring symptoms. Denies residual deficits. Remains on DAPT with mild bruising but no bleeding.  Continues on atorvastatin without myalgias. Blood pressure today 158/82 which is patient's baseline. He is currently wearing heart monitor which will be completed at the end of this week.  At hospital discharge, he was advised to continue folic acid 1 mg x2 tabs daily only supplied with 30 tablets.  He is requesting a refill.  He also remains on B12 supplement.  No further concerns at this time    ROS:   14 system review of systems performed and negative with exception of no complaints  PMH:  Past Medical History:  Diagnosis Date  . Adenomatous colon polyp 2015    Buccini (q 5 yrs)   . Barrett's esophagus   . Benign colon polyp   . Depression   . Dermatitis    axillary  . 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 Genital HSV   . GERD (gastroesophageal reflux disease)   . Hx of cardiovascular stress test  ETT-Myoview 9/13: no ischemia, EF 81%  . Hyperlipidemia   . Hypertension   . Hypertrophic cardiomyopathy ([place])    a. Echo 2007: mod LVH, diast dysfn, mild MR, no LVOT obstruction;  b. echo 9/13: upper septal thickening, no SAM of MV, no LVOT gradient, mild LVH, EF 65%  . Vitamin D deficiency     PSH:  Past Surgical History:  Procedure Laterality Date  . CERVICAL SPINE SURGERY  [phone]   nerve release, Dr Hal Neer  . SPINE SURGERY     L4-L5, [phone]    Social History:  Social History   Socioeconomic History  . Marital status: Widowed    Spouse name: Not on  file  . Number of children: Not on file  . Years of education: Not on file  . Highest education level: Not on file  Occupational History  . Occupation: Patent examiner: [employer]  Tobacco Use  . Smoking status: Former Smoker    Packs/day: 1.00    Years: 7.00    Pack years: 7.00    Types: Cigarettes    Quit date: 11/29/1975    Years since quitting: 44.9  . Smokeless tobacco: Never Used  Vaping Use  . Vaping Use: Never used  Substance and Sexual Activity  . Alcohol use: Yes    Alcohol/week: 2.0 standard drinks    Types: 2 Standard drinks or equivalent per week  . Drug use: No  . Sexual activity: Not on file  Other Topics Concern  . Not on file  Social History Narrative  . Not on file   Social Determinants of Health   Financial Resource Strain:   . Difficulty of Paying Living Expenses: Not on file  Food Insecurity:   . Worried About Charity fundraiser in the Last Year: Not on file  . Ran Out of Food in the Last Year: Not on file  Transportation Needs:   . Lack of Transportation (Medical): Not on file  . Lack of Transportation (Non-Medical): Not on file  Physical Activity:   . Days of Exercise per Week: Not on file  . Minutes of Exercise per Session: Not on file  Stress:   . Feeling of Stress : Not on file  Social Connections:   . Frequency of Communication with Friends and Family: Not on file  . Frequency of Social Gatherings with Friends and Family: Not on file  . Attends Religious Services: Not on file  . Active Member of Clubs or Organizations: Not on file  . Attends Archivist Meetings: Not on file  . Marital Status: Not on file  Intimate Partner Violence:   . Fear of Current or Ex-Partner: Not on file  . Emotionally Abused: Not on file  . Physically Abused: Not on file  . Sexually Abused: Not on file    Family History:  Family History  Problem Relation Age of Onset  . Cancer Mother 82       cervical  .  Cancer Father        lung  . Stroke Brother   . Atrial fibrillation Other     Medications:   Current Outpatient Medications on File Prior to Visit  Medication Sig Dispense Refill  . acyclovir ointment (ZOVIRAX) 5 % Apply 1 application topically daily as needed (cold sores & herpes). 15 g 1  . aspirin EC 325 MG EC tablet Take 1 tablet (325 mg total) by mouth daily. 90 tablet 0  .  atorvastatin (LIPITOR) 40 MG tablet Take 1 tablet (40 mg total) by mouth daily. 30 tablet 0  . carvedilol (COREG) 12.5 MG tablet Take 1 tablet (12.5 mg total) by mouth 2 (two) times daily with a meal. 180 tablet 3  . clonazePAM (KLONOPIN) 0.5 MG tablet TAKE 1 TABLET(0.5 MG) BY MOUTH AT BEDTIME AS NEEDED FOR ANXIETY OR SLEEP 30 tablet 0  . clopidogrel (PLAVIX) 75 MG tablet Take 1 tablet (75 mg total) by mouth daily. 90 tablet 0  . Cyanocobalamin (VITAMIN B-12) 1000 MCG SUBL Place 1 tablet under the tongue daily.     Marland Kitchen diltiazem (CARTIA XT) 180 MG 24 hr capsule TAKE 1 CAPSULE(180 MG) BY MOUTH DAILY 90 capsule 3  . ibuprofen (ADVIL,MOTRIN) 200 MG tablet Take 200 mg by mouth every 4 (four) hours as needed for headache, mild pain or moderate pain.     Marland Kitchen nystatin-triamcinolone (MYCOLOG II) cream Apply 1 application topically as needed.    . pantoprazole (PROTONIX) 40 MG tablet Take 40 mg by mouth daily.    . ramipril (ALTACE) 10 MG capsule TAKE 1 CAPSULE(10 MG) BY MOUTH DAILY (Patient taking differently: Take 10 mg by mouth daily. ) 90 capsule 3  . sildenafil (REVATIO) 20 MG tablet 3-5 tablets daily as needed 50 tablet 6  . tamsulosin (FLOMAX) 0.4 MG CAPS capsule Take 0.4 mg by mouth at bedtime.    Marland Kitchen venlafaxine XR (EFFEXOR-XR) 150 MG 24 hr capsule TAKE 1 CAPSULE(150 MG) BY MOUTH DAILY WITH BREAKFAST (Patient taking differently: Take 150 mg by mouth daily with breakfast. ) 90 capsule 3  . vitamin B-12 1000 MCG tablet Take 1 tablet (1,000 mcg total) by mouth daily. 30 tablet 0  . predniSONE (DELTASONE) 20 MG tablet 3 tabs  poqday 1-2, 2 tabs poqday 3-4, 1 tab poqday 5-6 (Patient not taking: Reported on 10/27/2020) 12 tablet 0   No current facility-administered medications on file prior to visit.    Allergies:   Allergies  Allergen Reactions  . Esomeprazole Magnesium Other (See Comments)    cramps  . Septra [Bactrim] Other (See Comments)    Back ache  . Sulfamethoxazole-Trimethoprim     Other reaction(s): Other (See Comments) Back ache Back ache Back ache       OBJECTIVE:  Physical Exam  Vitals:   10/27/20 1401  BP: (!) 158/82  Pulse: 75  Weight: 177 lb (80.3 kg)  Height: 5' 8.5" (1.74 m)   Body mass index is 26.52 kg/m. No exam data present  Post stroke PHQ 2/9 Depression screen PHQ 2/9 10/27/2020  Decreased Interest 0  Down, Depressed, Hopeless 0  PHQ - 2 Score 0  Altered sleeping -  Tired, decreased energy -  Change in appetite -  Feeling bad or failure about yourself  -  Trouble concentrating -  Moving slowly or fidgety/restless -  Suicidal thoughts -  PHQ-9 Score -  Difficult doing work/chores -     General: well developed, well nourished,  very pleasant elderly Caucasian male, seated, in no evident distress Head: head normocephalic and atraumatic.   Neck: supple with no carotid or supraclavicular bruits Cardiovascular: regular rate and rhythm, no murmurs Musculoskeletal: no deformity Skin:  no rash/petichiae Vascular:  Normal pulses all extremities   Neurologic Exam Mental Status: Awake and fully alert.   Fluent speech and language.  Oriented to place and time. Recent and remote memory intact. Attention span, concentration and fund of knowledge appropriate. Mood and affect appropriate.  Cranial Nerves: Fundoscopic exam  reveals sharp disc margins. Pupils equal, briskly reactive to light. Extraocular movements full without nystagmus. Visual fields full to confrontation. Hearing intact. Facial sensation intact. Face, tongue, palate moves normally and symmetrically.   Motor: Normal bulk and tone. Normal strength in all tested extremity muscles. Sensory.: intact to touch , pinprick , position and vibratory sensation.  Coordination: Rapid alternating movements normal in all extremities. Finger-to-nose and heel-to-shin performed accurately bilaterally. Gait and Station: Arises from chair without difficulty. Stance is normal. Gait demonstrates normal stride length and balance without use of assistive device. Reflexes: 1+ and symmetric. Toes downgoing.     NIHSS  0 Modified Rankin  0      ASSESSMENT: Walter Cross is a 69 y.o. year old male presented with transient sudden right-sided numbness and difficulty putting his glasses in his pocket on 09/25/2020 likely in setting of TIA secondary to unclear source. Vascular risk factors include HTN, HLD, cardiomyopathy, FA deficiency, former tobacco use and EtOH use.      PLAN:  1. TIA :   a. Continue aspirin 325 mg daily and clopidogrel 75 mg daily  and atorvastatin for secondary stroke prevention.  Complete 3 months DAPT and then Plavix alone (approx 2 additional months). b. Complete 30-day cardiac event monitor to assess for possible atrial fibrillation c. Discussed secondary stroke prevention measures and importance of close PCP follow up for aggressive stroke risk factor management  2. HTN: BP goal <130/90.  Stable on high and which is patient's baseline.  Monitored routinely by cardiology. 3. HLD: LDL goal <70. Recent LDL 89.  Initiated atorvastatin 40 mg daily during recent admission.  Advised continuation and follow-up with PCP in the next 1 to 2 months for repeat lipid panel as well as ongoing prescribing of statin 4. FA deficiency: Refill placed for folic acid 1mg  x2 tabs daily. Plans on f/u with PCP in the next 1 to 2 months for repeat folate and B12 level check    Follow up in 4 months or call earlier if needed   CC:  [facility] provider: Dr. Elissa Hefty, Cammie Mcgee, MD    I spent 45 minutes  of face-to-face and non-face-to-face time with patient.  This included previsit chart review including recent hospitalization pertinent progress notes, lab work and imaging, lab review, study review, order entry, electronic health record documentation, patient education regarding recent stroke and possible etiology, indication for cardiac monitoring, indication for ongoing use of folic acid with FA deficiency, importance of managing stroke risk factors and answered all other questions to patient satisfaction   Frann Rider, AGNP-BC  [facility] [address] [place], [place] [address]  Phone [phone] Fax [phone] Note: This document was prepared with digital dictation and possible smart phrase technology. Any transcriptional errors that result from this process are unintentional.

## 2020-10-27 NOTE — Patient Instructions (Addendum)
Continue aspirin 325 mg daily and clopidogrel 75 mg daily  and atorvastatin  for secondary stroke prevention After 2 additional months, you can stop aspirin and continue on plavix alone  Sent in refill for folic acid - please ensure follow up visit with Dr. Dennard Schaumann for repeat folate and B12 level  Continue to follow up with PCP regarding cholesterol and blood pressure management  Maintain strict control of hypertension with blood pressure goal below 130/90 and cholesterol with LDL cholesterol (bad cholesterol) goal below 70 mg/dL.       Followup in the future with me in 4 months or call earlier if needed       Thank you for coming to see [place] at [facility]. I hope we have been able to provide you high quality care today.  You may receive a patient satisfaction survey over the next few weeks. We would appreciate your feedback and comments so that we may continue to improve ourselves and the health of our patients.     Transient Ischemic Attack  A transient ischemic attack (TIA) is a "warning stroke" that causes stroke-like symptoms that go away quickly. A TIA does not cause lasting damage to the brain. But having a TIA is a sign that you may be at risk for a stroke. Lifestyle changes and medical treatments can help prevent a stroke. It is important to know the symptoms of a TIA and what to do. Get help right away, even if your symptoms go away. The symptoms of a TIA are the same as those of a stroke. They can happen fast, and they usually go away within minutes or hours. They can include:  Weakness or loss of feeling in your face, arm, or leg. This often happens on one side of your body.  Trouble walking.  Trouble moving your arms or legs.  Trouble talking or understanding what people are saying.  Trouble seeing.  Seeing two of one object (double vision).  Feeling dizzy.  Feeling confused.  Loss of balance or coordination.  Feeling sick to your  stomach (nauseous) and throwing up (vomiting).  A very bad headache for no reason. What increases the risk? Certain things may make you more likely to have a TIA. Some of these are things that you can change, such as:  Being very overweight (obese).  Using products that contain nicotine or tobacco, such as cigarettes and e-cigarettes.  Taking birth control pills.  Not being active.  Drinking too much alcohol.  Using drugs. Other risk factors include:  Having an irregular heartbeat (atrial fibrillation).  Being African American or Hispanic.  Having had blood clots, stroke, TIA, or heart attack in the past.  Being a woman with a history of high blood pressure in pregnancy (preeclampsia).  Being over the age of 32.  Being male.  Having family history of stroke.  Having the following diseases or conditions: ? High blood pressure. ? High cholesterol. ? Diabetes. ? Heart disease. ? Sickle cell disease. ? Sleep apnea. ? Migraine headache. ? Long-term (chronic) diseases that cause soreness and swelling (inflammation). ? Disorders that affect how your blood clots. Follow these instructions at home: Medicines   Take over-the-counter and prescription medicines only as told by your doctor.  If you were told to take aspirin or another medicine to thin your blood, take it exactly as told by your doctor. ? Taking too much of the medicine can cause bleeding. ? Taking too little of the medicine may not work  to treat the problem. Eating and drinking   Eat 5 or more servings of fruits and vegetables each day.  Follow instructions from your doctor about your diet. You may need to follow a certain diet to help lower your risk of having a stroke. You may need to: ? Eat a diet that is low in fat and salt. ? Eat foods that contain a lot of fiber. ? Limit the amount of carbohydrates and sugar in your diet.  Limit alcohol intake to 1 drink a day for nonpregnant women and 2 drinks  a day for men. One drink equals 12 oz of beer, 5 oz of wine, or 1 oz of hard liquor. General instructions  Keep a healthy weight.  Stay active. Try to get at least 30 minutes of activity on all or most days.  Find out if you have a condition called sleep apnea. Get treatment if needed.  Do not use any products that contain nicotine or tobacco, such as cigarettes and e-cigarettes. If you need help quitting, ask your doctor.  Do not abuse drugs.  Keep all follow-up visits as told by your doctor. This is important. Get help right away if:  You have any signs of stroke. "BE FAST" is an easy way to remember the main warning signs: ? B - Balance. Signs are dizziness, sudden trouble walking, or loss of balance. ? E - Eyes. Signs are trouble seeing or a sudden change in how you see. ? F - Face. Signs are sudden weakness or loss of feeling of the face, or the face or eyelid drooping on one side. ? A - Arms. Signs are weakness or loss of feeling in an arm. This happens suddenly and usually on one side of the body. ? S - Speech. Signs are sudden trouble speaking, slurred speech, or trouble understanding what people say. ? T - Time. Time to call emergency services. Write down what time symptoms started.  You have other signs of stroke, such as: ? A sudden, very bad headache with no known cause. ? Feeling sick to your stomach (nausea). ? Throwing up (vomiting). ? Jerky movements that you cannot control (seizure). These symptoms may be an emergency. Do not wait to see if the symptoms will go away. Get medical help right away. Call your local emergency services (911 in the [place] Do not drive yourself to the hospital. Summary  A transient ischemic attack (TIA) is a "warning stroke" that causes stroke-like symptoms that go away quickly.  A TIA is a medical emergency. Get help right away, even if your symptoms go away.  A TIA does not cause lasting damage to the brain.  Having a TIA is a sign  that you may be at risk for a stroke. Lifestyle changes and medical treatments can help prevent a stroke. This information is not intended to replace advice given to you by your health care provider. Make sure you discuss any questions you have with your health care provider. Document Revised: 08/10/2018 Document Reviewed: 02/15/2017 Elsevier Patient Education  [place].      Vitamin B12 and Folate Test  Why am I having this test? Vitamin U76 and folate (folic acid) are both B vitamins that are needed to make red blood cells and to keep your nervous system healthy. Vitamin B12 is found in foods such as meats, eggs, dairy products, and fish. Folate is found in fruits, beans, and leafy green vegetables. These vitamins also get added to some foods,  such as grains and cereals. You may have a lack (deficiency) of these B vitamins in your body if you do not get enough of them in your diet. Low levels can also be caused by digestive system diseases that interfere with your ability to absorb the vitamins from your food. The most common cause of a vitamin B12 deficiency is the inability to absorb it (pernicious anemia). You may have a vitamin B12 and folate test if:  You have symptoms of vitamin B12 or folate deficiency, such as fatigue, headache, confusion, poor balance, or tingling and numbness.  You are pregnant or breastfeeding. Women who are pregnant or breastfeeding need more folate and may need to take supplements.  Your red blood cell count is low (anemia).  You are an older person and have mental confusion.  You have a disease or condition that may lead to a deficiency of these B vitamins. What is being tested? This test measures the amount of vitamin B12 and folate in your blood. The tests for vitamin B12 and folate may be done together or separately. What kind of sample is taken?  A blood sample is required for this test. It is usually collected by inserting a needle into a  blood vessel. How do I prepare for this test? Follow instructions from your health care provider about eating and drinking before the test. Tell a health care provider about:  All medicines you are taking, including vitamins, herbs, eye drops, creams, and over-the-counter medicines.  Any medical conditions you have.  Whether you are pregnant or may be pregnant.  How often you drink alcohol. How are the results reported? Your test results will be reported as values that identify the amount of vitamin B12 and folate in your blood. Your health care provider will compare your results to normal ranges that were established after testing a large group of people (reference ranges). Reference ranges may vary among labs and hospitals. For this test, common reference ranges are:  Vitamin B12: 160-950 pg/mL or 118-701 pmol/L (SI units).  Folate: 5-25 ng/mL or 11-57 nmol/L (SI units). What do the results mean? Results within the reference range are considered normal. Vitamin B12 or folate levels that are lower than the reference range may be caused by various conditions, including:  Anemia.  Poor nutrition.  Alcoholism.  Liver disease.  Digestive disease. High levels of vitamin B12 are rare, but they may happen if you have:  Cancer.  Diabetes.  Heart failure.  Obesity.  Liver disease.  Human immunodeficiency virus (HIV). High levels of folate may happen if:  You have pernicious anemia.  You are vegetarian.  You have had a recent blood transfusion. Talk with your health care provider about what your results mean. Questions to ask your health care provider Ask your health care provider, or the department that is doing the test:  When will my results be ready?  How will I get my results?  What are my treatment options?  What other tests do I need?  What are my next steps? Summary  Vitamin Y78 and folate (folic acid) are both B vitamins that are needed to make red  blood cells and to keep your nervous system healthy.  You may have a lack (deficiency) of these B vitamins in your body if you do not get enough of them in your diet or if you have a digestive system disease.  This test measures the amount of vitamin B12 and folate in your blood. A blood  sample is required for the test.  Talk with your health care provider about what your results mean. This information is not intended to replace advice given to you by your health care provider. Make sure you discuss any questions you have with your health care provider. Document Revised: 06/14/2019 Document Reviewed: 07/10/2017 Elsevier Patient Education  [place].

## 2020-11-11 DIAGNOSIS — G5602 Carpal tunnel syndrome, left upper limb: Secondary | ICD-10-CM | POA: Diagnosis not present

## 2020-11-11 DIAGNOSIS — G5622 Lesion of ulnar nerve, left upper limb: Secondary | ICD-10-CM | POA: Diagnosis not present

## 2020-12-02 ENCOUNTER — Telehealth: Payer: Self-pay | Admitting: Family Medicine

## 2020-12-02 MED ORDER — PANTOPRAZOLE SODIUM 40 MG PO TBEC: 40.0000 mg | DELAYED_RELEASE_TABLET | Freq: Every day | ORAL | 1 refills | Status: AC

## 2020-12-02 NOTE — Telephone Encounter (Signed)
Prescription sent to pharmacy for Protonix.   Patient reports that he is supposed to have endoscopy/ ablation for Barrett's Esophagitis. States that it cannot be scheduled without  PCP recommendations on holding ASA/ Plavix.   Please advise.

## 2020-12-02 NOTE — Telephone Encounter (Signed)
Patient called in concerned about when to stop Plavix before his surgery, he is also requesting a refill on his Protonix   cb [phone]

## 2020-12-03 NOTE — Telephone Encounter (Signed)
Patient can hold aspirin and plavix 1 week prior to endoscopy.

## 2020-12-03 NOTE — Telephone Encounter (Signed)
Patient requires letter to send to [facility]. Also inquired as to when he can resume medications after endoscopy.   Please advise.

## 2020-12-04 ENCOUNTER — Ambulatory Visit (INDEPENDENT_AMBULATORY_CARE_PROVIDER_SITE_OTHER): Payer: Medicare Other | Admitting: Cardiovascular Disease

## 2020-12-04 ENCOUNTER — Encounter: Payer: Self-pay | Admitting: Cardiovascular Disease

## 2020-12-04 ENCOUNTER — Other Ambulatory Visit: Payer: Self-pay

## 2020-12-04 ENCOUNTER — Encounter: Payer: Self-pay | Admitting: Family Medicine

## 2020-12-04 VITALS — BP 116/72 | HR 70 | Ht 68.5 in | Wt 176.0 lb

## 2020-12-04 DIAGNOSIS — E782 Mixed hyperlipidemia: Secondary | ICD-10-CM | POA: Diagnosis not present

## 2020-12-04 DIAGNOSIS — G459 Transient cerebral ischemic attack, unspecified: Secondary | ICD-10-CM | POA: Diagnosis not present

## 2020-12-04 DIAGNOSIS — I10 Essential (primary) hypertension: Secondary | ICD-10-CM | POA: Diagnosis not present

## 2020-12-04 NOTE — Telephone Encounter (Signed)
Letter is printed and on my desk.

## 2020-12-04 NOTE — Patient Instructions (Signed)
Medication Instructions:  No changes *If you need a refill on your cardiac medications before your next appointment, please call your pharmacy*   Lab Work: Today: lipids, liver function and bmet  If you have labs (blood work) drawn today and your tests are completely normal, you will receive your results only by: Marland Kitchen MyChart Message (if you have MyChart) OR . A paper copy in the mail If you have any lab test that is abnormal or we need to change your treatment, we will call you to review the results.   Testing/Procedures: none   Follow-Up: At [facility], you and your health needs are our priority.  As part of our continuing mission to provide you with exceptional heart care, we have created designated Provider Care Teams.  These Care Teams include your primary Cardiologist (physician) and Advanced Practice Providers (APPs -  Physician Assistants and Nurse Practitioners) who all work together to provide you with the care you need, when you need it.   Your next appointment:   6 month(s)  The format for your next appointment:   In Person  Provider:   You will see one of the following Advanced Practice Providers on your designated Care Team:    Richardson Dopp, PA-C  Robbie Lis, PA-C  Then, [name], MD will plan to see you again in 12 month(s).   Other Instructions

## 2020-12-04 NOTE — Progress Notes (Signed)
Walter Cross Date of Birth  [birth year] [facility] [id number]    [place], [place]  [address] [phone]  Fax  [phone]   70 year old gentleman with a history of a mild LVH. He is done very well on medical therapy. He's been on carvedilol and Cardizem. He does not have any episodes of chest pain or shortness breath. He exercises occasionally.  He's looking forward to getting up and walking work this summer and spring.  Apr 23, 2013:  Walter Cross presents with some episodes of chest pain last year. He had a stress Myoview study which was normal. He's had a recent echocardiogram which did not reveal any LVOT gradient  [name] placed him on an acid blocker and he is feeling well.    Nov. 24, 2014:    he was having some CP the last time I saw him.    A stress Myoview study which revealed no evidence of ischemia. He had normal left ventricular systolic function with an ejection fraction of 81%.  His CP have resolved after being started her on an acid blocker by Dr. [name].  Dec. 1, 2015:  Walter Cross is doing well. No cardiac issues.  No further GERD issues.   Admits that he could use a bit more exercise.  Dec. 1, 2016:  Doing great.   Exercising well.  Has vivid dreams - he thinks its due to the coreg   Dec. 8, 2017:  Doing well.   BP is slightly elevated but typically is normal . Avoids salt .    Not exercising much .    No CP or dyspnea.    Dec. 12, 2018: Doing well .  No CP ,.  Asymptomatic   November 08, 2018: Seen today for follow-up of his  HTN and LVH   He remains asymptomatic. Overall is doing well  Not Exercising as much  No CP or dyspnea  Has some dizziness on occasion   Jan. 7, 2021 Walter Cross is seen today for follow up of his HTN and mild LVH on echo  Has some DOE  Not exercising regularly .  Still working on his 2002 .    Jan. 7, 2022: Walter Cross is seen today for follow up of his HTN and his mild LVH on echo  Had a TIA in  Nov. Had numbness of his right arm,  Then leg numbness. Lasted several minutes Event monitor showed no afib. CT scan was normal Brain MRI  - showed a small left sided lesion  Is now in ASA 325 mg  Instead of 81  LDL was found to be 89.  He was started on atorvastatin.  He wants to stop it  MRA of the neck was normal.  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    Current Outpatient Medications on File Prior to Visit  Medication Sig Dispense Refill  . acyclovir ointment (ZOVIRAX) 5 % Apply 1 application topically daily as needed (cold sores & herpes). 15 g 1  . aspirin EC 325 MG EC tablet Take 1 tablet (325 mg total) by mouth daily. 90 tablet 0  . atorvastatin (LIPITOR) 40 MG tablet Take 1 tablet (40 mg total) by mouth daily. 30 tablet 0  . carvedilol (COREG) 12.5 MG tablet Take 1 tablet (12.5 mg total) by mouth 2 (two) times daily with a meal. 180 tablet 3  . Cholecalciferol (VITAMIN D) 50 MCG (2000 UT) CAPS Take 1 capsule by mouth daily in  the afternoon.    . clonazePAM (KLONOPIN) 0.5 MG tablet TAKE 1 TABLET(0.5 MG) BY MOUTH AT BEDTIME AS NEEDED FOR ANXIETY OR SLEEP 30 tablet 0  . clopidogrel (PLAVIX) 75 MG tablet Take 1 tablet (75 mg total) by mouth daily. 90 tablet 0  . Cyanocobalamin (VITAMIN B-12) 1000 MCG SUBL Place 1 tablet under the tongue daily.     Marland Kitchen diltiazem (CARTIA XT) 180 MG 24 hr capsule TAKE 1 CAPSULE(180 MG) BY MOUTH DAILY 90 capsule 3  . folic acid (FOLVITE) 1 MG tablet Take 2 tablets (2 mg total) by mouth daily. 60 tablet 3  . ibuprofen (ADVIL,MOTRIN) 200 MG tablet Take 200 mg by mouth every 4 (four) hours as needed for headache, mild pain or moderate pain.     Marland Kitchen nystatin-triamcinolone (MYCOLOG II) cream Apply 1 application topically as needed.    . pantoprazole (PROTONIX) 40 MG tablet Take 1 tablet (40 mg total) by mouth daily. 90 tablet 1  . ramipril (ALTACE) 10 MG capsule TAKE 1 CAPSULE(10 MG) BY MOUTH DAILY 90 capsule 3  . sildenafil (REVATIO) 20 MG  tablet 3-5 tablets daily as needed 50 tablet 6  . tamsulosin (FLOMAX) 0.4 MG CAPS capsule Take 0.4 mg by mouth at bedtime.    Marland Kitchen venlafaxine XR (EFFEXOR-XR) 150 MG 24 hr capsule TAKE 1 CAPSULE(150 MG) BY MOUTH DAILY WITH BREAKFAST 90 capsule 3   No current facility-administered medications on file prior to visit.    Allergies  Allergen Reactions  . Esomeprazole Magnesium Other (See Comments)    cramps  . Septra [Bactrim] Other (See Comments)    Back ache  . Sulfamethoxazole-Trimethoprim     Other reaction(s): Other (See Comments) Back ache Back ache Back ache     Past Medical History:  Diagnosis Date  . Adenomatous colon polyp 2015    Buccini (q 5 yrs)   . Barrett's esophagus   . Benign colon polyp   . Depression   . Dermatitis    axillary  . 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 Genital HSV   . GERD (gastroesophageal reflux disease)   . Hx of cardiovascular stress test    ETT-Myoview 9/13: no ischemia, EF 81%  . Hyperlipidemia   . Hypertension   . Hypertrophic cardiomyopathy ([place])    a. Echo 2007: mod LVH, diast dysfn, mild MR, no LVOT obstruction;  b. echo 9/13: upper septal thickening, no SAM of MV, no LVOT gradient, mild LVH, EF 65%  . Vitamin D deficiency     Past Surgical History:  Procedure Laterality Date  . CERVICAL SPINE SURGERY  [phone]   nerve release, Dr Hal Neer  . SPINE SURGERY     L4-L5, [phone]    Social History   Tobacco Use  Smoking Status Former Smoker  . Packs/day: 1.00  . Years: 7.00  . Pack years: 7.00  . Types: Cigarettes  . Quit date: 11/29/1975  . Years since quitting: 45.0  Smokeless Tobacco Never Used    Social History   Substance and Sexual Activity  Alcohol Use Yes  . Alcohol/week: 2.0 standard drinks  . Types: 2 Standard drinks or equivalent per week    Family History  Problem Relation Age of Onset  . Cancer Mother 92       cervical  . Cancer Father        lung  . Stroke Brother   . Atrial  fibrillation Other     Reviw of Systems:  Reviewed  in the HPI.  All other systems are negative.   Physical Exam: Blood pressure 116/72, pulse 70, height 5' 8.5" (1.74 m), weight 176 lb (79.8 kg), SpO2 97 %.  GEN:  Well nourished, well developed in no acute distress HEENT: Normal NECK: No JVD; No carotid bruits LYMPHATICS: No lymphadenopathy CARDIAC: RRR , no murmurs, rubs, gallops RESPIRATORY:  Clear to auscultation without rales, wheezing or rhonchi  ABDOMEN: Soft, non-tender, non-distended MUSCULOSKELETAL:  No edema; No deformity  SKIN: Warm and dry NEUROLOGIC:  Alert and oriented x 3  EKG:     Assessment / Plan:   1.  HTN:    BP is well controlled.      2.  TIA: He had a TIA several months ago.  He was found to have a tight stenosis in the proximal aspect of his left P2.  He was started on atorvastatin.  His LDL was 89.  We will check labs today.  His goal LDL is less than 70.  2.   hyperlipidemia: Continue atorvastatin.  His goal is less than 70.   [name], MD  12/04/2020 2:54 PM    [place],  [place], [place]  [address] Pager [phone] Phone: [phone]; Fax: [phone]

## 2020-12-04 NOTE — Telephone Encounter (Signed)
Letter faxed to : Mortimer Fries, MD  [address]  [id number] [facility] [address]  [place], [place] [address]  Phone: [phone]  Fax: [phone]

## 2020-12-05 LAB — LIPID PANEL
Chol/HDL Ratio: 2.9 ratio (ref 0.0–5.0)
Cholesterol, Total: 219 mg/dL — ABNORMAL HIGH (ref 100–199)
HDL: 76 mg/dL (ref >39)
LDL Chol Calc (NIH): 120 mg/dL — ABNORMAL HIGH (ref 0–99)
Triglycerides: 132 mg/dL (ref 0–149)
VLDL Cholesterol Cal: 23 mg/dL (ref 5–40)

## 2020-12-05 LAB — HEPATIC FUNCTION PANEL
ALT: 22 IU/L (ref 0–44)
AST: 22 IU/L (ref 0–40)
Albumin: 4.3 g/dL (ref 3.8–4.8)
Alkaline Phosphatase: 90 IU/L (ref 44–121)
Bilirubin Total: 0.8 mg/dL (ref 0.0–1.2)
Bilirubin, Direct: 0.27 mg/dL (ref 0.00–0.40)
Total Protein: 7 g/dL (ref 6.0–8.5)

## 2020-12-05 LAB — BASIC METABOLIC PANEL
BUN/Creatinine Ratio: 10 (ref 10–24)
BUN: 9 mg/dL (ref 8–27)
CO2: 25 mmol/L (ref 20–29)
Calcium: 9.3 mg/dL (ref 8.6–10.2)
Chloride: 96 mmol/L (ref 96–106)
Creatinine, Ser: 0.89 mg/dL (ref 0.76–1.27)
GFR calc Af Amer: 100 mL/min/{1.73_m2} (ref >59)
GFR calc non Af Amer: 87 mL/min/{1.73_m2} (ref >59)
Glucose: 126 mg/dL — ABNORMAL HIGH (ref 65–99)
Potassium: 4.8 mmol/L (ref 3.5–5.2)
Sodium: 136 mmol/L (ref 134–144)

## 2020-12-08 ENCOUNTER — Telehealth: Payer: Self-pay | Admitting: *Deleted

## 2020-12-08 DIAGNOSIS — E782 Mixed hyperlipidemia: Secondary | ICD-10-CM

## 2020-12-08 MED ORDER — ATORVASTATIN CALCIUM 40 MG PO TABS
40.0000 mg | ORAL_TABLET | Freq: Every day | ORAL | 3 refills | Status: DC
Start: 2020-12-08 — End: 2021-09-09

## 2020-12-08 NOTE — Telephone Encounter (Signed)
See recommendation by neurology service regarding plavix.

## 2020-12-08 NOTE — Telephone Encounter (Signed)
The patient has not taken the atorvastatin in about 3 weeks stating the Doctor ordered it in the hospital and he has not be able to get a refill.  Will order atorvastatin and schedule lab work.  The patient is aware and agreeable.

## 2020-12-08 NOTE — Telephone Encounter (Signed)
   Primary Cardiologist: [name], MD  Chart reviewed as part of pre-operative protocol coverage. Patient was contacted 12/08/2020 in reference to pre-operative risk assessment for pending surgery as outlined below.  Walter Cross was last seen on 12/04/2020 by Dr. Acie Fredrickson.  Since that day, RONEN BROMWELL has done well without chest pain or shortness of breath.  Therefore, based on ACC/AHA guidelines, the patient would be at acceptable risk for the planned procedure without further cardiovascular testing.   The patient was advised that if he develops new symptoms prior to surgery to contact our office to arrange for a follow-up visit, and he verbalized understanding.  I will route this recommendation to the requesting party via Epic fax function and remove from pre-op pool. Please call with questions.  I will forward to Neurology service to make sure he is able to hold Plavix for 5 days since he is not on Plavix due to cardiac reason, he was placed on Plavix in October 2021 due to TIA.  [place], [place] 12/08/2020, 3:23 PM

## 2020-12-08 NOTE — Telephone Encounter (Signed)
   [facility] Pre-operative Risk Assessment    HEARTCARE STAFF: - Please ensure there is not already an duplicate clearance open for this procedure. - Under Visit Info/Reason for Call, type in Other and utilize the format Clearance MM/DD/YY or Clearance TBD. Do not use dashes or single digits. - If request is for dental extraction, please clarify the # of teeth to be extracted.  Request for surgical clearance:  1. What type of surgery is being performed? EGD   2. When is this surgery scheduled? TBD   3. What type of clearance is required (medical clearance vs. Pharmacy clearance to hold med vs. Both)? MEDICAL  4. Are there any medications that need to be held prior to surgery and how long? PLAVIX x 5 DAYS PRIOR; PT IS ALSO ON ASA THOUGH THIS IS NOT LISTED ON THE CLEARANCE REQUEST AS NEEDING TO BE HELD    5. Practice name and name of physician performing surgery? [facility]   6. What is the office phone number? [phone]   7.   What is the office fax number? [phone]  8.   Anesthesia type (None, local, MAC, general) ? NOT LISTED   Julaine Hua 12/08/2020, 12:30 PM  _________________________________________________________________   (provider comments below)

## 2020-12-08 NOTE — Telephone Encounter (Signed)
It is typically recommended to wait 3 to 6 months after a stroke/TIA for any type of elective procedure that requires holding aspirin and Plavix.  In Walter Cross case, he was to remain on DAPT for total of 3 months which would place him at the end of January and then single agent alone.  If this is just a routine screening I would recommend holding off at least until he is 6 months out from his TIA.  If this is more of an emergent need colonoscopy, blood thinners can be held but with increased risk of stroke at this timeframe especially with known intracranial stenosis.

## 2020-12-08 NOTE — Telephone Encounter (Signed)
-----   Message from Thayer Headings, MD sent at 12/07/2020  5:01 PM EST ----- He has been on Atorvastatin for several months.   If he has been consistent with taking this for 2 months, then its clear that the atorvastatin is not strong enough and we should change him to Rosuvastatin 10 mg a day .  Recheck lipids and liver enz in 3 months   If he is only taking the medication occasionally, we should encourage him to take the atorvastatin regularly and recheck lipids, liver enz in 3 months.

## 2020-12-15 ENCOUNTER — Other Ambulatory Visit: Payer: Self-pay | Admitting: Cardiovascular Disease

## 2020-12-16 ENCOUNTER — Other Ambulatory Visit: Payer: Self-pay

## 2020-12-16 ENCOUNTER — Encounter: Payer: Self-pay | Admitting: Family Medicine

## 2020-12-16 DIAGNOSIS — I421 Obstructive hypertrophic cardiomyopathy: Secondary | ICD-10-CM

## 2020-12-16 DIAGNOSIS — I10 Essential (primary) hypertension: Secondary | ICD-10-CM

## 2020-12-16 MED ORDER — DILTIAZEM HCL ER COATED BEADS 180 MG PO CP24: ORAL_CAPSULE | ORAL | 3 refills | Status: DC

## 2020-12-16 MED ORDER — CLOPIDOGREL BISULFATE 75 MG PO TABS
75.0000 mg | ORAL_TABLET | Freq: Every day | ORAL | 0 refills | Status: DC
Start: 2020-12-16 — End: 2021-03-30

## 2021-01-02 ENCOUNTER — Other Ambulatory Visit: Payer: Self-pay | Admitting: Family Medicine

## 2021-01-07 ENCOUNTER — Encounter: Payer: Self-pay | Admitting: Family Medicine

## 2021-01-08 MED ORDER — PREDNISONE 20 MG PO TABS: ORAL_TABLET | ORAL | 0 refills | Status: DC

## 2021-01-12 ENCOUNTER — Ambulatory Visit (INDEPENDENT_AMBULATORY_CARE_PROVIDER_SITE_OTHER): Payer: Medicare Other | Admitting: Family Medicine

## 2021-01-12 ENCOUNTER — Other Ambulatory Visit: Payer: Self-pay

## 2021-01-12 VITALS — BP 134/80 | HR 70 | Temp 97.8°F | Resp 15 | Ht 68.5 in | Wt 177.0 lb

## 2021-01-12 DIAGNOSIS — I10 Essential (primary) hypertension: Secondary | ICD-10-CM | POA: Diagnosis not present

## 2021-01-12 DIAGNOSIS — Z8673 Personal history of transient ischemic attack (TIA), and cerebral infarction without residual deficits: Secondary | ICD-10-CM

## 2021-01-12 DIAGNOSIS — E7800 Pure hypercholesterolemia, unspecified: Secondary | ICD-10-CM

## 2021-01-12 NOTE — Progress Notes (Signed)
Subjective:    Patient ID: Walter Cross, male    DOB: 10/07/1951, 70 y.o.   MRN: [id number]  HPI Since I last saw the patient, he was admitted to the hospital with a TIA in October.  He was eating dinner with his brother when his right face became numb.  He was taken directly to the hospital.  Upon arrival to the hospital, his symptoms gradually improved.  Neurology was consulted in the hospital and recommended a high intensity statin with a goal LDL cholesterol of less than 70.  They recommended dual antiplatelet therapy including aspirin 325 mg daily and Plavix 75 mg daily for 3 months and then Plavix alone 75 mg daily.  He is here today for follow-up.  He is still on dual antiplatelet therapy.  He recently saw cardiology.  His event monitor was negative for any atrial fibrillation.  There was no evidence of a cardioembolic source.  However his LDL cholesterol on his labs checked in January was significantly elevated.  The patient admits he had not been consistently taking his Lipitor.  He has since started the medication consistently on a daily basis.  He is due to recheck the cholesterol in April as ordered by his cardiologist.  He continues to drink a couple beers a day and 2 or 3 glasses of wine.  I asked the patient to reduce his alcohol to 2 glasses of wine or less. Immunization History  Administered Date(s) Administered  . Fluad Quad(high Dose 65+) 11/05/2019, 09/16/2020  . Influenza, High Dose Seasonal PF 09/05/2018  . Influenza,inj,Quad PF,6+ Mos 09/20/2013, 11/19/2014, 09/17/2015, 09/13/2016, 11/03/2017  . Pneumococcal Conjugate-13 03/18/2016  . Pneumococcal Polysaccharide-23 03/17/2015  . Tdap 02/16/2011    Past Surgical History:  Procedure Laterality Date  . CERVICAL SPINE SURGERY  [phone]   nerve release, Dr Hal Neer  . SPINE SURGERY     L4-L5, [phone]   Current Outpatient Medications on File Prior to Visit  Medication Sig Dispense Refill  . acyclovir ointment  (ZOVIRAX) 5 % Apply 1 application topically daily as needed (cold sores & herpes). 15 g 1  . aspirin EC 325 MG EC tablet Take 1 tablet (325 mg total) by mouth daily. 90 tablet 0  . atorvastatin (LIPITOR) 40 MG tablet Take 1 tablet (40 mg total) by mouth daily. 90 tablet 3  . carvedilol (COREG) 12.5 MG tablet TAKE 1 TABLET(12.5 MG) BY MOUTH TWICE DAILY WITH A MEAL 180 tablet 3  . Cholecalciferol (VITAMIN D) 50 MCG (2000 UT) CAPS Take 1 capsule by mouth daily in the afternoon.    . clonazePAM (KLONOPIN) 0.5 MG tablet TAKE 1 TABLET(0.5 MG) BY MOUTH AT BEDTIME AS NEEDED FOR ANXIETY OR SLEEP 30 tablet 0  . clopidogrel (PLAVIX) 75 MG tablet Take 1 tablet (75 mg total) by mouth daily. 90 tablet 0  . Cyanocobalamin (VITAMIN B-12) 1000 MCG SUBL Place 1 tablet under the tongue daily.     Marland Kitchen diltiazem (CARTIA XT) 180 MG 24 hr capsule TAKE 1 CAPSULE(180 MG) BY MOUTH DAILY 90 capsule 3  . folic acid (FOLVITE) 1 MG tablet Take 2 tablets (2 mg total) by mouth daily. 60 tablet 3  . ibuprofen (ADVIL,MOTRIN) 200 MG tablet Take 200 mg by mouth every 4 (four) hours as needed for headache, mild pain or moderate pain.     Marland Kitchen nystatin-triamcinolone (MYCOLOG II) cream Apply 1 application topically as needed.    . pantoprazole (PROTONIX) 40 MG tablet Take 1 tablet (40 mg total) by  mouth daily. 90 tablet 1  . predniSONE (DELTASONE) 20 MG tablet 3 tabs poqday 1-2, 2 tabs poqday 3-4, 1 tab poqday 5-6 12 tablet 0  . ramipril (ALTACE) 10 MG capsule TAKE 1 CAPSULE(10 MG) BY MOUTH DAILY 90 capsule 3  . sildenafil (REVATIO) 20 MG tablet 3-5 tablets daily as needed 50 tablet 6  . tamsulosin (FLOMAX) 0.4 MG CAPS capsule Take 0.4 mg by mouth at bedtime.    Marland Kitchen venlafaxine XR (EFFEXOR-XR) 150 MG 24 hr capsule TAKE 1 CAPSULE(150 MG) BY MOUTH DAILY WITH BREAKFAST 90 capsule 3   No current facility-administered medications on file prior to visit.   Allergies  Allergen Reactions  . Esomeprazole Magnesium Other (See Comments)    cramps   . Septra [Bactrim] Other (See Comments)    Back ache  . Sulfamethoxazole-Trimethoprim     Other reaction(s): Other (See Comments) Back ache Back ache Back ache    Social History   Socioeconomic History  . Marital status: Widowed    Spouse name: Not on file  . Number of children: Not on file  . Years of education: Not on file  . Highest education level: Not on file  Occupational History  . Occupation: Patent examiner: [employer]  Tobacco Use  . Smoking status: Former Smoker    Packs/day: 1.00    Years: 7.00    Pack years: 7.00    Types: Cigarettes    Quit date: 11/29/1975    Years since quitting: 45.1  . Smokeless tobacco: Never Used  Vaping Use  . Vaping Use: Never used  Substance and Sexual Activity  . Alcohol use: Yes    Alcohol/week: 2.0 standard drinks    Types: 2 Standard drinks or equivalent per week  . Drug use: No  . Sexual activity: Not on file  Other Topics Concern  . Not on file  Social History Narrative  . Not on file   Social Determinants of Health   Financial Resource Strain: Not on file  Food Insecurity: Not on file  Transportation Needs: Not on file  Physical Activity: Not on file  Stress: Not on file  Social Connections: Not on file  Intimate Partner Violence: Not on file   Family History  Problem Relation Age of Onset  . Cancer Mother 24       cervical  . Cancer Father        lung  . Stroke Brother   . Atrial fibrillation Other     .p   Review of Systems  All other systems reviewed and are negative.      Objective:   Physical Exam Vitals reviewed.  Constitutional:      General: He is not in acute distress.    Appearance: He is well-developed. He is not diaphoretic.  HENT:     Head: Normocephalic and atraumatic.     Right Ear: External ear normal.     Left Ear: External ear normal.     Nose: Nose normal.     Mouth/Throat:     Pharynx: No oropharyngeal exudate.  Eyes:     General: No  scleral icterus.       Right eye: No discharge.        Left eye: No discharge.     Conjunctiva/sclera: Conjunctivae normal.     Pupils: Pupils are equal, round, and reactive to light.  Neck:     Thyroid: No thyromegaly.     Vascular: No JVD.  Trachea: No tracheal deviation.  Cardiovascular:     Rate and Rhythm: Normal rate and regular rhythm.     Heart sounds: Normal heart sounds. No murmur heard. No friction rub. No gallop.   Pulmonary:     Effort: Pulmonary effort is normal. No respiratory distress.     Breath sounds: Normal breath sounds. No stridor. No wheezing or rales.  Chest:     Chest wall: No tenderness.  Abdominal:     General: Bowel sounds are normal. There is no distension.     Palpations: Abdomen is soft. There is no mass.     Tenderness: There is no abdominal tenderness. There is no guarding or rebound.  Genitourinary:    Prostate: Normal.     Rectum: Normal.  Musculoskeletal:        General: No tenderness. Normal range of motion.     Cervical back: Normal range of motion and neck supple.  Lymphadenopathy:     Cervical: No cervical adenopathy.  Skin:    General: Skin is warm.     Coloration: Skin is not pale.     Findings: No erythema or rash.  Neurological:     Mental Status: He is alert and oriented to person, place, and time.     Cranial Nerves: No cranial nerve deficit.     Motor: No abnormal muscle tone.     Coordination: Coordination normal.     Deep Tendon Reflexes: Reflexes are normal and symmetric.  Psychiatric:        Behavior: Behavior normal.        Thought Content: Thought content normal.        Judgment: Judgment normal.           Assessment & Plan:  History of TIA (transient ischemic attack)  Essential hypertension  Pure hypercholesterolemia  Blood pressure today is acceptable.  Patient is on dual antiplatelet therapy but he has completed 3 months of therapy.  Therefore I recommended that he switch to Plavix 75 mg daily alone  and discontinue the aspirin as recommended by his neurologist.  I encouraged the patient to continue to take his Lipitor on a daily basis.  We will need to check a fasting lipid panel in April.  He can check this either here or with his cardiologist.  I would like his LDL cholesterol to be less than 70 and is close to 50 as possible.  I would like his fasting blood sugar to be under 100.  He believes that he was not fasting when they checked his blood work in January.  Recommended reduction of alcohol to less than 2 glasses of wine a day

## 2021-01-25 ENCOUNTER — Other Ambulatory Visit: Payer: Self-pay | Admitting: Family Medicine

## 2021-01-25 DIAGNOSIS — F321 Major depressive disorder, single episode, moderate: Secondary | ICD-10-CM

## 2021-01-25 NOTE — Telephone Encounter (Signed)
Ok to refill??  Last office visit 01/12/2021.  Last refill 10/08/2020.

## 2021-02-12 NOTE — Telephone Encounter (Signed)
Our office received clearance request updated that the pt's procedure is now 04/05/21, see original clearance that was put in 12/08/20. I will send this to pre op pool for any further recommendations.

## 2021-02-12 NOTE — Telephone Encounter (Signed)
   Primary Cardiologist: [name], MD  Chart reviewed as part of pre-operative protocol coverage. Patient was contacted 02/12/2021 in reference to pre-operative risk assessment for pending surgery as outlined below.  Walter Cross was last seen on 12/04/20 by Dr. Acie Fredrickson.  Since that day, Walter Cross has done well from a cardiac standpoint. He can complete 4 METs without anginal complaints  Therefore, based on ACC/AHA guidelines, the patient would be at acceptable risk for the planned procedure without further cardiovascular testing.   The patient was advised that if he develops new symptoms prior to surgery to contact our office to arrange for a follow-up visit, and he verbalized understanding.  Plavix is managed by PCP/neurology, therefore will defer recommendations to them for holding plavix prior to his endoscopy.  I will route this recommendation to the requesting party via Epic fax function and remove from pre-op pool. Please call with questions.  Abigail Butts, PA-C 02/12/2021, 3:52 PM

## 2021-02-17 ENCOUNTER — Other Ambulatory Visit: Payer: Self-pay | Admitting: *Deleted

## 2021-02-17 MED ORDER — FOLIC ACID 1 MG PO TABS
2.0000 mg | ORAL_TABLET | Freq: Every day | ORAL | 3 refills | Status: DC
Start: 2021-02-17 — End: 2021-05-18

## 2021-02-24 ENCOUNTER — Encounter: Payer: Self-pay | Admitting: Adult Health

## 2021-02-24 ENCOUNTER — Ambulatory Visit (INDEPENDENT_AMBULATORY_CARE_PROVIDER_SITE_OTHER): Payer: Medicare Other | Admitting: Adult Health

## 2021-02-24 VITALS — BP 147/84 | HR 79 | Ht 69.0 in | Wt 176.0 lb

## 2021-02-24 DIAGNOSIS — G459 Transient cerebral ischemic attack, unspecified: Secondary | ICD-10-CM

## 2021-02-24 DIAGNOSIS — E538 Deficiency of other specified B group vitamins: Secondary | ICD-10-CM | POA: Diagnosis not present

## 2021-02-24 NOTE — Progress Notes (Signed)
[facility] [address] [place]. [place] [address] [phone]       STROKE FOLLOW UP NOTE  [name] Date of Birth:  08-19-1951 Medical Record Number:  [id number]   Reason for Referral: stroke follow up    SUBJECTIVE:   CHIEF COMPLAINT:  Chief Complaint  Patient presents with  . Follow-up    RM 14 alone Pt is well, no complications, no complaints     HPI:   Today, 02/24/2021, [name] returns for 36-month TIA follow-up unaccompanied  Stable since prior visit without new or recurring stroke/TIA symptoms Completed 3 months DAPT and remains on Plavix alone -denies side effects Compliant on atorvastatin 40 mg daily -denies associated side effects Blood pressure today 147/84 -monitors at home and typically 140-150s/80s Lipid panel 12/04/2020 LDL 120 but apparently ran out of atorvastatin for a few weeks prior to lab work - plans on repeating in April Cardiac event monitor negative for arrhythmia or A. fib  Continues on folic acid and N98 supplement for B12 and folate deficiency - has not had repeat levels since starting X21 and folic acid  No new concerns at this time    History provided for reference purposes only Initial visit 10/27/2020 JM: Walter Cross is being seen for hospital follow-up unaccompanied.  Stable since discharge without new or reoccurring symptoms. Denies residual deficits. Remains on DAPT with mild bruising but no bleeding.  Continues on atorvastatin without myalgias. Blood pressure today 158/82 which is patient's baseline. He is currently wearing heart monitor which will be completed at the end of this week.  At hospital discharge, he was advised to continue folic acid 1 mg x2 tabs daily only supplied with 30 tablets.  He is requesting a refill.  He also remains on B12 supplement.  No further concerns at this time  Stroke admission 09/25/2020 [name] is a 70 y.o. male with history of previous HTN, HLD,  cardiomyopathy, and L4-5 surgery  who presented on 09/25/2020 with transient sudden right sided numbness and difficulty putting his glasses in his pocket.   Personally reviewed hospitalization pertinent progress notes, lab work and imaging with summary provided.  Stroke work-up largely unremarkable with MRI negative for acute abnormality and symptoms possibly TIA of unclear etiology.  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  Recommended 30-day cardiac event monitor outpatient to assess for atrial fibrillation.  Recommended DAPT for 3 months then Plavix alone.  HTN stable.  LDL 89 and initiate atorvastatin 40 mg daily. 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  Other stroke risk factors include advanced age, former tobacco use, EtOH use and family history of stroke and atrial fibrillation (brother).  Evaluated by therapy and discharged home in stable condition without therapy needs.  TIA - etiology unclear  CT Head -  No acute intracranial process.   MRI head - No acute intracranial abnormality.   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 Normal MRA of the neck.   2D Echo - EF 60 - 65%. No cardiac source of emboli identified.   30 day cardiac event monitoring as outpt to rule out afib  [employer] Virus 2 - negative  LDL - 89  HgbA1c - 5.5  UDS - negative  ETH - 176  VTE prophylaxis - Lovenox  aspirin 81 mg daily prior to admission, now on aspirin 325 mg daily and clopidogrel 75 mg daily DAPT for 3 months and then plavix  alone.  Patient counseled to be compliant with his antithrombotic medications  Ongoing aggressive stroke risk factor management  Therapy recommendations: none  Disposition:  home      ROS:   14 system review of systems performed and negative with exception of no complaints  PMH:  Past Medical History:  Diagnosis Date  . Adenomatous colon polyp 2015    Buccini (q 5 yrs)   . Barrett's esophagus   . Benign colon  polyp   . Depression   . Dermatitis    axillary  . 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 Genital HSV   . GERD (gastroesophageal reflux disease)   . Hx of cardiovascular stress test    ETT-Myoview 9/13: no ischemia, EF 81%  . Hyperlipidemia   . Hypertension   . Hypertrophic cardiomyopathy ([place])    a. Echo 2007: mod LVH, diast dysfn, mild MR, no LVOT obstruction;  b. echo 9/13: upper septal thickening, no SAM of MV, no LVOT gradient, mild LVH, EF 65%  . Vitamin D deficiency     PSH:  Past Surgical History:  Procedure Laterality Date  . CERVICAL SPINE SURGERY  [phone]   nerve release, Dr Hal Neer  . SPINE SURGERY     L4-L5, [phone]    Social History:  Social History   Socioeconomic History  . Marital status: Widowed    Spouse name: Not on file  . Number of children: Not on file  . Years of education: Not on file  . Highest education level: Not on file  Occupational History  . Occupation: Patent examiner: [employer]  Tobacco Use  . Smoking status: Former Smoker    Packs/day: 1.00    Years: 7.00    Pack years: 7.00    Types: Cigarettes    Quit date: 11/29/1975    Years since quitting: 45.2  . Smokeless tobacco: Never Used  Vaping Use  . Vaping Use: Never used  Substance and Sexual Activity  . Alcohol use: Yes    Alcohol/week: 2.0 standard drinks    Types: 2 Standard drinks or equivalent per week  . Drug use: No  . Sexual activity: Not on file  Other Topics Concern  . Not on file  Social History Narrative  . Not on file   Social Determinants of Health   Financial Resource Strain: Not on file  Food Insecurity: Not on file  Transportation Needs: Not on file  Physical Activity: Not on file  Stress: Not on file  Social Connections: Not on file  Intimate Partner Violence: Not on file    Family History:  Family History  Problem Relation Age of Onset  . Cancer Mother 64       cervical  . Cancer Father         lung  . Stroke Brother   . Atrial fibrillation Other     Medications:   Current Outpatient Medications on File Prior to Visit  Medication Sig Dispense Refill  . acyclovir ointment (ZOVIRAX) 5 % Apply 1 application topically daily as needed (cold sores & herpes). 15 g 1  . atorvastatin (LIPITOR) 40 MG tablet Take 1 tablet (40 mg total) by mouth daily. 90 tablet 3  . carvedilol (COREG) 12.5 MG tablet TAKE 1 TABLET(12.5 MG) BY MOUTH TWICE DAILY WITH A MEAL 180 tablet 3  . Cholecalciferol (VITAMIN D) 50 MCG (2000 UT) CAPS Take 1 capsule by mouth daily in the afternoon.    Marland Kitchen  clonazePAM (KLONOPIN) 0.5 MG tablet TAKE 1 TABLET(0.5 MG) BY MOUTH AT BEDTIME AS NEEDED FOR ANXIETY OR SLEEP 30 tablet 0  . clopidogrel (PLAVIX) 75 MG tablet Take 1 tablet (75 mg total) by mouth daily. 90 tablet 0  . Cyanocobalamin (VITAMIN B-12) 1000 MCG SUBL Place 1 tablet under the tongue daily.     Marland Kitchen diltiazem (CARTIA XT) 180 MG 24 hr capsule TAKE 1 CAPSULE(180 MG) BY MOUTH DAILY 90 capsule 3  . folic acid (FOLVITE) 1 MG tablet Take 2 tablets (2 mg total) by mouth daily. 60 tablet 3  . ibuprofen (ADVIL,MOTRIN) 200 MG tablet Take 200 mg by mouth every 4 (four) hours as needed for headache, mild pain or moderate pain.     Marland Kitchen nystatin-triamcinolone (MYCOLOG II) cream Apply 1 application topically as needed.    . pantoprazole (PROTONIX) 40 MG tablet Take 1 tablet (40 mg total) by mouth daily. 90 tablet 1  . predniSONE (DELTASONE) 20 MG tablet 3 tabs poqday 1-2, 2 tabs poqday 3-4, 1 tab poqday 5-6 12 tablet 0  . ramipril (ALTACE) 10 MG capsule TAKE 1 CAPSULE(10 MG) BY MOUTH DAILY 90 capsule 3  . sildenafil (REVATIO) 20 MG tablet 3-5 tablets daily as needed 50 tablet 6  . tamsulosin (FLOMAX) 0.4 MG CAPS capsule Take 0.4 mg by mouth at bedtime.    Marland Kitchen venlafaxine XR (EFFEXOR-XR) 150 MG 24 hr capsule TAKE 1 CAPSULE(150 MG) BY MOUTH DAILY WITH BREAKFAST 90 capsule 3   No current facility-administered medications on file prior to  visit.    Allergies:   Allergies  Allergen Reactions  . Esomeprazole Magnesium Other (See Comments)    cramps  . Septra [Bactrim] Other (See Comments)    Back ache  . Sulfamethoxazole-Trimethoprim     Other reaction(s): Other (See Comments) Back ache Back ache Back ache       OBJECTIVE:  Physical Exam  Vitals:   02/24/21 1532  BP: (!) 147/84  Pulse: 79  Weight: 176 lb (79.8 kg)  Height: 5\' 9"  (1.753 m)   Body mass index is 25.99 kg/m. No exam data present  General: well developed, well nourished, very pleasant elderly Caucasian male, seated, in no evident distress Head: head normocephalic and atraumatic.   Neck: supple with no carotid or supraclavicular bruits Cardiovascular: regular rate and rhythm, no murmurs Musculoskeletal: no deformity Skin:  no rash/petichiae Vascular:  Normal pulses all extremities   Neurologic Exam Mental Status: Awake and fully alert.   Fluent speech and language.  Oriented to place and time. Recent and remote memory intact. Attention span, concentration and fund of knowledge appropriate. Mood and affect appropriate.  Cranial Nerves: Pupils equal, briskly reactive to light. Extraocular movements full without nystagmus. Visual fields full to confrontation. Hearing intact. Facial sensation intact. Face, tongue, palate moves normally and symmetrically.  Motor: Normal bulk and tone. Normal strength in all tested extremity muscles. Sensory.: intact to touch , pinprick , position and vibratory sensation.  Coordination: Rapid alternating movements normal in all extremities. Finger-to-nose and heel-to-shin performed accurately bilaterally. Gait and Station: Arises from chair without difficulty. Stance is normal. Gait demonstrates normal stride length and balance without use of assistive device. Reflexes: 1+ and symmetric. Toes downgoing.         ASSESSMENT: Walter Cross is a 70 y.o. year old male presented with transient sudden  right-sided numbness and difficulty putting his glasses in his pocket on 09/25/2020 likely in setting of TIA secondary to unclear source. Vascular risk factors  include HTN, HLD, cardiomyopathy, FA deficiency, former tobacco use and EtOH use.      PLAN:  1. TIA :   a. Stable without new stroke/TIA symptoms b. Continue clopidogrel 75 mg daily  and atorvastatin for secondary stroke prevention.   c. Cardiac monitor negative for atrial fibrillation d. Discussed secondary stroke prevention measures and importance of close PCP follow up for aggressive stroke risk factor management  2. HTN: BP goal <130/90.  Stable on high and which is patient's baseline.  Monitored routinely by cardiology. 3. HLD: LDL goal <70.  On atorvastatin 40 mg daily - scheduled for repeat lipid panel 4/11 4. B12 and FA deficiency: Continue folic acid and Z02 supplement - provided lab reqs to obtain B12 and folate level on 4/11 when he is scheduled to get his other lab work completed    Follow up in 6 months or call earlier if needed   CC:  [place] provider: Dr. Elissa Hefty, Cammie Mcgee, MD    I spent 30 minutes of face-to-face and non-face-to-face time with patient.  This included previsit chart review, study review, order entry, electronic health record documentation, patient education regarding prior TIA, indication for ongoing use of folic acid with FA deficiency and b12 for b12 deficiency, importance of managing stroke risk factors and answered all other questions to patient satisfaction  Frann Rider, AGNP-BC  [facility] [address] [place], [place] [address]  Phone [phone] Fax [phone] Note: This document was prepared with digital dictation and possible smart phrase technology. Any transcriptional errors that result from this process are unintentional.

## 2021-02-24 NOTE — Patient Instructions (Signed)
Continue current treatment plan - no changes recommended today  Request b12 and folate level be obtained with your other labs on 4/11 - if you have any difficulty having these completed when you go for lab work, please let me know  Continue clopidogrel 75 mg daily  and atorvastatin 40 mg daily for secondary stroke prevention  Continue to follow up with PCP regarding cholesterol and blood pressure management  Maintain strict control of hypertension with blood pressure goal below 130/90 and cholesterol with LDL cholesterol (bad cholesterol) goal below 70 mg/dL.       Followup in the future with me in 6 months or call earlier if needed       Thank you for coming to see [place] at [facility]. I hope we have been able to provide you high quality care today.  You may receive a patient satisfaction survey over the next few weeks. We would appreciate your feedback and comments so that we may continue to improve ourselves and the health of our patients.

## 2021-02-25 NOTE — Progress Notes (Signed)
I agree with the above plan 

## 2021-03-08 ENCOUNTER — Other Ambulatory Visit: Payer: Medicare Other | Admitting: *Deleted

## 2021-03-08 ENCOUNTER — Other Ambulatory Visit: Payer: Self-pay

## 2021-03-08 ENCOUNTER — Other Ambulatory Visit: Payer: Self-pay | Admitting: Adult Health

## 2021-03-08 DIAGNOSIS — E538 Deficiency of other specified B group vitamins: Secondary | ICD-10-CM | POA: Diagnosis not present

## 2021-03-08 DIAGNOSIS — E782 Mixed hyperlipidemia: Secondary | ICD-10-CM

## 2021-03-08 LAB — HEPATIC FUNCTION PANEL
ALT: 18 IU/L (ref 0–44)
AST: 18 IU/L (ref 0–40)
Albumin: 4.4 g/dL (ref 3.7–4.7)
Alkaline Phosphatase: 91 IU/L (ref 44–121)
Bilirubin Total: 0.5 mg/dL (ref 0.0–1.2)
Bilirubin, Direct: 0.2 mg/dL (ref 0.00–0.40)
Total Protein: 6.6 g/dL (ref 6.0–8.5)

## 2021-03-08 LAB — LIPID PANEL
Chol/HDL Ratio: 1.7 ratio (ref 0.0–5.0)
Cholesterol, Total: 135 mg/dL (ref 100–199)
HDL: 80 mg/dL (ref >39)
LDL Chol Calc (NIH): 36 mg/dL (ref 0–99)
Triglycerides: 109 mg/dL (ref 0–149)
VLDL Cholesterol Cal: 19 mg/dL (ref 5–40)

## 2021-03-12 LAB — B12 AND FOLATE PANEL
Folate: >20 ng/mL (ref >3.0)
Vitamin B-12: 309 pg/mL (ref 232–1245)

## 2021-03-13 ENCOUNTER — Other Ambulatory Visit: Payer: Self-pay | Admitting: Family Medicine

## 2021-03-15 ENCOUNTER — Other Ambulatory Visit: Payer: Self-pay | Admitting: Family Medicine

## 2021-03-15 ENCOUNTER — Telehealth: Payer: Self-pay

## 2021-03-15 ENCOUNTER — Encounter: Payer: Self-pay | Admitting: Family Medicine

## 2021-03-15 MED ORDER — PREDNISONE 20 MG PO TABS: ORAL_TABLET | ORAL | 0 refills | Status: DC

## 2021-03-15 NOTE — Telephone Encounter (Signed)
Contacted pt regarding lab results, informed him that his B12 level is within normal limits but remains on the lower side - advised to follow-up with his PCP for further supplementation options such as possibly increasing dosage or monthly IM injections. Encouraged him to call the office with further questions, he understood.

## 2021-03-29 ENCOUNTER — Other Ambulatory Visit: Payer: Self-pay | Admitting: Family Medicine

## 2021-03-29 ENCOUNTER — Encounter: Payer: Self-pay | Admitting: Family Medicine

## 2021-04-01 ENCOUNTER — Ambulatory Visit: Payer: Medicare Other

## 2021-04-05 DIAGNOSIS — K219 Gastro-esophageal reflux disease without esophagitis: Secondary | ICD-10-CM | POA: Diagnosis not present

## 2021-04-05 DIAGNOSIS — Z888 Allergy status to other drugs, medicaments and biological substances status: Secondary | ICD-10-CM | POA: Diagnosis not present

## 2021-04-05 DIAGNOSIS — K449 Diaphragmatic hernia without obstruction or gangrene: Secondary | ICD-10-CM | POA: Diagnosis not present

## 2021-04-05 DIAGNOSIS — Z882 Allergy status to sulfonamides status: Secondary | ICD-10-CM | POA: Diagnosis not present

## 2021-04-05 DIAGNOSIS — F32A Depression, unspecified: Secondary | ICD-10-CM | POA: Diagnosis not present

## 2021-04-05 DIAGNOSIS — I10 Essential (primary) hypertension: Secondary | ICD-10-CM | POA: Diagnosis not present

## 2021-04-05 DIAGNOSIS — K22711 Barrett's esophagus with high grade dysplasia: Secondary | ICD-10-CM | POA: Diagnosis not present

## 2021-04-05 DIAGNOSIS — E785 Hyperlipidemia, unspecified: Secondary | ICD-10-CM | POA: Diagnosis not present

## 2021-04-08 NOTE — Progress Notes (Signed)
Subjective:   Walter Cross is a 70 y.o. male who presents for Medicare Annual/Subsequent preventive examination.  Review of Systems    N/A  Cardiac Risk Factors include: advanced age (>47men, >59 women);male gender;dyslipidemia;hypertension     Objective:    Today's Vitals   04/09/21 1537  BP: 128/64  Pulse: 72  Temp: 98.3 F (36.8 C)  TempSrc: Oral  SpO2: 97%  Weight: 178 lb 4 oz (80.9 kg)  Height: 5\' 9"  (1.753 m)   Body mass index is 26.32 kg/m.  Advanced Directives 04/09/2021 09/26/2020  Does Patient Have a Medical Advance Directive? No No  Does patient want to make changes to medical advance directive? Yes (MAU/Ambulatory/Procedural Areas - Information given) -  Would patient like information on creating a medical advance directive? - No - Patient declined    Current Medications (verified) Outpatient Encounter Medications as of 04/09/2021  Medication Sig  . acyclovir ointment (ZOVIRAX) 5 % Apply 1 application topically daily as needed (cold sores & herpes).  Marland Kitchen atorvastatin (LIPITOR) 40 MG tablet Take 1 tablet (40 mg total) by mouth daily.  . carvedilol (COREG) 12.5 MG tablet TAKE 1 TABLET(12.5 MG) BY MOUTH TWICE DAILY WITH A MEAL  . Cholecalciferol (VITAMIN D) 50 MCG (2000 UT) CAPS Take 1 capsule by mouth daily in the afternoon.  . clonazePAM (KLONOPIN) 0.5 MG tablet TAKE 1 TABLET(0.5 MG) BY MOUTH AT BEDTIME AS NEEDED FOR ANXIETY OR SLEEP  . clopidogrel (PLAVIX) 75 MG tablet TAKE 1 TABLET(75 MG) BY MOUTH DAILY  . Cyanocobalamin (VITAMIN B-12) 1000 MCG SUBL Place 1 tablet under the tongue daily.   Marland Kitchen diltiazem (CARTIA XT) 180 MG 24 hr capsule TAKE 1 CAPSULE(180 MG) BY MOUTH DAILY  . folic acid (FOLVITE) 1 MG tablet Take 2 tablets (2 mg total) by mouth daily.  Marland Kitchen ibuprofen (ADVIL,MOTRIN) 200 MG tablet Take 200 mg by mouth every 4 (four) hours as needed for headache, mild pain or moderate pain.   Marland Kitchen nystatin-triamcinolone (MYCOLOG II) cream Apply 1 application  topically as needed.  . pantoprazole (PROTONIX) 40 MG tablet Take 1 tablet (40 mg total) by mouth daily.  . predniSONE (DELTASONE) 20 MG tablet 3 tabs poqday 1-2, 2 tabs poqday 3-4, 1 tab poqday 5-6  . ramipril (ALTACE) 10 MG capsule TAKE 1 CAPSULE(10 MG) BY MOUTH DAILY  . sildenafil (REVATIO) 20 MG tablet 3-5 tablets daily as needed  . tamsulosin (FLOMAX) 0.4 MG CAPS capsule Take 0.4 mg by mouth at bedtime.  Marland Kitchen venlafaxine XR (EFFEXOR-XR) 150 MG 24 hr capsule TAKE 1 CAPSULE(150 MG) BY MOUTH DAILY WITH BREAKFAST  . [DISCONTINUED] predniSONE (DELTASONE) 20 MG tablet 3 tabs poqday 1-2, 2 tabs poqday 3-4, 1 tab poqday 5-6   No facility-administered encounter medications on file as of 04/09/2021.    Allergies (verified) Sulfa antibiotics, Esomeprazole magnesium, Septra [bactrim], and Sulfamethoxazole-trimethoprim   History: Past Medical History:  Diagnosis Date  . Adenomatous colon polyp 2015    Buccini (q 5 yrs)   . Barrett's esophagus   . Benign colon polyp   . Depression   . Dermatitis    axillary  . 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 Genital HSV   . GERD (gastroesophageal reflux disease)   . Hx of cardiovascular stress test    ETT-Myoview 9/13: no ischemia, EF 81%  . Hyperlipidemia   . Hypertension   . Hypertrophic cardiomyopathy ([place])    a. Echo 2007: mod LVH, diast dysfn, mild MR, no  LVOT obstruction;  b. echo 9/13: upper septal thickening, no SAM of MV, no LVOT gradient, mild LVH, EF 65%  . Vitamin D deficiency    Past Surgical History:  Procedure Laterality Date  . CERVICAL SPINE SURGERY  [phone]   nerve release, Dr Hal Neer  . SPINE SURGERY     L4-L5, [phone]   Family History  Problem Relation Age of Onset  . Cancer Mother 80       cervical  . Cancer Father        lung  . Stroke Brother   . Atrial fibrillation Other    Social History   Socioeconomic History  . Marital status: Widowed    Spouse name: Not on file  . Number of children: Not on  file  . Years of education: Not on file  . Highest education level: Not on file  Occupational History  . Occupation: Patent examiner: [employer]  Tobacco Use  . Smoking status: Former Smoker    Packs/day: 1.00    Years: 7.00    Pack years: 7.00    Types: Cigarettes    Quit date: 11/29/1975    Years since quitting: 45.3  . Smokeless tobacco: Never Used  Vaping Use  . Vaping Use: Never used  Substance and Sexual Activity  . Alcohol use: Yes    Alcohol/week: 2.0 standard drinks    Types: 2 Standard drinks or equivalent per week  . Drug use: No  . Sexual activity: Not on file  Other Topics Concern  . Not on file  Social History Narrative  . Not on file   Social Determinants of Health   Financial Resource Strain: Low Risk   . Difficulty of Paying Living Expenses: Not hard at all  Food Insecurity: No Food Insecurity  . Worried About Charity fundraiser in the Last Year: Never true  . Ran Out of Food in the Last Year: Never true  Transportation Needs: No Transportation Needs  . Lack of Transportation (Medical): No  . Lack of Transportation (Non-Medical): No  Physical Activity: Inactive  . Days of Exercise per Week: 0 days  . Minutes of Exercise per Session: 0 min  Stress: No Stress Concern Present  . Feeling of Stress : Not at all  Social Connections: Socially Isolated  . Frequency of Communication with Friends and Family: Three times a week  . Frequency of Social Gatherings with Friends and Family: Once a week  . Attends Religious Services: Never  . Active Member of Clubs or Organizations: No  . Attends Archivist Meetings: Never  . Marital Status: Widowed    Tobacco Counseling Counseling given: Not Answered   Clinical Intake:  Pre-visit preparation completed: Yes  Pain : No/denies pain     Nutritional Risks: None Diabetes: No  How often do you need to have someone help you when you read instructions, pamphlets,  or other written materials from your doctor or pharmacy?: 1 - Never  Diabetic?No  Interpreter Needed?: No  Information entered by :: [place] of Daily Living In your present state of health, do you have any difficulty performing the following activities: 04/09/2021 09/26/2020  Hearing? Y N  Vision? N N  Difficulty concentrating or making decisions? N N  Walking or climbing stairs? N N  Dressing or bathing? N N  Doing errands, shopping? N N  Preparing Food and eating ? N -  Using the Toilet? N -  In the past six months, have you accidently leaked urine? Y -  Do you have problems with loss of bowel control? N -  Managing your Medications? N -  Managing your Finances? N -  Housekeeping or managing your Housekeeping? N -  Some recent data might be hidden    Patient Care Team: Susy Frizzle, MD as PCP - General (Family Medicine) Nahser, [name], MD as PCP - Cardiology (Cardiology)  Indicate any recent Medical Services you may have received from other than [facility] providers in the past year (date may be approximate).     Assessment:   This is a routine wellness examination for Keshun.  Hearing/Vision screen  Hearing Screening   125Hz  250Hz  500Hz  1000Hz  2000Hz  3000Hz  4000Hz  6000Hz  8000Hz   Right ear:           Left ear:           Vision Screening Comments: Patient gets annual eye exams. Currently wears reading glasses   Dietary issues and exercise activities discussed: Current Exercise Habits: The patient does not participate in regular exercise at present, Exercise limited by: None identified  Goals Addressed            This Visit's Progress   . Exercise 150 min/wk Moderate Activity      . Weight (lb) < 168 lb (76.2 kg)   178 lb 4 oz (80.9 kg)     Depression Screen PHQ 2/9 Scores 04/09/2021 10/27/2020 04/01/2019 03/26/2018 11/03/2017 03/24/2017 03/18/2016  PHQ - 2 Score 0 0 0 0 5 6 2   PHQ- 9 Score - - - - 11 15 4   Exception Documentation - - - Other-  indicate reason in comment box - - -  Not completed - - - Currently being treated - - -    Fall Risk Fall Risk  04/09/2021 04/01/2019 03/26/2018 03/26/2018 03/24/2017  Falls in the past year? 0 0 No No No  Number falls in past yr: 0 0 - - -  Injury with Fall? 0 - - - -  Risk for fall due to : No Fall Risks - - - -  Follow up Falls evaluation completed;Falls prevention discussed Falls evaluation completed - - -    FALL RISK PREVENTION PERTAINING TO THE HOME:  Any stairs in or around the home? Yes  If so, are there any without handrails? No  Home free of loose throw rugs in walkways, pet beds, electrical cords, etc? Yes  Adequate lighting in your home to reduce risk of falls? Yes   ASSISTIVE DEVICES UTILIZED TO PREVENT FALLS:  Life alert? No  Use of a cane, walker or w/c? No  Grab bars in the bathroom? No  Shower chair or bench in shower? No  Elevated toilet seat or a handicapped toilet? No   TIMED UP AND GO:  Was the test performed? Yes .  Length of time to ambulate 10 feet: 3 sec.   Gait steady and fast without use of assistive device  Cognitive Function:   Normal cognitive status assessed by direct observation by this Nurse Health Advisor. No abnormalities found.        Immunizations Immunization History  Administered Date(s) Administered  . Fluad Quad(high Dose 65+) 11/05/2019, 09/16/2020  . Influenza, High Dose Seasonal PF 09/05/2018  . Influenza,inj,Quad PF,6+ Mos 09/20/2013, 11/19/2014, 09/17/2015, 09/13/2016, 11/03/2017  . Pneumococcal Conjugate-13 03/18/2016  . Pneumococcal Polysaccharide-23 03/17/2015  . Tdap 02/16/2011    TDAP status: Due, Education has been provided regarding the importance  of this vaccine. Advised may receive this vaccine at local pharmacy or Health Dept. Aware to provide a copy of the vaccination record if obtained from local pharmacy or Health Dept. Verbalized acceptance and understanding.  Flu Vaccine status: Up to date  Pneumococcal  vaccine status: Up to date  Covid-19 vaccine status: Declined, Education has been provided regarding the importance of this vaccine but patient still declined. Advised may receive this vaccine at local pharmacy or Health Dept.or vaccine clinic. Aware to provide a copy of the vaccination record if obtained from local pharmacy or Health Dept. Verbalized acceptance and understanding.  Qualifies for Shingles Vaccine? Yes   Zostavax completed No   Shingrix Completed?: Yes  Screening Tests Health Maintenance  Topic Date Due  . TETANUS/TDAP  02/15/2021  . INFLUENZA VACCINE  06/28/2021  . COLONOSCOPY (Pts 45-15yrs Insurance coverage will need to be confirmed)  07/23/2024  . Hepatitis C Screening  Completed  . PNA vac Low Risk Adult  Completed  . HPV VACCINES  Aged Out  . COVID-19 Vaccine  Discontinued    Health Maintenance  Health Maintenance Due  Topic Date Due  . TETANUS/TDAP  02/15/2021    Colorectal cancer screening: Type of screening: Colonoscopy. Completed 07/23/2014. Repeat every 10 years  Lung Cancer Screening: (Low Dose CT Chest recommended if Age 48-80 years, 30 pack-year currently smoking OR have quit w/in 15years.) does not qualify.   Lung Cancer Screening Referral: N/A  Additional Screening:  Hepatitis C Screening: does qualify; Completed 03/18/2016  Vision Screening: Recommended annual ophthalmology exams for early detection of glaucoma and other disorders of the eye. Is the patient up to date with their annual eye exam?  Yes  Who is the provider or what is the name of the office in which the patient attends annual eye exams? Dr. Bufford Buttner If pt is not established with a provider, would they like to be referred to a provider to establish care? No .   Dental Screening: Recommended annual dental exams for proper oral hygiene  Community Resource Referral / Chronic Care Management: CRR required this visit?  No   CCM required this visit?  No      Plan:     I  have personally reviewed and noted the following in the patient's chart:   . Medical and social history . Use of alcohol, tobacco or illicit drugs  . Current medications and supplements including opioid prescriptions. Patient is not currently taking opioid prescriptions. . Functional ability and status . Nutritional status . Physical activity . Advanced directives . List of other physicians . Hospitalizations, surgeries, and ER visits in previous 12 months . Vitals . Screenings to include cognitive, depression, and falls . Referrals and appointments  In addition, I have reviewed and discussed with patient certain preventive protocols, quality metrics, and best practice recommendations. A written personalized care plan for preventive services as well as general preventive health recommendations were provided to patient.     Ofilia Neas, LPN   [id number]   Nurse Notes: None

## 2021-04-09 ENCOUNTER — Ambulatory Visit (INDEPENDENT_AMBULATORY_CARE_PROVIDER_SITE_OTHER): Payer: Medicare Other

## 2021-04-09 ENCOUNTER — Other Ambulatory Visit: Payer: Self-pay

## 2021-04-09 VITALS — BP 128/64 | HR 72 | Temp 98.3°F | Ht 69.0 in | Wt 178.2 lb

## 2021-04-09 DIAGNOSIS — Z0000 Encounter for general adult medical examination without abnormal findings: Secondary | ICD-10-CM

## 2021-04-09 NOTE — Patient Instructions (Signed)
Walter Cross , Thank you for taking time to come for your Medicare Wellness Visit. I appreciate your ongoing commitment to your health goals. Please review the following plan we discussed and let me know if I can assist you in the future.   Screening recommendations/referrals: Colonoscopy: Up to date, next due 07/23/2024 Recommended yearly ophthalmology/optometry visit for glaucoma screening and checkup Recommended yearly dental visit for hygiene and checkup  Vaccinations: Influenza vaccine: Up to date, next due fall 2022  Pneumococcal vaccine: Completed series Tdap vaccine: Currently due, you may await and injury to receive Shingles vaccine: Completed series    Advanced directives: Advanced Directive paperwork given  Conditions/risks identified: None   Next appointment: None   Preventive Care 71 Years and Older, Male Preventive care refers to lifestyle choices and visits with your health care provider that can promote health and wellness. What does preventive care include?  A yearly physical exam. This is also called an annual well check.  Dental exams once or twice a year.  Routine eye exams. Ask your health care provider how often you should have your eyes checked.  Personal lifestyle choices, including:  Daily care of your teeth and gums.  Regular physical activity.  Eating a healthy diet.  Avoiding tobacco and drug use.  Limiting alcohol use.  Practicing safe sex.  Taking low doses of aspirin every day.  Taking vitamin and mineral supplements as recommended by your health care provider. What happens during an annual well check? The services and screenings done by your health care provider during your annual well check will depend on your age, overall health, lifestyle risk factors, and family history of disease. Counseling  Your health care provider may ask you questions about your:  Alcohol use.  Tobacco use.  Drug use.  Emotional well-being.  Home  and relationship well-being.  Sexual activity.  Eating habits.  History of falls.  Memory and ability to understand (cognition).  Work and work Statistician. Screening  You may have the following tests or measurements:  Height, weight, and BMI.  Blood pressure.  Lipid and cholesterol levels. These may be checked every 5 years, or more frequently if you are over 41 years old.  Skin check.  Lung cancer screening. You may have this screening every year starting at age 71 if you have a 30-pack-year history of smoking and currently smoke or have quit within the past 15 years.  Fecal occult blood test (FOBT) of the stool. You may have this test every year starting at age 71.  Flexible sigmoidoscopy or colonoscopy. You may have a sigmoidoscopy every 5 years or a colonoscopy every 10 years starting at age 71.  Prostate cancer screening. Recommendations will vary depending on your family history and other risks.  Hepatitis C blood test.  Hepatitis B blood test.  Sexually transmitted disease (STD) testing.  Diabetes screening. This is done by checking your blood sugar (glucose) after you have not eaten for a while (fasting). You may have this done every 1-3 years.  Abdominal aortic aneurysm (AAA) screening. You may need this if you are a current or former smoker.  Osteoporosis. You may be screened starting at age 71 if you are at high risk. Talk with your health care provider about your test results, treatment options, and if necessary, the need for more tests. Vaccines  Your health care provider may recommend certain vaccines, such as:  Influenza vaccine. This is recommended every year.  Tetanus, diphtheria, and acellular pertussis (Tdap, Td) vaccine.  You may need a Td booster every 10 years.  Zoster vaccine. You may need this after age 94.  Pneumococcal 13-valent conjugate (PCV13) vaccine. One dose is recommended after age 71.  Pneumococcal polysaccharide (PPSV23) vaccine.  One dose is recommended after age 71 Talk to your health care provider about which screenings and vaccines you need and how often you need them. This information is not intended to replace advice given to you by your health care provider. Make sure you discuss any questions you have with your health care provider. Document Released: 12/11/2015 Document Revised: 08/03/2016 Document Reviewed: 09/15/2015 Elsevier Interactive Patient Education  2017 [employer] Prevention in the Home Falls can cause injuries. They can happen to people of all ages. There are many things you can do to make your home safe and to help prevent falls. What can I do on the outside of my home?  Regularly fix the edges of walkways and driveways and fix any cracks.  Remove anything that might make you trip as you walk through a door, such as a raised step or threshold.  Trim any bushes or trees on the path to your home.  Use bright outdoor lighting.  Clear any walking paths of anything that might make someone trip, such as rocks or tools.  Regularly check to see if handrails are loose or broken. Make sure that both sides of any steps have handrails.  Any raised decks and porches should have guardrails on the edges.  Have any leaves, snow, or ice cleared regularly.  Use sand or salt on walking paths during winter.  Clean up any spills in your garage right away. This includes oil or grease spills. What can I do in the bathroom?  Use night lights.  Install grab bars by the toilet and in the tub and shower. Do not use towel bars as grab bars.  Use non-skid mats or decals in the tub or shower.  If you need to sit down in the shower, use a plastic, non-slip stool.  Keep the floor dry. Clean up any water that spills on the floor as soon as it happens.  Remove soap buildup in the tub or shower regularly.  Attach bath mats securely with double-sided non-slip rug tape.  Do not have throw rugs and other  things on the floor that can make you trip. What can I do in the bedroom?  Use night lights.  Make sure that you have a light by your bed that is easy to reach.  Do not use any sheets or blankets that are too big for your bed. They should not hang down onto the floor.  Have a firm chair that has side arms. You can use this for support while you get dressed.  Do not have throw rugs and other things on the floor that can make you trip. What can I do in the kitchen?  Clean up any spills right away.  Avoid walking on wet floors.  Keep items that you use a lot in easy-to-reach places.  If you need to reach something above you, use a strong step stool that has a grab bar.  Keep electrical cords out of the way.  Do not use floor polish or wax that makes floors slippery. If you must use wax, use non-skid floor wax.  Do not have throw rugs and other things on the floor that can make you trip. What can I do with my stairs?  Do not leave any  items on the stairs.  Make sure that there are handrails on both sides of the stairs and use them. Fix handrails that are broken or loose. Make sure that handrails are as long as the stairways.  Check any carpeting to make sure that it is firmly attached to the stairs. Fix any carpet that is loose or worn.  Avoid having throw rugs at the top or bottom of the stairs. If you do have throw rugs, attach them to the floor with carpet tape.  Make sure that you have a light switch at the top of the stairs and the bottom of the stairs. If you do not have them, ask someone to add them for you. What else can I do to help prevent falls?  Wear shoes that:  Do not have high heels.  Have rubber bottoms.  Are comfortable and fit you well.  Are closed at the toe. Do not wear sandals.  If you use a stepladder:  Make sure that it is fully opened. Do not climb a closed stepladder.  Make sure that both sides of the stepladder are locked into place.  Ask  someone to hold it for you, if possible.  Clearly mark and make sure that you can see:  Any grab bars or handrails.  First and last steps.  Where the edge of each step is.  Use tools that help you move around (mobility aids) if they are needed. These include:  Canes.  Walkers.  Scooters.  Crutches.  Turn on the lights when you go into a dark area. Replace any light bulbs as soon as they burn out.  Set up your furniture so you have a clear path. Avoid moving your furniture around.  If any of your floors are uneven, fix them.  If there are any pets around you, be aware of where they are.  Review your medicines with your doctor. Some medicines can make you feel dizzy. This can increase your chance of falling. Ask your doctor what other things that you can do to help prevent falls. This information is not intended to replace advice given to you by your health care provider. Make sure you discuss any questions you have with your health care provider. Document Released: 09/10/2009 Document Revised: 04/21/2016 Document Reviewed: 12/19/2014 Elsevier Interactive Patient Education  2017 [employer].

## 2021-04-29 ENCOUNTER — Telehealth: Payer: Self-pay | Admitting: Family Medicine

## 2021-04-29 NOTE — Chronic Care Management (AMB) (Signed)
  Chronic Care Management   Note  04/29/2021 Name: Walter Cross MRN: [id number] DOB: 11/30/1950  Walter Cross is a 70 y.o. year old male who is a primary care patient of Susy Frizzle, MD. I reached out to [name] by phone today in response to a referral sent by [name]'s PCP, Susy Frizzle, MD.   [name] was given information about Chronic Care Management services today including:  1. CCM service includes personalized support from designated clinical staff supervised by his physician, including individualized plan of care and coordination with other care providers 2. 24/7 contact phone numbers for assistance for urgent and routine care needs. 3. Service will only be billed when office clinical staff spend 20 minutes or more in a month to coordinate care. 4. Only one practitioner may furnish and bill the service in a calendar month. 5. The patient may stop CCM services at any time (effective at the end of the month) by phone call to the office staff.   Patient agreed to services and verbal consent obtained.   Follow up plan:   Tatjana Secretary/administrator

## 2021-05-12 ENCOUNTER — Emergency Department (HOSPITAL_COMMUNITY)
Admission: EM | Admit: 2021-05-12 | Discharge: 2021-05-12 | Disposition: A | Discharge status: Home / Self Care | Payer: Medicare Other | Attending: Emergency Medicine | Admitting: Emergency Medicine

## 2021-05-12 ENCOUNTER — Ambulatory Visit (HOSPITAL_COMMUNITY)
Admission: EM | Admit: 2021-05-12 | Discharge: 2021-05-12 | Disposition: A | Discharge status: Home / Self Care | Payer: Medicare Other

## 2021-05-12 ENCOUNTER — Telehealth: Payer: Self-pay | Admitting: *Deleted

## 2021-05-12 ENCOUNTER — Other Ambulatory Visit: Payer: Self-pay

## 2021-05-12 DIAGNOSIS — Z7902 Long term (current) use of antithrombotics/antiplatelets: Secondary | ICD-10-CM | POA: Diagnosis not present

## 2021-05-12 DIAGNOSIS — R040 Epistaxis: Secondary | ICD-10-CM | POA: Insufficient documentation

## 2021-05-12 DIAGNOSIS — Z87891 Personal history of nicotine dependence: Secondary | ICD-10-CM | POA: Insufficient documentation

## 2021-05-12 DIAGNOSIS — I10 Essential (primary) hypertension: Secondary | ICD-10-CM | POA: Insufficient documentation

## 2021-05-12 DIAGNOSIS — R000 Tachycardia, unspecified: Secondary | ICD-10-CM | POA: Diagnosis not present

## 2021-05-12 DIAGNOSIS — Z79899 Other long term (current) drug therapy: Secondary | ICD-10-CM | POA: Diagnosis not present

## 2021-05-12 LAB — CBC WITH DIFFERENTIAL/PLATELET
Abs Immature Granulocytes: 0.02 10*3/uL (ref 0.00–0.07)
Basophils Absolute: 0.1 10*3/uL (ref 0.0–0.1)
Basophils Relative: 1 %
Eosinophils Absolute: 0 10*3/uL (ref 0.0–0.5)
Eosinophils Relative: 1 %
HCT: 42.8 % (ref 39.0–52.0)
Hemoglobin: 14.9 g/dL (ref 13.0–17.0)
Immature Granulocytes: 0 %
Lymphocytes Relative: 21 %
Lymphs Abs: 1.4 10*3/uL (ref 0.7–4.0)
MCH: 33.3 pg (ref 26.0–34.0)
MCHC: 34.8 g/dL (ref 30.0–36.0)
MCV: 95.5 fL (ref 80.0–100.0)
Monocytes Absolute: 0.7 10*3/uL (ref 0.1–1.0)
Monocytes Relative: 11 %
Neutro Abs: 4.6 10*3/uL (ref 1.7–7.7)
Neutrophils Relative %: 66 %
Platelets: 251 10*3/uL (ref 150–400)
RBC: 4.48 MIL/uL (ref 4.22–5.81)
RDW: 12.9 % (ref 11.5–15.5)
WBC: 6.9 10*3/uL (ref 4.0–10.5)
nRBC: 0 % (ref 0.0–0.2)

## 2021-05-12 LAB — BASIC METABOLIC PANEL
Anion gap: 12 (ref 5–15)
BUN: 23 mg/dL (ref 8–23)
CO2: 24 mmol/L (ref 22–32)
Calcium: 9.3 mg/dL (ref 8.9–10.3)
Chloride: 102 mmol/L (ref 98–111)
Creatinine, Ser: 0.78 mg/dL (ref 0.61–1.24)
GFR, Estimated: >60 mL/min (ref >60)
Glucose, Bld: 124 mg/dL — ABNORMAL HIGH (ref 70–99)
Potassium: 3.7 mmol/L (ref 3.5–5.1)
Sodium: 138 mmol/L (ref 135–145)

## 2021-05-12 LAB — PROTIME-INR
INR: 1 (ref 0.8–1.2)
Prothrombin Time: 12.9 seconds (ref 11.4–15.2)

## 2021-05-12 LAB — APTT: aPTT: 26 seconds (ref 24–36)

## 2021-05-12 MED ORDER — RAMIPRIL 10 MG PO CAPS
10.0000 mg | ORAL_CAPSULE | Freq: Once | ORAL | Status: AC
  Administered 2021-05-12: 10 mg via ORAL
  Filled 2021-05-12: qty 1

## 2021-05-12 MED ORDER — DILTIAZEM HCL ER COATED BEADS 180 MG PO CP24
180.0000 mg | ORAL_CAPSULE | Freq: Once | ORAL | Status: AC
  Administered 2021-05-12: 180 mg via ORAL
  Filled 2021-05-12: qty 1

## 2021-05-12 MED ORDER — CARVEDILOL 12.5 MG PO TABS
12.5000 mg | ORAL_TABLET | Freq: Once | ORAL | Status: AC
  Administered 2021-05-12: 12.5 mg via ORAL
  Filled 2021-05-12: qty 1

## 2021-05-12 MED ORDER — OXYMETAZOLINE HCL 0.05 % NA SOLN
1.0000 | Freq: Three times a day (TID) | NASAL | Status: DC
  Filled 2021-05-12: qty 30

## 2021-05-12 MED ORDER — OXYMETAZOLINE HCL 0.05 % NA SOLN
1.0000 | Freq: Once | NASAL | Status: AC
  Administered 2021-05-12: 1 via NASAL
  Filled 2021-05-12: qty 30

## 2021-05-12 NOTE — Telephone Encounter (Signed)
Received call from patient.   Reports that he has had multiple nosebleeds several times throughout the night. States that nose bleed has copious amount of blood and takes a while to stop the bleeding.   Reports that he stopped taking Plavix on Monday due to nose bleeds.   States that he feels he needs to be seen. Appointment scheduled with NP for 05/14/2021. Reports that he thinks he needs to be seen today. Advised to go to [facility] for evaluation.

## 2021-05-12 NOTE — ED Notes (Signed)
The pt has had nose bleeds since Monday his last nose bleed was at 130 this afternoon  He was seen at urgent care  And they used afrin in bleed since then  He takes plavix and he has not had any plavix his nose.  The afrin stopped the nose bleed  No nose  Bleed  Since then.  He takes plavix and he has stopped taking that since the nose bleeds started

## 2021-05-12 NOTE — ED Notes (Signed)
Patient is being discharged from the Urgent Care and sent to the Emergency Department via POV. Per Caroll Rancher NP, patient is in need of higher level of care due to nose bleed. Patient is aware and verbalizes understanding of plan of care.  Vitals:   05/12/21 1446  BP: (!) 173/108  Pulse: (!) 105  Resp: 18  Temp: 98 F (36.7 C)  SpO2: 94%

## 2021-05-12 NOTE — ED Triage Notes (Addendum)
Pt reports nosebleed starting Monday at noon. Pt is on plavix and decided to stop taking it Monday because of the nosebleed-this was his most recent dose. Pt has iced and applied pressure and bleeding will go away and come back. Currently bleeding is controlled with pressure.

## 2021-05-12 NOTE — ED Provider Notes (Signed)
Emergency Medicine Provider Triage Evaluation Note  Walter Cross , a 70 y.o. male  was evaluated in triage.  Pt complains of intermittent nosebleeds since Monday. Bleeding only occurs from right nostril. He is chronically on Plavix; however stopped on Monday due to the bleeding. Denies lightheadedness, palpitations, and shortness of breath. Denies hematochezia, melena, and hematemesis. Remote history of nosebleeds as a child, but none since. No trauma.  Review of Systems  Positive: epistaxis Negative: fever  Physical Exam  There were no vitals taken for this visit. Gen:   Awake, no distress   Resp:  Normal effort  MSK:   Moves extremities without difficulty  Other:    Medical Decision Making  Medically screening exam initiated at 3:10 PM.  Appropriate orders placed.  [name] was informed that the remainder of the evaluation will be completed by another provider, this initial triage assessment does not replace that evaluation, and the importance of remaining in the ED until their evaluation is complete.  Labs ordered to check hemoglobin and coags. Afrin given here in triage.    Karie Kirks 05/12/21 1515    Charlesetta Shanks, MD 05/23/21 [phone]

## 2021-05-12 NOTE — Discharge Instructions (Signed)
Take all your medications tomorrow as directed.

## 2021-05-12 NOTE — ED Triage Notes (Signed)
Pt sent here from [facility] with reports of epistaxis onset Monday intermittently. Pt on plavix.

## 2021-05-14 ENCOUNTER — Encounter: Payer: Self-pay | Admitting: Nurse Practitioner

## 2021-05-14 ENCOUNTER — Other Ambulatory Visit: Payer: Self-pay

## 2021-05-14 ENCOUNTER — Ambulatory Visit (INDEPENDENT_AMBULATORY_CARE_PROVIDER_SITE_OTHER): Payer: Medicare Other | Admitting: Nurse Practitioner

## 2021-05-14 VITALS — BP 118/60 | HR 94 | Temp 98.4°F | Ht 69.0 in | Wt 172.2 lb

## 2021-05-14 DIAGNOSIS — Z7901 Long term (current) use of anticoagulants: Secondary | ICD-10-CM | POA: Diagnosis not present

## 2021-05-14 NOTE — Progress Notes (Signed)
Subjective:    Patient ID: Walter Cross, male    DOB: [birth year], 70 y.o.   MRN: [id number]  HPI: Walter Cross is a 70 y.o. male presenting for  Chief Complaint  Patient presents with   Epistaxis    3 days of nose bleeds,  went to er, given afrin, stopped bleeding immediately. BP systolic 709, bp is now normal. Stopped plavix on Tuesday, restarted this morning. Did not take meds during the bleeding of the nose. Has not had any bleeds since using the sprays    Nose bleed started Monday; stopped taking Plavix Monday, first dose without Plavix was Tuesday.  Proceeded to have 2 nosebleeds on Tuesday/overnight Wednesday.  Went to urgent care on Wednesday and was told to go the emergency room.  Reports emergency room administered 1 dose of Afrin and this stopped the bleed immediately.  He has been taking Afrin and decreasing on the dose daily since discharge from the emergency room.  He missed his last dose this morning and has started back Plavix this morning without any issues so far.  Patient also reports his batteries in his blood pressure machine at home died recently and his blood pressure is extremely elevated in the emergency room.  He has been checking his blood pressure at home since then and it has been better, but still slightly higher than he would like.  He also notices his heart rate has been higher than he would like.   Allergies  Allergen Reactions   Sulfa Antibiotics     Pt reports causes aches all over   Esomeprazole Magnesium Other (See Comments)    cramps   Septra [Bactrim] Other (See Comments)    Back ache   Sulfamethoxazole-Trimethoprim     Other reaction(s): Other (See Comments) Back ache Back ache Back ache     Outpatient Encounter Medications as of 05/14/2021  Medication Sig   acyclovir ointment (ZOVIRAX) 5 % Apply 1 application topically daily as needed (cold sores & herpes).   atorvastatin (LIPITOR) 40 MG tablet Take 1 tablet (40 mg total) by  mouth daily.   carvedilol (COREG) 12.5 MG tablet TAKE 1 TABLET(12.5 MG) BY MOUTH TWICE DAILY WITH A MEAL   Cholecalciferol (VITAMIN D) 50 MCG (2000 UT) CAPS Take 1 capsule by mouth daily in the afternoon.   clonazePAM (KLONOPIN) 0.5 MG tablet TAKE 1 TABLET(0.5 MG) BY MOUTH AT BEDTIME AS NEEDED FOR ANXIETY OR SLEEP   clopidogrel (PLAVIX) 75 MG tablet TAKE 1 TABLET(75 MG) BY MOUTH DAILY   Cyanocobalamin (VITAMIN B-12) 1000 MCG SUBL Place 1 tablet under the tongue daily.    diltiazem (CARTIA XT) 180 MG 24 hr capsule TAKE 1 CAPSULE(180 MG) BY MOUTH DAILY   folic acid (FOLVITE) 1 MG tablet Take 2 tablets (2 mg total) by mouth daily.   ibuprofen (ADVIL,MOTRIN) 200 MG tablet Take 200 mg by mouth every 4 (four) hours as needed for headache, mild pain or moderate pain.    nystatin-triamcinolone (MYCOLOG II) cream Apply 1 application topically as needed.   pantoprazole (PROTONIX) 40 MG tablet Take 1 tablet (40 mg total) by mouth daily.   predniSONE (DELTASONE) 20 MG tablet 3 tabs poqday 1-2, 2 tabs poqday 3-4, 1 tab poqday 5-6   ramipril (ALTACE) 10 MG capsule TAKE 1 CAPSULE(10 MG) BY MOUTH DAILY   sildenafil (REVATIO) 20 MG tablet 3-5 tablets daily as needed   tamsulosin (FLOMAX) 0.4 MG CAPS capsule Take 0.4 mg by mouth at bedtime.  venlafaxine XR (EFFEXOR-XR) 150 MG 24 hr capsule TAKE 1 CAPSULE(150 MG) BY MOUTH DAILY WITH BREAKFAST   No facility-administered encounter medications on file as of 05/14/2021.    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 Echo 2007: mod LVH, diast dysfn, mild MR, no LVOT obstruction;  b. echo 9/13: upper septal thickening, no SAM of MV, no LVOT gradient, mild LVH, EF 65%   Vitamin D deficiency     Relevant past medical, surgical, family and social history reviewed and updated as indicated. Interim medical history since our last visit reviewed.  Review of Systems  Constitutional: Negative.  Negative for activity change, appetite change, fatigue and fever.  Eyes: Negative.   Respiratory: Negative.    Cardiovascular: Negative.   Skin: Negative.   Neurological: Negative.   Hematological:  Negative for adenopathy. Bruises/bleeds easily.  Psychiatric/Behavioral: Negative.    Per HPI unless specifically indicated above     Objective:    BP 118/60   Pulse 94   Temp 98.4 F (36.9 C)   Ht 5\' 9"  (1.753 m)   Wt 172 lb 3.2 oz (78.1 kg)   SpO2 97%   BMI 25.43 kg/m   Wt Readings from Last 3 Encounters:  05/14/21 172 lb 3.2 oz (78.1 kg)  04/09/21 178 lb 4 oz (80.9 kg)  02/24/21 176 lb (79.8 kg)    Physical Exam Vitals and nursing note reviewed.  Constitutional:      General: He is not in acute distress.    Appearance: Normal appearance. He is not toxic-appearing.  HENT:     Head: Normocephalic.     Right Ear: External ear normal.     Left Ear: External ear normal.     Nose: Nose normal. No rhinorrhea.     Right Nostril: No epistaxis, septal hematoma or occlusion.     Left Nostril: No epistaxis, septal hematoma or occlusion.     Right Turbinates: Not enlarged or swollen.     Left Turbinates: Not enlarged or swollen.     Mouth/Throat:     Mouth: Mucous membranes are moist.     Pharynx: Oropharynx  is clear.  Eyes:     General: No scleral icterus.       Right eye: No discharge.        Left eye: No discharge.  Skin:    General: Skin is warm and dry.     Coloration: Skin is not jaundiced or pale.     Findings: Bruising (Scattered bruising noted to bilateral upper extremities) present. No erythema.  Neurological:     Mental Status: He is alert  and oriented to person, place, and time.     Motor: No weakness.     Gait: Gait normal.  Psychiatric:        Mood and Affect: Mood normal.        Behavior: Behavior normal.        Thought Content: Thought content normal.        Judgment: Judgment normal.    Results for orders placed or performed during the hospital encounter of 05/12/21  CBC with Differential  Result Value Ref Range   WBC 6.9 4.0 - 10.5 K/uL   RBC 4.48 4.22 - 5.81 MIL/uL   Hemoglobin 14.9 13.0 - 17.0 g/dL   HCT 42.8 39.0 - 52.0 %   MCV 95.5 80.0 - 100.0 fL   MCH 33.3 26.0 - 34.0 pg   MCHC 34.8 30.0 - 36.0 g/dL   RDW 12.9 11.5 - 15.5 %   Platelets 251 150 - 400 K/uL   nRBC 0.0 0.0 - 0.2 %   Neutrophils Relative % 66 %   Neutro Abs 4.6 1.7 - 7.7 K/uL   Lymphocytes Relative 21 %   Lymphs Abs 1.4 0.7 - 4.0 K/uL   Monocytes Relative 11 %   Monocytes Absolute 0.7 0.1 - 1.0 K/uL   Eosinophils Relative 1 %   Eosinophils Absolute 0.0 0.0 - 0.5 K/uL   Basophils Relative 1 %   Basophils Absolute 0.1 0.0 - 0.1 K/uL   Immature Granulocytes 0 %   Abs Immature Granulocytes 0.02 0.00 - 0.07 K/uL  Basic metabolic panel  Result Value Ref Range   Sodium 138 135 - 145 mmol/L   Potassium 3.7 3.5 - 5.1 mmol/L   Chloride 102 98 - 111 mmol/L   CO2 24 22 - 32 mmol/L   Glucose, Bld 124 (H) 70 - 99 mg/dL   BUN 23 8 - 23 mg/dL   Creatinine, Ser 0.78 0.61 - 1.24 mg/dL   Calcium 9.3 8.9 - 10.3 mg/dL   GFR, Estimated >60 >60 mL/min   Anion gap 12 5 - 15  Protime-INR  Result Value Ref Range   Prothrombin Time 12.9 11.4 - 15.2 seconds   INR 1.0 0.8 - 1.2  APTT  Result Value Ref  Range   aPTT 26 24 - 36 seconds      Assessment & Plan:  1. Long term (current) use of anticoagulants Discussed that side effects of Plavix include nosebleeds.  No evidence of ongoing bleeding today.  Plan to resume Plavix indefinitely for history of TIA.  Okay to use Afrin as needed if nose bleeds recur, however not for more than 3 days consecutively.  Blood pressure is much improved today; plans to increase physical activity with walking.  Follow up as needd.     Follow up plan: Return if symptoms worsen or fail to improve.

## 2021-05-18 ENCOUNTER — Other Ambulatory Visit: Payer: Self-pay

## 2021-05-18 MED ORDER — FOLIC ACID 1 MG PO TABS: 2.0000 mg | ORAL_TABLET | Freq: Every day | ORAL | 3 refills | Status: DC

## 2021-05-27 NOTE — ED Provider Notes (Signed)
[facility] EMERGENCY DEPARTMENT Provider Note   CSN: [id number] Arrival date & time: 05/12/21  1501     History Chief Complaint  Patient presents with   Epistaxis    Walter Cross is a 70 y.o. male.  The history is provided by the patient. No language interpreter was used.  Epistaxis Location:  R nare Severity:  Moderate Duration:  2 days Pt takes plavix but he has not taken for the past 2 days. Pt reports bleeding has currently stopped.  Pt reports bleeding only from right side.    Pt denies fever or chills, no cough  no uri symptoms.  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 Echo 2007: mod LVH, diast dysfn, mild MR, no LVOT obstruction;  b. echo 9/13: upper septal thickening, no SAM of MV, no LVOT gradient, mild LVH, EF 65%   Vitamin D deficiency     Patient Active Problem List   Diagnosis Date Noted   Macrocytosis without anemia 84/66/5993   Metabolic acidosis 57/11/7791   TIA (transient ischemic attack) 09/25/2020   HTN (hypertension) 10/21/2013   Insomnia 07/04/2013   Chest heaviness 09/23/2012   Chest pain 08/02/2012   Anxiety and depression 08/02/2012   Left ventricular hypertrophy 08/25/2011    Past Surgical History:  Procedure Laterality Date   CERVICAL SPINE SURGERY  1992-1993   nerve release, Dr Hal Neer   SPINE SURGERY     L4-L5, [phone]       Family History  Problem Relation Age of Onset   Cancer Mother 76       cervical   Cancer Father        lung   Stroke Brother    Atrial  fibrillation Other     Social History   Tobacco Use   Smoking status: Former    Packs/day: 1.00    Years: 7.00    Pack years: 7.00    Types: Cigarettes    Quit date: 11/29/1975    Years since quitting: 45.5   Smokeless tobacco: Never  Vaping Use   Vaping Use: Never used  Substance Use Topics   Alcohol use: Yes    Alcohol/week: 2.0 standard drinks    Types: 2 Standard drinks or equivalent per week   Drug use: No    Home Medications Prior to Admission medications   Medication Sig Start Date End Date Taking? Authorizing Provider  acyclovir ointment (ZOVIRAX) 5 % Apply 1 application topically daily as needed (cold sores & herpes). 03/18/16   Susy Frizzle, MD  atorvastatin (LIPITOR) 40 MG tablet Take 1 tablet (40 mg total) by mouth daily. 12/08/20   Nahser, [name], MD  carvedilol (COREG) 12.5 MG tablet TAKE 1 TABLET(12.5 MG) BY MOUTH TWICE DAILY WITH A MEAL 12/15/20   Nahser, [name], MD  Cholecalciferol (VITAMIN D) 50 MCG (2000 UT) CAPS Take 1 capsule by mouth daily in the  afternoon.    [provider]  clonazePAM (KLONOPIN) 0.5 MG tablet TAKE 1 TABLET(0.5 MG) BY MOUTH AT BEDTIME AS NEEDED FOR ANXIETY OR SLEEP 01/25/21   Susy Frizzle, MD  clopidogrel (PLAVIX) 75 MG tablet TAKE 1 TABLET(75 MG) BY MOUTH DAILY 03/30/21   Susy Frizzle, MD  Cyanocobalamin (VITAMIN B-12) 1000 MCG SUBL Place 1 tablet under the tongue daily.     [provider]  diltiazem (CARTIA XT) 180 MG 24 hr capsule TAKE 1 CAPSULE(180 MG) BY MOUTH DAILY 12/16/20   Nahser, [name], MD  folic acid (FOLVITE) 1 MG tablet Take 2 tablets (2 mg total) by mouth daily. 05/18/21   Susy Frizzle, MD  ibuprofen (ADVIL,MOTRIN) 200 MG tablet Take 200 mg by mouth every 4 (four) hours as needed for headache, mild pain or moderate pain.  08/16/12   Richardson Dopp T, PA-C  nystatin-triamcinolone (MYCOLOG II) cream Apply 1 application topically as needed. 07/11/19   [provider]  pantoprazole  (PROTONIX) 40 MG tablet Take 1 tablet (40 mg total) by mouth daily. 12/02/20   Susy Frizzle, MD  predniSONE (DELTASONE) 20 MG tablet 3 tabs poqday 1-2, 2 tabs poqday 3-4, 1 tab poqday 5-6 03/15/21   Susy Frizzle, MD  ramipril (ALTACE) 10 MG capsule TAKE 1 CAPSULE(10 MG) BY MOUTH DAILY 03/15/21   Susy Frizzle, MD  sildenafil (REVATIO) 20 MG tablet 3-5 tablets daily as needed 12/05/19   Nahser, [name], MD  tamsulosin (FLOMAX) 0.4 MG CAPS capsule Take 0.4 mg by mouth at bedtime. 10/23/19   [provider]  venlafaxine XR (EFFEXOR-XR) 150 MG 24 hr capsule TAKE 1 CAPSULE(150 MG) BY MOUTH DAILY WITH BREAKFAST 03/15/21   Susy Frizzle, MD    Allergies    Sulfa antibiotics, Esomeprazole magnesium, Septra [bactrim], and Sulfamethoxazole-trimethoprim  Review of Systems   Review of Systems  HENT:  Positive for nosebleeds.   All other systems reviewed and are negative.  Physical Exam Updated Vital Signs BP (!) 187/112   Pulse (!) 121   Temp 98 F (36.7 C)   Resp 18   SpO2 98%   Physical Exam Vitals and nursing note reviewed.  Constitutional:      Appearance: He is well-developed.  HENT:     Head: Normocephalic.     Nose: Nose normal.     Comments: No bleeding    Mouth/Throat:     Mouth: Mucous membranes are moist.  Cardiovascular:     Rate and Rhythm: Normal rate.  Pulmonary:     Effort: Pulmonary effort is normal.  Abdominal:     General: There is no distension.  Musculoskeletal:        General: Normal range of motion.     Cervical back: Normal range of motion.  Neurological:     Mental Status: He is alert and oriented to person, place, and time.  Psychiatric:        Mood and Affect: Mood normal.    ED Results / Procedures / Treatments   Labs (all labs ordered are listed, but only abnormal results are displayed) Labs Reviewed  BASIC METABOLIC PANEL - Abnormal; Notable for the following components:      Result Value   Glucose, Bld 124 (*)    All  other components within normal limits  CBC WITH DIFFERENTIAL/PLATELET  PROTIME-INR  APTT    EKG EKG Interpretation  Date/Time:  Wednesday May 12 2021 15:20:37 EDT Ventricular Rate:  121 PR  Interval:  132 QRS Duration: 80 QT Interval:  298 QTC Calculation: 423 R Axis:   84 Text Interpretation: Sinus tachycardia Otherwise normal ECG Since prior ECG, rate has increased Confirmed by Gareth Morgan [id number]) on 05/13/2021 11:11:22 AM  Radiology No results found.  Procedures Procedures   Medications Ordered in ED Medications  oxymetazoline (AFRIN) 0.05 % nasal spray 1 spray (1 spray Each Nare Given 05/12/21 1517)  diltiazem (CARDIZEM CD) 24 hr capsule 180 mg (180 mg Oral Given 05/12/21 2054)  carvedilol (COREG) tablet 12.5 mg (12.5 mg Oral Given 05/12/21 2053)  ramipril (ALTACE) capsule 10 mg (10 mg Oral Given 05/12/21 2050)    ED Course  I have reviewed the triage vital signs and the nursing notes.  Pertinent labs & imaging results that were available during my care of the patient were reviewed by me and considered in my medical decision making (see chart for details).    MDM Rules/Calculators/A&P                          MDM:  Labs reviewed.  Pt advised to restart plavix tomorrow.  Follow up with primary  Final Clinical Impression(s) / ED Diagnoses Final diagnoses:  Right-sided epistaxis  Hypertension, unspecified type    Rx / DC Orders ED Discharge Orders     None     An After Visit Summary was printed and given to the patient.    Sidney Ace 05/27/21 1519    Lacretia Leigh, MD 06/02/21 1620

## 2021-06-09 ENCOUNTER — Telehealth: Payer: Self-pay | Admitting: Pharmacist

## 2021-06-09 NOTE — Progress Notes (Addendum)
Chronic Care Management Pharmacy Assistant   Name: Walter Cross  MRN: [id number] DOB: [birth year]  Walter Cross is an 70 y.o. year old male who presents for his initial CCM visit with the clinical pharmacist.  Reason for Encounter: Chart Prep    Conditions to be addressed/monitored: HTN, Anxiety, Depression, Insomnia.   Primary concerns for visit include: HTN.  Recent office visits:  05/14/21 Eulogio Bear, NP. For long term use of anticoagulants. No medication changes. Per note:Plan to resume Plavix indefinitely for history of TIA. 01/12/21 Dr. Dennard Schaumann For follow-up. No medication changes. Per note: The doctor recommended that he switch to Plavix 75 mg daily alone and discontinue the aspirin as recommended by his neurologist.  Recent consult visits:  02/24/21 Neurology Frann Rider, NP. For follow-up. STOPPED Aspirin.   Hospital visits:  05/12/21 [facility] Emergency Department (5 Hours) Lacretia Leigh, MD. For epistaxis. Per note: EKG and labs preformed.   Medication History: Ramipril 10 mg 90 DS 03/16/21 Atorvastatin 40 mg 90 DS 03/13/21  Medications: Outpatient Encounter Medications as of 06/09/2021  Medication Sig   acyclovir ointment (ZOVIRAX) 5 % Apply 1 application topically daily as needed (cold sores & herpes).   atorvastatin (LIPITOR) 40 MG tablet Take 1 tablet (40 mg total) by mouth daily.   carvedilol (COREG) 12.5 MG tablet TAKE 1 TABLET(12.5 MG) BY MOUTH TWICE DAILY WITH A MEAL   Cholecalciferol (VITAMIN D) 50 MCG (2000 UT) CAPS Take 1 capsule by mouth daily in the afternoon.   clonazePAM (KLONOPIN) 0.5 MG tablet TAKE 1 TABLET(0.5 MG) BY MOUTH AT BEDTIME AS NEEDED FOR ANXIETY OR SLEEP   clopidogrel (PLAVIX) 75 MG tablet TAKE 1 TABLET(75 MG) BY MOUTH DAILY   Cyanocobalamin (VITAMIN B-12) 1000 MCG SUBL Place 1 tablet under the tongue daily.    diltiazem (CARTIA XT) 180 MG 24 hr capsule TAKE 1 CAPSULE(180 MG) BY MOUTH DAILY   folic  acid (FOLVITE) 1 MG tablet Take 2 tablets (2 mg total) by mouth daily.   ibuprofen (ADVIL,MOTRIN) 200 MG tablet Take 200 mg by mouth every 4 (four) hours as needed for headache, mild pain or moderate pain.    nystatin-triamcinolone (MYCOLOG II) cream Apply 1 application topically as needed.   pantoprazole (PROTONIX) 40 MG tablet Take 1 tablet (40 mg total) by mouth daily.   predniSONE (DELTASONE) 20 MG tablet 3 tabs poqday 1-2, 2 tabs poqday 3-4, 1 tab poqday 5-6   ramipril (ALTACE) 10 MG capsule TAKE 1 CAPSULE(10 MG) BY MOUTH DAILY   sildenafil (REVATIO) 20 MG tablet 3-5 tablets daily as needed   tamsulosin (FLOMAX) 0.4 MG CAPS capsule Take 0.4 mg by mouth at bedtime.   venlafaxine XR (EFFEXOR-XR) 150 MG 24 hr capsule TAKE 1 CAPSULE(150 MG) BY MOUTH DAILY WITH BREAKFAST   No facility-administered encounter medications on file as of 06/09/2021.    Have you seen any other providers since your last visit? Patient stated no.  Any changes in your medications or health? Patient stated no.  Any side effects from any medications? Patient stated he seems to be more drowsy and is sleeping more.  Do you have an symptoms or problems not managed by your medications? Patient stated no.  Any concerns about your health right now? Patient stated no.  Has your provider asked that you check blood pressure, blood sugar, or follow special diet at home? Patient stated he monitors his blood pressure.   Do you get any type of exercise on  a regular basis? Patient stated no.  Can you think of a goal you would like to reach for your health? Patient stated to have more energy.   Do you have any problems getting your medications? Patient stated no.  Is there anything that you would like to discuss during the appointment? Patient stated he would like to figure out which medications are best to take in the morning and at night because he seems to be a little more fatigue.   Please bring medications and supplements  to appointment, patient reminded of his face to face 06/17/21 at 330 pm.   Follow-Up:Pharmacist Review  Charlann Lange, [place] Pharmacist Assistant [phone]

## 2021-06-11 ENCOUNTER — Other Ambulatory Visit: Payer: Self-pay | Admitting: Family Medicine

## 2021-06-12 ENCOUNTER — Encounter: Payer: Self-pay | Admitting: Cardiovascular Disease

## 2021-06-12 NOTE — Progress Notes (Signed)
Walter Cross Date of Birth  [birth year] [facility] [address]    [place], [place]  [address] [phone]  Fax  [phone]   70 year old gentleman with a history of a mild LVH. He is done very well on medical therapy. He's been on carvedilol and Cardizem. He does not have any episodes of chest pain or shortness breath. He exercises occasionally.  He's looking forward to getting up and walking work this summer and spring.  Apr 23, 2013:  Walter Cross presents with some episodes of chest pain last year. He had a stress Myoview study which was normal. He's had a recent echocardiogram which did not reveal any LVOT gradient  Walter Cross placed him on an acid blocker and he is feeling well.    Nov. 24, 2014:    he was having some CP the last time I saw him.    A stress Myoview study which revealed no evidence of ischemia. He had normal left ventricular systolic function with an ejection fraction of 81%.  His CP have resolved after being started her on an acid blocker by Dr. [name].  Dec. 1, 2015:  Walter Cross is doing well. No cardiac issues.  No further GERD issues.   Admits that he could use a bit more exercise.  Dec. 1, 2016:  Doing great.   Exercising well.  Has vivid dreams - he thinks its due to the coreg   Dec. 8, 2017:  Doing well.   BP is slightly elevated but typically is normal . Avoids salt .    Not exercising much .    No CP or dyspnea.    Dec. 12, 2018: Doing well .  No CP ,.  Asymptomatic   November 08, 2018: Seen today for follow-up of his  HTN and LVH   He remains asymptomatic. Overall is doing well  Not Exercising as much  No CP or dyspnea  Has some dizziness on occasion   Jan. 7, 2021 Walter Cross is seen today for follow up of his HTN and mild LVH on echo  Has some DOE  Not exercising regularly .  Still working on his 2002 .    Jan. 7, 2022: Walter Cross is seen today for follow up of his HTN and his mild LVH on echo  Had a TIA in  Nov. Had numbness of his right arm,  Then leg numbness. Lasted several minutes Event monitor showed no afib. CT scan was normal Brain MRI  - showed a small left sided lesion  Is now in ASA 325 mg  Instead of 81  LDL was found to be 89.  He was started on atorvastatin.  He wants to stop it  MRA of the neck was normal.  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  June 14, 2021: Walter Cross is seen today for follow up of his HTN and LVH. BP at home is in the normal range.  Not exercising much   Does not want to start meds, would rather exercise   Current Outpatient Medications on File Prior to Visit  Medication Sig Dispense Refill   acyclovir ointment (ZOVIRAX) 5 % Apply 1 application topically daily as needed (cold sores & herpes). 15 g 1   atorvastatin (LIPITOR) 40 MG tablet Take 1 tablet (40 mg total) by mouth daily. 90 tablet 3   carvedilol (COREG) 12.5 MG tablet TAKE 1 TABLET(12.5 MG) BY MOUTH TWICE DAILY WITH A MEAL 180 tablet 3   Cholecalciferol (  VITAMIN D) 50 MCG (2000 UT) CAPS Take 1 capsule by mouth daily in the afternoon.     clonazePAM (KLONOPIN) 0.5 MG tablet TAKE 1 TABLET(0.5 MG) BY MOUTH AT BEDTIME AS NEEDED FOR ANXIETY OR SLEEP 30 tablet 0   clopidogrel (PLAVIX) 75 MG tablet TAKE 1 TABLET(75 MG) BY MOUTH DAILY 90 tablet 0   Cyanocobalamin (VITAMIN B-12) 1000 MCG SUBL Place 1 tablet under the tongue daily.      diltiazem (CARTIA XT) 180 MG 24 hr capsule TAKE 1 CAPSULE(180 MG) BY MOUTH DAILY 90 capsule 3   folic acid (FOLVITE) 1 MG tablet Take 2 tablets (2 mg total) by mouth daily. 60 tablet 3   ibuprofen (ADVIL,MOTRIN) 200 MG tablet Take 200 mg by mouth every 4 (four) hours as needed for headache, mild pain or moderate pain.      nystatin-triamcinolone (MYCOLOG II) cream Apply 1 application topically as needed.     pantoprazole (PROTONIX) 40 MG tablet Take 1 tablet (40 mg total) by mouth daily. 90 tablet 1   predniSONE (DELTASONE) 20 MG tablet 3 tabs poqday 1-2,  2 tabs poqday 3-4, 1 tab poqday 5-6 12 tablet 0   ramipril (ALTACE) 10 MG capsule TAKE 1 CAPSULE(10 MG) BY MOUTH DAILY 90 capsule 3   sildenafil (REVATIO) 20 MG tablet 3-5 tablets daily as needed 50 tablet 6   tamsulosin (FLOMAX) 0.4 MG CAPS capsule Take 0.4 mg by mouth at bedtime.     venlafaxine XR (EFFEXOR-XR) 150 MG 24 hr capsule TAKE 1 CAPSULE(150 MG) BY MOUTH DAILY WITH BREAKFAST 90 capsule 3   No current facility-administered medications on file prior to visit.    Allergies  Allergen Reactions   Sulfa Antibiotics     Pt reports causes aches all over   Esomeprazole Magnesium Other (See Comments)    cramps   Septra [Bactrim] Other (See Comments)    Back ache   Sulfamethoxazole-Trimethoprim     Other reaction(s): Other (See Comments) Back ache Back ache Back ache     Past Medical History:  Diagnosis Date   Adenomatous colon polyp 2015    Cross (q 5 yrs)    Barrett's esophagus    Benign colon polyp    Depression    Dermatitis    axillary   Exposure to asbestos    auto brake liners   Genital HSV    GERD (gastroesophageal reflux disease)    Hx of cardiovascular stress test    ETT-Myoview 9/13: no ischemia, EF 81%   Hyperlipidemia    Hypertension    Hypertrophic cardiomyopathy ([place])    a. Echo 2007: mod LVH, diast dysfn, mild MR, no LVOT obstruction;  b. echo 9/13: upper septal thickening, no SAM of MV, no LVOT gradient, mild LVH, EF 65%   Vitamin D deficiency     Past Surgical History:  Procedure Laterality Date   CERVICAL SPINE SURGERY  1992-1993   nerve release, Dr Hal Neer   SPINE SURGERY     L4-L5, [phone]    Social History   Tobacco Use  Smoking Status Former   Packs/day: 1.00   Years: 7.00   Pack years: 7.00   Types: Cigarettes   Quit date: 11/29/1975   Years since quitting: 45.5  Smokeless Tobacco Never    Social History   Substance and Sexual Activity  Alcohol Use Yes   Alcohol/week: 2.0 standard drinks   Types: 2 Standard drinks or  equivalent per week    Family History  Problem Relation  Age of Onset   Cancer Mother 89       cervical   Cancer Father        lung   Stroke Brother    Atrial fibrillation Other     Reviw of Systems:  Reviewed in the HPI.  All other systems are negative.   Physical Exam: Blood pressure (!) 142/78, pulse 77, height 5\' 9"  (1.753 m), weight 174 lb 3.2 oz (79 kg), SpO2 97 %.  GEN:  Well nourished, well developed in no acute distress HEENT: Normal NECK: No JVD; No carotid bruits LYMPHATICS: No lymphadenopathy CARDIAC: RRR,  soft systolic murur  RESPIRATORY:  Clear to auscultation without rales, wheezing or rhonchi  ABDOMEN: Soft, non-tender, non-distended MUSCULOSKELETAL:  No edema; No deformity  SKIN: Warm and dry NEUROLOGIC:  Alert and oriented x 3   EKG:     Assessment / Plan:   1.  HTN:       :  BP is generally well controlled.   Ddid not want to add any medications .  Wants to start exercising and see if that helps    2.  TIA:    2.   hyperlipidemia:  lipids look great.  Managed by his primary md    [name], MD  06/14/2021 3:42 PM    [address],  [place], [place]  [address] Pager [phone] Phone: [phone]; Fax: [phone]

## 2021-06-14 ENCOUNTER — Ambulatory Visit (INDEPENDENT_AMBULATORY_CARE_PROVIDER_SITE_OTHER): Payer: Medicare Other | Admitting: Cardiovascular Disease

## 2021-06-14 ENCOUNTER — Other Ambulatory Visit: Payer: Self-pay

## 2021-06-14 ENCOUNTER — Ambulatory Visit: Payer: Medicare Other | Admitting: Cardiovascular Disease

## 2021-06-14 ENCOUNTER — Encounter: Payer: Self-pay | Admitting: Cardiovascular Disease

## 2021-06-14 VITALS — BP 142/78 | HR 77 | Ht 69.0 in | Wt 174.2 lb

## 2021-06-14 DIAGNOSIS — I10 Essential (primary) hypertension: Secondary | ICD-10-CM | POA: Diagnosis not present

## 2021-06-14 DIAGNOSIS — I517 Cardiomegaly: Secondary | ICD-10-CM

## 2021-06-14 NOTE — Patient Instructions (Signed)
Medication Instructions:  Your physician recommends that you continue on your current medications as directed. Please refer to the Current Medication list given to you today.  Labwork: None ordered.  Testing/Procedures: None ordered.  Follow-Up: Your physician recommends that you schedule a follow-up appointment in:   12 months with Dr. Acie Fredrickson  Any Other Special Instructions Will Be Listed Below (If Applicable).     If you need a refill on your cardiac medications before your next appointment, please call your pharmacy.

## 2021-06-15 NOTE — Progress Notes (Signed)
Chronic Care Management Pharmacy Note  06/18/2021 Name:  Walter Cross MRN:  [id number] DOB:  1951-10-13  Summary: Initial visit with PharmD.  Patient reports a lot of daytime sleepiness, possible untreated depression leading to this as he mentions this is a possibility.  All meds reviewed with patient and updated list.  Recommendations/Changes made from today's visit: Move diltiazem to PM dosing to see if this helps Counseled on importance of statin  Plan: FU in 4 months CPA PHQ9 in two months   Subjective: Walter Cross is an 70 y.o. year old male who is a primary patient of Pickard, Cammie Mcgee, MD.  The CCM team was consulted for assistance with disease management and care coordination needs.    Engaged with patient face to face for initial visit in response to provider referral for pharmacy case management and/or care coordination services.   Consent to Services:  The patient was given the following information about Chronic Care Management services today, agreed to services, and gave verbal consent: 1. CCM service includes personalized support from designated clinical staff supervised by the primary care provider, including individualized plan of care and coordination with other care providers 2. 24/7 contact phone numbers for assistance for urgent and routine care needs. 3. Service will only be billed when office clinical staff spend 20 minutes or more in a month to coordinate care. 4. Only one practitioner may furnish and bill the service in a calendar month. 5.The patient may stop CCM services at any time (effective at the end of the month) by phone call to the office staff. 6. The patient will be responsible for cost sharing (co-pay) of up to 20% of the service fee (after annual deductible is met). Patient agreed to services and consent obtained.  Patient Care Team: Susy Frizzle, MD as PCP - General (Family Medicine) Nahser, [name], MD as PCP - Cardiology  (Cardiology) Edythe Clarity, [facility] as Pharmacist (Pharmacist)  Recent office visits:  05/14/21 Eulogio Bear, NP. For long term use of anticoagulants. No medication changes. Per note:Plan to resume Plavix indefinitely for history of TIA. 01/12/21 Dr. Dennard Schaumann For follow-up. No medication changes. Per note: The doctor recommended that he switch to Plavix 75 mg daily alone and discontinue the aspirin as recommended by his neurologist.   Recent consult visits:  06/14/21 Cardiology Nahser, Arnette Norris - no changes to meds, lipids well controlled.  BP slightly elevated he wants to fix with diet/exercise before adding any medications.  02/24/21 Neurology Frann Rider, NP. For follow-up. STOPPED Aspirin.   Hospital visits:  05/12/21 [facility] Emergency Department (5 Hours) Lacretia Leigh, MD. For epistaxis. Per note: EKG and labs preformed.   Medication History: Ramipril 10 mg 90 DS 03/16/21 Atorvastatin 40 mg 90 DS 03/13/21   Objective:  Lab Results  Component Value Date   CREATININE 0.78 05/12/2021   BUN 23 05/12/2021   GFR 96.72 04/16/2013   GFRNONAA >60 05/12/2021   GFRAA 100 12/04/2020   NA 138 05/12/2021   K 3.7 05/12/2021   CALCIUM 9.3 05/12/2021   CO2 24 05/12/2021   GLUCOSE 124 (H) 05/12/2021    Lab Results  Component Value Date/Time   HGBA1C 5.5 09/26/2020 03:06 AM   HGBA1C 5.8 (H) 03/17/2015 09:56 AM   GFR 96.72 04/16/2013 09:21 AM    Last diabetic Eye exam: No results found for: HMDIABEYEEXA  Last diabetic Foot exam: No results found for: HMDIABFOOTEX   Lab Results  Component Value Date  CHOL 135 03/08/2021   HDL 80 03/08/2021   LDLCALC 36 03/08/2021   LDLDIRECT 115.6 04/16/2013   TRIG 109 03/08/2021   CHOLHDL 1.7 03/08/2021    Hepatic Function Latest Ref Rng & Units 03/08/2021 12/04/2020 09/25/2020  Total Protein 6.0 - 8.5 g/dL 6.6 7.0 6.7  Albumin 3.7 - 4.7 g/dL 4.4 4.3 3.8  AST 0 - 40 IU/L 18 22 22   ALT 0 - 44 IU/L 18 22 17   Alk  Phosphatase 44 - 121 IU/L 91 90 64  Total Bilirubin 0.0 - 1.2 mg/dL 0.5 0.8 0.8  Bilirubin, Direct 0.00 - 0.40 mg/dL 0.20 0.27 -    Lab Results  Component Value Date/Time   TSH 1.48 03/24/2017 09:59 AM   TSH 2.402 03/11/2014 09:40 AM    CBC Latest Ref Rng & Units 05/12/2021 09/25/2020 09/25/2020  WBC 4.0 - 10.5 K/uL 6.9 - 6.9  Hemoglobin 13.0 - 17.0 g/dL 14.9 15.6 15.9  Hematocrit 39.0 - 52.0 % 42.8 46.0 46.0  Platelets 150 - 400 K/uL 251 - 236    No results found for: VD25OH  Clinical ASCVD: No  The 10-year ASCVD risk score Mikey Bussing DC Jr., et al., 2013) is: 18.3%   Values used to calculate the score:     Age: 34 years     Sex: Male     Is Non-Hispanic African American: No     Diabetic: No     Tobacco smoker: No     Systolic Blood Pressure: 453 mmHg     Is BP treated: Yes     HDL Cholesterol: 80 mg/dL     Total Cholesterol: 135 mg/dL    Depression screen [facility] 2/9 05/14/2021 04/09/2021 10/27/2020  Decreased Interest 0 0 0  Down, Depressed, Hopeless 0 0 0  PHQ - 2 Score 0 0 0  Altered sleeping - - -  Tired, decreased energy - - -  Change in appetite - - -  Feeling bad or failure about yourself  - - -  Trouble concentrating - - -  Moving slowly or fidgety/restless - - -  Suicidal thoughts - - -  PHQ-9 Score - - -  Difficult doing work/chores - - -     Social History   Tobacco Use  Smoking Status Former   Packs/day: 1.00   Years: 7.00   Pack years: 7.00   Types: Cigarettes   Quit date: 11/29/1975   Years since quitting: 45.5  Smokeless Tobacco Never   BP Readings from Last 3 Encounters:  06/14/21 (!) 142/78  05/14/21 118/60  05/12/21 (!) 187/112   Pulse Readings from Last 3 Encounters:  06/14/21 77  05/14/21 94  05/12/21 (!) 121   Wt Readings from Last 3 Encounters:  06/14/21 174 lb 3.2 oz (79 kg)  05/14/21 172 lb 3.2 oz (78.1 kg)  04/09/21 178 lb 4 oz (80.9 kg)   BMI Readings from Last 3 Encounters:  06/14/21 25.72 kg/m  05/14/21 25.43 kg/m   04/09/21 26.32 kg/m    Assessment/Interventions: Review of patient past medical history, allergies, medications, health status, including review of consultants reports, laboratory and other test data, was performed as part of comprehensive evaluation and provision of chronic care management services.   SDOH:  (Social Determinants of Health) assessments and interventions performed: Yes  Financial Resource Strain: Low Risk    Difficulty of Paying Living Expenses: Not hard at all    SDOH Screenings   Alcohol Screen: Low Risk    Last Alcohol Screening Score (  AUDIT): 5  Depression (PHQ2-9): Low Risk    PHQ-2 Score: 0  Financial Resource Strain: Low Risk    Difficulty of Paying Living Expenses: Not hard at all  Food Insecurity: No Food Insecurity   Worried About Charity fundraiser in the Last Year: Never true   Ran Out of Food in the Last Year: Never true  Housing: Low Risk    Last Housing Risk Score: 0  Physical Activity: Inactive   Days of Exercise per Week: 0 days   Minutes of Exercise per Session: 0 min  Social Connections: Socially Isolated   Frequency of Communication with Friends and Family: Three times a week   Frequency of Social Gatherings with Friends and Family: Once a week   Attends Religious Services: Never   Marine scientist or Organizations: No   Attends Archivist Meetings: Never   Marital Status: Widowed  Stress: No Stress Concern Present   Feeling of Stress : Not at all  Tobacco Use: Medium Risk   Smoking Tobacco Use: Former   Smokeless Tobacco Use: Never  Transportation Needs: No Data processing manager (Medical): No   Lack of Transportation (Non-Medical): No    CCM Care Plan  Allergies  Allergen Reactions   Sulfa Antibiotics     Pt reports causes aches all over   Esomeprazole Magnesium Other (See Comments)    cramps   Septra [Bactrim] Other (See Comments)    Back ache   Sulfamethoxazole-Trimethoprim      Other reaction(s): Other (See Comments) Back ache Back ache Back ache     Medications Reviewed Today     Reviewed by Edythe Clarity, [facility] (Pharmacist) on 06/18/21 at [place] List Status: <None>   Medication Order Taking? Sig Documenting Provider Last Dose Status Informant  acyclovir ointment (ZOVIRAX) 5 % [id number] Yes Apply 1 application topically daily as needed (cold sores & herpes). Susy Frizzle, MD Taking Active Self  atorvastatin (LIPITOR) 40 MG tablet [id number] Yes Take 1 tablet (40 mg total) by mouth daily. Nahser, [name], MD Taking Active   carvedilol (COREG) 12.5 MG tablet [id number] Yes TAKE 1 TABLET(12.5 MG) BY MOUTH TWICE DAILY WITH A MEAL Nahser, [name], MD Taking Active   Cholecalciferol (VITAMIN D) 50 MCG (2000 UT) CAPS [id number] Yes Take 1 capsule by mouth daily in the afternoon. [provider] Taking Active   clonazePAM (KLONOPIN) 0.5 MG tablet [id number] Yes TAKE 1 TABLET(0.5 MG) BY MOUTH AT BEDTIME AS NEEDED FOR ANXIETY OR SLEEP Susy Frizzle, MD Taking Active   clopidogrel (PLAVIX) 75 MG tablet [id number] Yes TAKE 1 TABLET(75 MG) BY MOUTH DAILY Susy Frizzle, MD Taking Active   Cyanocobalamin (VITAMIN B-12) 1000 MCG SUBL [id number] Yes Place 1 tablet under the tongue daily.  [provider] Taking Active Self  diltiazem (CARTIA XT) 180 MG 24 hr capsule [id number] Yes TAKE 1 CAPSULE(180 MG) BY MOUTH DAILY Nahser, [name], MD Taking Active   folic acid (FOLVITE) 1 MG tablet [id number] Yes Take 2 tablets (2 mg total) by mouth daily. Susy Frizzle, MD Taking Active   ibuprofen (ADVIL,MOTRIN) 200 MG tablet [id number] Yes Take 200 mg by mouth every 4 (four) hours as needed for headache, mild pain or moderate pain.  Liliane Shi, PA-C Taking Active Self  nystatin-triamcinolone (MYCOLOG II) cream [id number] Yes Apply 1 application topically as needed. [provider] Taking Active Self  pantoprazole (PROTONIX) 40 MG tablet [id number]  Yes Take 1 tablet (40 mg total) by mouth daily. Susy Frizzle, MD Taking Active   predniSONE (DELTASONE) 20 MG tablet [id number] No 3 tabs poqday 1-2, 2 tabs poqday 3-4, 1 tab poqday 5-6  Patient not taking: Reported on 06/18/2021   Susy Frizzle, MD Not Taking Active   ramipril (ALTACE) 10 MG capsule [id number] Yes TAKE 1 CAPSULE(10 MG) BY MOUTH DAILY Susy Frizzle, MD Taking Active   sildenafil (REVATIO) 20 MG tablet [id number] Yes 3-5 tablets daily as needed Nahser, [name], MD Taking Active Self  tamsulosin (FLOMAX) 0.4 MG CAPS capsule [id number] Yes Take 0.4 mg by mouth at bedtime. [provider] Taking Active Self  venlafaxine XR (EFFEXOR-XR) 150 MG 24 hr capsule [id number] Yes TAKE 1 CAPSULE(150 MG) BY MOUTH DAILY WITH BREAKFAST Susy Frizzle, MD Taking Active             Patient Active Problem List   Diagnosis Date Noted   Macrocytosis without anemia 59/93/5701   Metabolic acidosis 77/93/9030   TIA (transient ischemic attack) 09/25/2020   HTN (hypertension) 10/21/2013   Insomnia 07/04/2013   Chest heaviness 09/23/2012   Chest pain 08/02/2012   Anxiety and depression 08/02/2012   Left ventricular hypertrophy 08/25/2011    Immunization History  Administered Date(s) Administered   Fluad Quad(high Dose 65+) 11/05/2019, 09/16/2020   Influenza, High Dose Seasonal PF 09/05/2018   Influenza,inj,Quad PF,6+ Mos 09/20/2013, 11/19/2014, 09/17/2015, 09/13/2016, 11/03/2017   Pneumococcal Conjugate-13 03/18/2016   Pneumococcal Polysaccharide-23 03/17/2015   Tdap 02/16/2011    Conditions to be addressed/monitored:  HTN, HX of TIA, Depression/Anxiety, HLD, Insomnia  Care Plan : General Pharmacy (Adult)  Updates made by Edythe Clarity, RPH since 06/18/2021 12:00 AM     Problem: HTN, HX of TIA, Depression/Anxiety, HLD, Insomnia   Priority: High  Onset Date: 06/18/2021     Long-Range Goal: Patient-Specific Goal   Start Date: 06/18/2021  Expected End Date:  12/19/2021  This Visit's Progress: On track  Priority: High  Note:   Current Barriers:  Unable to independently monitor therapeutic efficacy  Pharmacist Clinical Goal(s):  Patient will achieve control of daytime sleepiness as evidenced by symptoms through collaboration with PharmD and provider.   Interventions: 1:1 collaboration with Susy Frizzle, MD regarding development and update of comprehensive plan of care as evidenced by provider attestation and co-signature Inter-disciplinary care team collaboration (see longitudinal plan of care) Comprehensive medication review performed; medication list updated in electronic medical record  Hypertension (BP goal <140/90) -Controlled -Current treatment: Ramipril 20m daily Diltiazem 180 CD daily Carvedilol 12.555mBID -Medications previously tried: none noted  -Current home readings: does not monitor all the time but reports it has been normal to on the high side  -Current exercise habits: not much lately, has recently wanted to be more active and started walking today actually.  Plans to increase physical activity -Denies hypotensive/hypertensive symptoms - however does report he is very tired all of the time -Educated on BP goals and benefits of medications for prevention of heart attack, stroke and kidney damage; Daily salt intake goal < 2300 mg; Exercise goal of 150 minutes per week; Importance of home blood pressure monitoring; Symptoms of hypotension and importance of maintaining adequate hydration; -Counseled to monitor BP at home weekly, document, and provide log at future appointments -Recommended to continue current medication Recommended he try switching Diltiazem to night time dosing to see if splitting up his daytime BP meds helps with daytime sleepiness.  Hyperlipidemia: (LDL  goal < 70) -Controlled -Current treatment: Atorvastatin 220m daily -Medications previously tried: none noted  -Educated on Cholesterol goals;   Benefits of statin for ASCVD risk reduction; Importance of limiting foods high in cholesterol; Exercise goal of 150 minutes per week; Strategies to manage statin-induced myalgias; -Most recent LDL is excellent - patient is concerned his tiredness may be coming from statin as well as memory loss.  Stressed importance of these medications on reducing cardiovascular events, he is agreeable to continue. -Recommended to continue current medication Patient interested in decreasing dose and plans to follow up with cardiology on this.    Depression/Anxiety (Goal: Minimize symptoms) -Controlled -Current treatment: Venlafaxine ER 152mdaily Clonazepam 0.20m68mprn -Medications previously tried/failed: Lexapro -PHQ9:  [place]lo[place] Total Score 11     -GAD7: No flowsheet data found. -Educated on Benefits of medication for symptom control -Patient admits there still could be symptoms of depression, ie sleeping all day and late in the morning.  Wants to try exercise to see if this helps. -Recommended to continue current medication Encouraged him to let me know or come see Dr. PicDennard Schaumann symptoms worsen or do not improve with exercise.  Insomnia (Goal: Reduce symptoms) -Controlled -Current treatment  None noted -Medications previously tried: none -No reports of sleeping disturbances, in fact he sleeps too much per his wife and his own reports -Could be some untreated depression - patient aware to follow up if it does not improve  -Recommended continue current management strategies  Patient Goals/Self-Care Activities Patient will:  - take medications as prescribed focus on medication adherence by pill box check blood pressure a few times per week, document, and provide at future appointments  Follow Up Plan: The care management team will reach out to the patient again over the next 120 days.        Medication Assistance:  None required.  Patient affirms current coverage meets needs.  Compliance/Adherence/Medication fill history: Care Gaps: None at this time  Star-Rating Drugs: Ramipril 10 mg 90 DS 03/16/21 Atorvastatin 40 mg 90 DS 03/13/21  Patient's preferred pharmacy is:  [facility] [place] [id number]: [phone]: [id number] [place]one: [phone]: [id number] [place] [address]one: [phone]: [phone] pill box? Yes Pt endorses 100% compliance  We discussed: Benefits of medication synchronization, packaging and delivery as well as enhanced pharmacist oversight with [employer]. Patient decided to: Continue current medication management strategy  Care Plan and Follow Up Patient Decision:  Patient agrees to Care Plan and Follow-up.  Plan: The care management team will reach out to the patient again over the next 120 days.  ChrBeverly MilchharmD Clinical Pharmacist [phone]

## 2021-06-17 ENCOUNTER — Other Ambulatory Visit: Payer: Self-pay

## 2021-06-17 ENCOUNTER — Ambulatory Visit (INDEPENDENT_AMBULATORY_CARE_PROVIDER_SITE_OTHER): Payer: Medicare Other | Admitting: Pharmacist

## 2021-06-17 DIAGNOSIS — E7800 Pure hypercholesterolemia, unspecified: Secondary | ICD-10-CM

## 2021-06-17 DIAGNOSIS — F419 Anxiety disorder, unspecified: Secondary | ICD-10-CM | POA: Diagnosis not present

## 2021-06-17 DIAGNOSIS — I10 Essential (primary) hypertension: Secondary | ICD-10-CM | POA: Diagnosis not present

## 2021-06-17 DIAGNOSIS — F32A Depression, unspecified: Secondary | ICD-10-CM

## 2021-06-18 NOTE — Patient Instructions (Addendum)
Visit Information   Goals Addressed             This Visit's Progress    Track and Manage My Symptoms-Depression       Timeframe:  Long-Range Goal Priority:  High Start Date:  06/18/21                           Expected End Date: 12/28/21                       Follow Up Date 09/27/21    - avoid negative self-talk - develop a personal safety plan - exercise at least 2 to 3 times per week - have a plan for how to handle bad days - spend time or talk with others at least 2 to 3 times per week    Why is this important?   Keeping track of your progress will help your treatment team find the right mix of medicine and therapy for you.  Write in your journal every day.  Day-to-day changes in depression symptoms are normal. It may be more helpful to check your progress at the end of each week instead of every day.     Notes:        Patient Care Plan: General Pharmacy (Adult)     Problem Identified: HTN, HX of TIA, Depression/Anxiety, HLD, Insomnia   Priority: High  Onset Date: 06/18/2021     Long-Range Goal: Patient-Specific Goal   Start Date: 06/18/2021  Expected End Date: 12/19/2021  This Visit's Progress: On track  Priority: High  Note:   Current Barriers:  Unable to independently monitor therapeutic efficacy  Pharmacist Clinical Goal(s):  Patient will achieve control of daytime sleepiness as evidenced by symptoms through collaboration with PharmD and provider.   Interventions: 1:1 collaboration with Susy Frizzle, MD regarding development and update of comprehensive plan of care as evidenced by provider attestation and co-signature Inter-disciplinary care team collaboration (see longitudinal plan of care) Comprehensive medication review performed; medication list updated in electronic medical record  Hypertension (BP goal <140/90) -Controlled -Current treatment: Ramipril '10mg'$  daily Diltiazem 180 CD daily Carvedilol 12.'5mg'$  BID -Medications previously tried:  none noted  -Current home readings: does not monitor all the time but reports it has been normal to on the high side  -Current exercise habits: not much lately, has recently wanted to be more active and started walking today actually.  Plans to increase physical activity -Denies hypotensive/hypertensive symptoms - however does report he is very tired all of the time -Educated on BP goals and benefits of medications for prevention of heart attack, stroke and kidney damage; Daily salt intake goal < 2300 mg; Exercise goal of 150 minutes per week; Importance of home blood pressure monitoring; Symptoms of hypotension and importance of maintaining adequate hydration; -Counseled to monitor BP at home weekly, document, and provide log at future appointments -Recommended to continue current medication Recommended he try switching Diltiazem to night time dosing to see if splitting up his daytime BP meds helps with daytime sleepiness.  Hyperlipidemia: (LDL goal < 70) -Controlled -Current treatment: Atorvastatin '40mg'$  daily -Medications previously tried: none noted  -Educated on Cholesterol goals;  Benefits of statin for ASCVD risk reduction; Importance of limiting foods high in cholesterol; Exercise goal of 150 minutes per week; Strategies to manage statin-induced myalgias; -Most recent LDL is excellent - patient is concerned his tiredness may be coming from statin as well as  memory loss.  Stressed importance of these medications on reducing cardiovascular events, he is agreeable to continue. -Recommended to continue current medication Patient interested in decreasing dose and plans to follow up with cardiology on this.    Depression/Anxiety (Goal: Minimize symptoms) -Controlled -Current treatment: Venlafaxine ER '150mg'$  daily Clonazepam 0.'5mg'$ m prn -Medications previously tried/failed: Lexapro -PHQ9:  [place] Office Visit from 11/03/2017 in [place]  PHQ-9 Total Score  11     -GAD7: No flowsheet data found. -Educated on Benefits of medication for symptom control -Patient admits there still could be symptoms of depression, ie sleeping all day and late in the morning.  Wants to try exercise to see if this helps. -Recommended to continue current medication Encouraged him to let me know or come see Dr. Dennard Schaumann if symptoms worsen or do not improve with exercise.  Insomnia (Goal: Reduce symptoms) -Controlled -Current treatment  None noted -Medications previously tried: none -No reports of sleeping disturbances, in fact he sleeps too much per his wife and his own reports -Could be some untreated depression - patient aware to follow up if it does not improve  -Recommended continue current management strategies  Patient Goals/Self-Care Activities Patient will:  - take medications as prescribed focus on medication adherence by pill box check blood pressure a few times per week, document, and provide at future appointments  Follow Up Plan: The care management team will reach out to the patient again over the next 120 days.       [name] was given information about Chronic Care Management services today including:  CCM service includes personalized support from designated clinical staff supervised by his physician, including individualized plan of care and coordination with other care providers 24/7 contact phone numbers for assistance for urgent and routine care needs. Standard insurance, coinsurance, copays and deductibles apply for chronic care management only during months in which we provide at least 20 minutes of these services. Most insurances cover these services at 100%, however patients may be responsible for any copay, coinsurance and/or deductible if applicable. This service may help you avoid the need for more expensive face-to-face services. Only one practitioner may furnish and bill the service in a calendar month. The patient may stop CCM  services at any time (effective at the end of the month) by phone call to the office staff.  Patient agreed to services and verbal consent obtained.   The patient verbalized understanding of instructions, educational materials, and care plan provided today and agreed to receive a mailed copy of patient instructions, educational materials, and care plan.  Telephone follow up appointment with pharmacy team member scheduled for: 4 months  Edythe Clarity, [place]

## 2021-06-22 DIAGNOSIS — G5622 Lesion of ulnar nerve, left upper limb: Secondary | ICD-10-CM | POA: Diagnosis not present

## 2021-06-22 DIAGNOSIS — G5602 Carpal tunnel syndrome, left upper limb: Secondary | ICD-10-CM | POA: Diagnosis not present

## 2021-06-22 DIAGNOSIS — H5203 Hypermetropia, bilateral: Secondary | ICD-10-CM | POA: Diagnosis not present

## 2021-06-22 DIAGNOSIS — H52221 Regular astigmatism, right eye: Secondary | ICD-10-CM | POA: Diagnosis not present

## 2021-06-22 DIAGNOSIS — H3509 Other intraretinal microvascular abnormalities: Secondary | ICD-10-CM | POA: Diagnosis not present

## 2021-06-22 DIAGNOSIS — H35033 Hypertensive retinopathy, bilateral: Secondary | ICD-10-CM | POA: Diagnosis not present

## 2021-06-22 DIAGNOSIS — H43813 Vitreous degeneration, bilateral: Secondary | ICD-10-CM | POA: Diagnosis not present

## 2021-06-22 DIAGNOSIS — E7800 Pure hypercholesterolemia, unspecified: Secondary | ICD-10-CM | POA: Diagnosis not present

## 2021-06-22 DIAGNOSIS — H35032 Hypertensive retinopathy, left eye: Secondary | ICD-10-CM | POA: Diagnosis not present

## 2021-06-22 DIAGNOSIS — H35031 Hypertensive retinopathy, right eye: Secondary | ICD-10-CM | POA: Diagnosis not present

## 2021-06-22 DIAGNOSIS — H2513 Age-related nuclear cataract, bilateral: Secondary | ICD-10-CM | POA: Diagnosis not present

## 2021-06-22 DIAGNOSIS — H524 Presbyopia: Secondary | ICD-10-CM | POA: Diagnosis not present

## 2021-06-22 DIAGNOSIS — I10 Essential (primary) hypertension: Secondary | ICD-10-CM | POA: Diagnosis not present

## 2021-06-28 ENCOUNTER — Telehealth: Payer: Self-pay | Admitting: Pharmacist

## 2021-06-28 NOTE — Progress Notes (Addendum)
    Chronic Care Management Pharmacy Assistant   Name: Walter Cross  MRN: [id number] DOB: 02-26-1951  Reason for Encounter: CCM Care plan  Medications: Outpatient Encounter Medications as of 06/28/2021  Medication Sig   acyclovir ointment (ZOVIRAX) 5 % Apply 1 application topically daily as needed (cold sores & herpes).   atorvastatin (LIPITOR) 40 MG tablet Take 1 tablet (40 mg total) by mouth daily.   carvedilol (COREG) 12.5 MG tablet TAKE 1 TABLET(12.5 MG) BY MOUTH TWICE DAILY WITH A MEAL   Cholecalciferol (VITAMIN D) 50 MCG (2000 UT) CAPS Take 1 capsule by mouth daily in the afternoon.   clonazePAM (KLONOPIN) 0.5 MG tablet TAKE 1 TABLET(0.5 MG) BY MOUTH AT BEDTIME AS NEEDED FOR ANXIETY OR SLEEP   clopidogrel (PLAVIX) 75 MG tablet TAKE 1 TABLET(75 MG) BY MOUTH DAILY   Cyanocobalamin (VITAMIN B-12) 1000 MCG SUBL Place 1 tablet under the tongue daily.    diltiazem (CARTIA XT) 180 MG 24 hr capsule TAKE 1 CAPSULE(180 MG) BY MOUTH DAILY   folic acid (FOLVITE) 1 MG tablet Take 2 tablets (2 mg total) by mouth daily.   ibuprofen (ADVIL,MOTRIN) 200 MG tablet Take 200 mg by mouth every 4 (four) hours as needed for headache, mild pain or moderate pain.    nystatin-triamcinolone (MYCOLOG II) cream Apply 1 application topically as needed.   pantoprazole (PROTONIX) 40 MG tablet Take 1 tablet (40 mg total) by mouth daily.   predniSONE (DELTASONE) 20 MG tablet 3 tabs poqday 1-2, 2 tabs poqday 3-4, 1 tab poqday 5-6 (Patient not taking: Reported on 06/18/2021)   ramipril (ALTACE) 10 MG capsule TAKE 1 CAPSULE(10 MG) BY MOUTH DAILY   sildenafil (REVATIO) 20 MG tablet 3-5 tablets daily as needed   tamsulosin (FLOMAX) 0.4 MG CAPS capsule Take 0.4 mg by mouth at bedtime.   venlafaxine XR (EFFEXOR-XR) 150 MG 24 hr capsule TAKE 1 CAPSULE(150 MG) BY MOUTH DAILY WITH BREAKFAST   No facility-administered encounter medications on file as of 06/28/2021.   Reviewed the patients initial visit reinsured it was  completed per the pharmacist Leata Mouse request. Printed the CCM care plan. Mailed the patient CCM care plan to their most recent address on file.  Follow-Up:Pharmacist Review  Charlann Lange, [place] Pharmacist Assistant [phone]

## 2021-06-29 DIAGNOSIS — L570 Actinic keratosis: Secondary | ICD-10-CM | POA: Diagnosis not present

## 2021-06-29 DIAGNOSIS — L905 Scar conditions and fibrosis of skin: Secondary | ICD-10-CM | POA: Diagnosis not present

## 2021-06-29 DIAGNOSIS — L814 Other melanin hyperpigmentation: Secondary | ICD-10-CM | POA: Diagnosis not present

## 2021-06-29 DIAGNOSIS — Z872 Personal history of diseases of the skin and subcutaneous tissue: Secondary | ICD-10-CM | POA: Diagnosis not present

## 2021-06-29 DIAGNOSIS — D225 Melanocytic nevi of trunk: Secondary | ICD-10-CM | POA: Diagnosis not present

## 2021-06-29 DIAGNOSIS — D485 Neoplasm of uncertain behavior of skin: Secondary | ICD-10-CM | POA: Diagnosis not present

## 2021-06-29 DIAGNOSIS — L820 Inflamed seborrheic keratosis: Secondary | ICD-10-CM | POA: Diagnosis not present

## 2021-06-29 DIAGNOSIS — L821 Other seborrheic keratosis: Secondary | ICD-10-CM | POA: Diagnosis not present

## 2021-07-07 ENCOUNTER — Encounter: Payer: Self-pay | Admitting: Family Medicine

## 2021-07-07 ENCOUNTER — Other Ambulatory Visit: Payer: Self-pay | Admitting: Family Medicine

## 2021-07-07 DIAGNOSIS — F321 Major depressive disorder, single episode, moderate: Secondary | ICD-10-CM

## 2021-07-08 MED ORDER — CLONAZEPAM 0.5 MG PO TABS: ORAL_TABLET | ORAL | 1 refills | Status: DC

## 2021-07-08 NOTE — Telephone Encounter (Signed)
Ok to refill??  Last office visit 05/14/2021.  Last refill 01/25/2021.  Ok to add refill to prescription?

## 2021-07-12 ENCOUNTER — Encounter: Payer: Self-pay | Admitting: Family Medicine

## 2021-07-12 MED ORDER — CLOPIDOGREL BISULFATE 75 MG PO TABS: ORAL_TABLET | ORAL | 0 refills | Status: DC

## 2021-08-13 ENCOUNTER — Telehealth: Payer: Self-pay | Admitting: Pharmacist

## 2021-08-13 NOTE — Progress Notes (Addendum)
Chronic Care Management Pharmacy Assistant   Name: Walter Cross  MRN: [id number] DOB: 08-26-1951  Reason for Encounter: General Disease State Call   Conditions to be addressed/monitored: HTN, HX of TIA, Depression/Anxiety, HLD, Insomnia  Recent office visits:  None since 06/28/21  Recent consult visits:  None since 06/28/21  Hospital visits:  None since 06/28/21  Medications: Outpatient Encounter Medications as of 08/13/2021  Medication Sig   acyclovir ointment (ZOVIRAX) 5 % Apply 1 application topically daily as needed (cold sores & herpes).   atorvastatin (LIPITOR) 40 MG tablet Take 1 tablet (40 mg total) by mouth daily.   carvedilol (COREG) 12.5 MG tablet TAKE 1 TABLET(12.5 MG) BY MOUTH TWICE DAILY WITH A MEAL   Cholecalciferol (VITAMIN D) 50 MCG (2000 UT) CAPS Take 1 capsule by mouth daily in the afternoon.   clonazePAM (KLONOPIN) 0.5 MG tablet TAKE 1 TABLET(0.5 MG) BY MOUTH AT BEDTIME AS NEEDED FOR ANXIETY OR SLEEP   clopidogrel (PLAVIX) 75 MG tablet TAKE 1 TABLET(75 MG) BY MOUTH DAILY   Cyanocobalamin (VITAMIN B-12) 1000 MCG SUBL Place 1 tablet under the tongue daily.    diltiazem (CARTIA XT) 180 MG 24 hr capsule TAKE 1 CAPSULE(180 MG) BY MOUTH DAILY   folic acid (FOLVITE) 1 MG tablet Take 2 tablets (2 mg total) by mouth daily.   ibuprofen (ADVIL,MOTRIN) 200 MG tablet Take 200 mg by mouth every 4 (four) hours as needed for headache, mild pain or moderate pain.    nystatin-triamcinolone (MYCOLOG II) cream Apply 1 application topically as needed.   pantoprazole (PROTONIX) 40 MG tablet Take 1 tablet (40 mg total) by mouth daily.   predniSONE (DELTASONE) 20 MG tablet 3 tabs poqday 1-2, 2 tabs poqday 3-4, 1 tab poqday 5-6 (Patient not taking: Reported on 06/18/2021)   ramipril (ALTACE) 10 MG capsule TAKE 1 CAPSULE(10 MG) BY MOUTH DAILY   sildenafil (REVATIO) 20 MG tablet 3-5 tablets daily as needed   tamsulosin (FLOMAX) 0.4 MG CAPS capsule Take 0.4 mg by mouth at bedtime.    venlafaxine XR (EFFEXOR-XR) 150 MG 24 hr capsule TAKE 1 CAPSULE(150 MG) BY MOUTH DAILY WITH BREAKFAST   No facility-administered encounter medications on file as of 08/13/2021.   GEN CALL:  Patient stated he doesn't see any changes. He stated he believes he needs to see the doctor to make some changes in his medications. I asked the patient PHQ-9 questions.   Little interest or pleasure in doing things? Patient stated Several Days Feeling down, depressed, or hopeless? Patient stated Several Days Trouble falling or staying asleep, or sleeping too much? Patient stated Several Days Feeling tired or having little energy? Patient stated Several Days Poor appetite or overeating? Patient stated Not at all. Feeling bad about yourself -- or that you are a failure or have let yourself or your family down? Patient stated Not at all. Trouble concentrating on things, such as reading the newspaper or watching television? Patient stated Not at all. Moving or speaking so slowly that other people could have noticed? Or so fidgety or restless that you have been moving a lot more than usual? Patient stated Not at all. Thoughts that you would be better off dead, or thoughts of hurting yourself in some way? Patient stated Not at all. Ask the patient: how difficult have these problems made it to do work, take care of things at home, or get along with other people? Patient stated Somewhat Difficult   Care Gaps:Patient blood pressure is uncontrolled.  Star Rating Drugs:Atorvastatin 40 mg 06/11/21 90 DS.   Follow-Up:Pharmacist Review  Charlann Lange, RMA Clinical Pharmacist Assistant [phone]  10 minutes spent in review, coordination, and documentation.  Reviewed PHQ-9 which resulted in total score of 4 (normal) will continue to follow and assess at future follow up visits.  Reviewed by: Beverly Milch, PharmD Clinical Pharmacist [phone]

## 2021-08-16 DIAGNOSIS — K22711 Barrett's esophagus with high grade dysplasia: Secondary | ICD-10-CM | POA: Diagnosis not present

## 2021-08-16 DIAGNOSIS — N400 Enlarged prostate without lower urinary tract symptoms: Secondary | ICD-10-CM | POA: Diagnosis not present

## 2021-08-16 DIAGNOSIS — Z79899 Other long term (current) drug therapy: Secondary | ICD-10-CM | POA: Diagnosis not present

## 2021-08-16 DIAGNOSIS — F32A Depression, unspecified: Secondary | ICD-10-CM | POA: Diagnosis not present

## 2021-08-16 DIAGNOSIS — E785 Hyperlipidemia, unspecified: Secondary | ICD-10-CM | POA: Diagnosis not present

## 2021-08-16 DIAGNOSIS — Z882 Allergy status to sulfonamides status: Secondary | ICD-10-CM | POA: Diagnosis not present

## 2021-08-16 DIAGNOSIS — K449 Diaphragmatic hernia without obstruction or gangrene: Secondary | ICD-10-CM | POA: Diagnosis not present

## 2021-08-16 DIAGNOSIS — K219 Gastro-esophageal reflux disease without esophagitis: Secondary | ICD-10-CM | POA: Diagnosis not present

## 2021-08-16 DIAGNOSIS — Z87891 Personal history of nicotine dependence: Secondary | ICD-10-CM | POA: Diagnosis not present

## 2021-08-16 DIAGNOSIS — I10 Essential (primary) hypertension: Secondary | ICD-10-CM | POA: Diagnosis not present

## 2021-08-16 DIAGNOSIS — Z8673 Personal history of transient ischemic attack (TIA), and cerebral infarction without residual deficits: Secondary | ICD-10-CM | POA: Diagnosis not present

## 2021-08-16 DIAGNOSIS — Z7902 Long term (current) use of antithrombotics/antiplatelets: Secondary | ICD-10-CM | POA: Diagnosis not present

## 2021-08-20 DIAGNOSIS — Z125 Encounter for screening for malignant neoplasm of prostate: Secondary | ICD-10-CM | POA: Diagnosis not present

## 2021-08-20 DIAGNOSIS — R351 Nocturia: Secondary | ICD-10-CM | POA: Diagnosis not present

## 2021-08-20 DIAGNOSIS — N401 Enlarged prostate with lower urinary tract symptoms: Secondary | ICD-10-CM | POA: Diagnosis not present

## 2021-09-01 ENCOUNTER — Other Ambulatory Visit: Payer: Self-pay

## 2021-09-01 ENCOUNTER — Ambulatory Visit (INDEPENDENT_AMBULATORY_CARE_PROVIDER_SITE_OTHER): Payer: Medicare Other | Admitting: Adult Health

## 2021-09-01 ENCOUNTER — Encounter: Payer: Self-pay | Admitting: Adult Health

## 2021-09-01 VITALS — BP 152/84 | HR 84 | Ht 69.0 in | Wt 175.0 lb

## 2021-09-01 DIAGNOSIS — G459 Transient cerebral ischemic attack, unspecified: Secondary | ICD-10-CM | POA: Diagnosis not present

## 2021-09-01 NOTE — Progress Notes (Signed)
[facility] [address] [place]. [place] [address] [phone]       STROKE FOLLOW UP NOTE  Walter Cross Date of Birth:  05/01/1951 Medical Record Number:  [id number]   Reason for Referral: stroke follow up    SUBJECTIVE:   CHIEF COMPLAINT:  Chief Complaint  Patient presents with   Transient Ischemic Attack    Rm 3, 6 month FU, here alone  "no new concerns"     HPI:   Update 09/01/2021 JM: returns for TIA follow up. Overall doing well. Denies new stroke/TIA symptoms. Remains on plavix - was having issues with nosebleeds back in June but not recent isues. Remains on atorvastatin tolerating without side effects. Blood pressure today 152/84 - routinely monitors at home and has been similar. [place] with PCP for management. LDL 36 (02/2021). No new concerns at this time.     History provided for reference purposes only  Update 02/24/2021 JM: Walter Cross returns for 45-month TIA follow-up unaccompanied  Stable since prior visit without new or recurring stroke/TIA symptoms Completed 3 months DAPT and remains on Plavix alone -denies side effects Compliant on atorvastatin 40 mg daily -denies associated side effects Blood pressure today 147/84 -monitors at home and typically 140-150s/80s Lipid panel 12/04/2020 LDL 120 but apparently ran out of atorvastatin for a few weeks prior to lab work - plans on repeating in April Cardiac event monitor negative for arrhythmia or A. fib  Continues on folic acid and Y19 supplement for B12 and folate deficiency - has not had repeat levels since starting J09 and folic acid  No new concerns at this time  Initial visit 10/27/2020 JM: Walter Cross is being seen for hospital follow-up unaccompanied.  Stable since discharge without new or reoccurring symptoms. Denies residual deficits. Remains on DAPT with mild bruising but no bleeding.  Continues on atorvastatin without myalgias. Blood pressure today 158/82 which is  patient's baseline. He is currently wearing heart monitor which will be completed at the end of this week.  At hospital discharge, he was advised to continue folic acid 1 mg x2 tabs daily only supplied with 30 tablets.  He is requesting a refill.  He also remains on B12 supplement.  No further concerns at this time  Stroke admission 09/25/2020 Walter Cross is a 70 y.o. male with history of previous HTN, HLD, cardiomyopathy, and L4-5 surgery  who presented on 09/25/2020 with transient sudden right sided numbness and difficulty putting his glasses in his pocket.   Personally reviewed hospitalization pertinent progress notes, lab work and imaging with summary provided.  Stroke work-up largely unremarkable with MRI negative for acute abnormality and symptoms possibly TIA of unclear etiology.  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  Recommended 30-day cardiac event monitor outpatient to assess for atrial fibrillation.  Recommended DAPT for 3 months then Plavix alone.  HTN stable.  LDL 89 and initiate atorvastatin 40 mg daily. 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  Other stroke risk factors include advanced age, former tobacco use, EtOH use and family history of stroke and atrial fibrillation (brother).  Evaluated by therapy and discharged home in stable condition without therapy needs.  TIA - etiology unclear CT Head -  No acute intracranial process.  MRI head - No acute intracranial abnormality.  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 Normal MRA of the neck.  2D Echo - EF 60 - 65%. No cardiac source of emboli  identified.  30 day cardiac event monitoring as outpt to rule out afib [employer] Virus 2 - negative LDL - 89 HgbA1c - 5.5 UDS - negative ETH - 176 VTE prophylaxis - Lovenox aspirin 81 mg daily prior to admission, now on aspirin 325 mg daily and clopidogrel 75 mg daily DAPT for 3 months and then plavix alone. Patient counseled to be  compliant with his antithrombotic medications Ongoing aggressive stroke risk factor management Therapy recommendations: none Disposition:  home      ROS:   14 system review of systems performed and negative with exception of no complaints  PMH:  Past Medical History:  Diagnosis Date   Adenomatous colon polyp 2015    Buccini (q 5 yrs)    Barrett's esophagus    Benign colon polyp    Depression    Dermatitis    axillary   Exposure to asbestos    auto brake liners   Genital HSV    GERD (gastroesophageal reflux disease)    Hx of cardiovascular stress test    ETT-Myoview 9/13: no ischemia, EF 81%   Hyperlipidemia    Hypertension    Hypertrophic cardiomyopathy ([place])    a. Echo 2007: mod LVH, diast dysfn, mild MR, no LVOT obstruction;  b. echo 9/13: upper septal thickening, no SAM of MV, no LVOT gradient, mild LVH, EF 65%   TIA (transient ischemic attack)    Vitamin D deficiency     PSH:  Past Surgical History:  Procedure Laterality Date   CERVICAL SPINE SURGERY  1992-1993   nerve release, Dr Hal Neer   SPINE SURGERY     L4-L5, [phone]    Social History:  Social History   Socioeconomic History   Marital status: Widowed    Spouse name: Not on file   Number of children: Not on file   Years of education: Not on file   Highest education level: Not on file  Occupational History   Occupation: Radio producer    Employer: [employer]  Tobacco Use   Smoking status: Former    Packs/day: 1.00    Years: 7.00    Pack years: 7.00    Types: Cigarettes    Quit date: 11/29/1975    Years since quitting: 45.7   Smokeless tobacco: Never  Vaping Use   Vaping Use: Never used  Substance and Sexual Activity   Alcohol use: Yes    Alcohol/week: 2.0 standard drinks    Types: 2 Standard drinks or equivalent per week   Drug use: No   Sexual activity: Not on file  Other Topics Concern   Not on file  Social History Narrative   Not on file   Social  Determinants of Health   Financial Resource Strain: Low Risk    Difficulty of Paying Living Expenses: Not hard at all  Food Insecurity: No Food Insecurity   Worried About Charity fundraiser in the Last Year: Never true   [place] in the Last Year: Never true  Transportation Needs: No Transportation Needs   Lack of Transportation (Medical): No   Lack of Transportation (Non-Medical): No  Physical Activity: Inactive   Days of Exercise per Week: 0 days   Minutes of Exercise per Session: 0 min  Stress: No Stress Concern Present   Feeling of Stress : Not at all  Social Connections: Socially Isolated   Frequency of Communication with Friends and Family: Three times a week   Frequency of Social Gatherings  with Friends and Family: Once a week   Attends Religious Services: Never   Marine scientist or Organizations: No   Attends Archivist Meetings: Never   Marital Status: Widowed  Human resources officer Violence: Not At Risk   Fear of Current or Ex-Partner: No   Emotionally Abused: No   Physically Abused: No   Sexually Abused: No    Family History:  Family History  Problem Relation Age of Onset   Cancer Mother 81       cervical   Cancer Father        lung   Stroke Brother    Atrial fibrillation Other     Medications:   Current Outpatient Medications on File Prior to Visit  Medication Sig Dispense Refill   acyclovir ointment (ZOVIRAX) 5 % Apply 1 application topically daily as needed (cold sores & herpes). 15 g 1   atorvastatin (LIPITOR) 40 MG tablet Take 1 tablet (40 mg total) by mouth daily. 90 tablet 3   carvedilol (COREG) 12.5 MG tablet TAKE 1 TABLET(12.5 MG) BY MOUTH TWICE DAILY WITH A MEAL 180 tablet 3   Cholecalciferol (VITAMIN D) 50 MCG (2000 UT) CAPS Take 1 capsule by mouth daily in the afternoon.     clonazePAM (KLONOPIN) 0.5 MG tablet TAKE 1 TABLET(0.5 MG) BY MOUTH AT BEDTIME AS NEEDED FOR ANXIETY OR SLEEP 30 tablet 1   clopidogrel (PLAVIX) 75 MG  tablet TAKE 1 TABLET(75 MG) BY MOUTH DAILY 90 tablet 0   Cyanocobalamin (VITAMIN B-12) 1000 MCG SUBL Place 1 tablet under the tongue daily.      diltiazem (CARTIA XT) 180 MG 24 hr capsule TAKE 1 CAPSULE(180 MG) BY MOUTH DAILY 90 capsule 3   folic acid (FOLVITE) 1 MG tablet Take 2 tablets (2 mg total) by mouth daily. 60 tablet 3   ibuprofen (ADVIL,MOTRIN) 200 MG tablet Take 200 mg by mouth every 4 (four) hours as needed for headache, mild pain or moderate pain.      nystatin-triamcinolone (MYCOLOG II) cream Apply 1 application topically as needed.     pantoprazole (PROTONIX) 40 MG tablet Take 1 tablet (40 mg total) by mouth daily. 90 tablet 1   predniSONE (DELTASONE) 20 MG tablet 3 tabs poqday 1-2, 2 tabs poqday 3-4, 1 tab poqday 5-6 12 tablet 0   ramipril (ALTACE) 10 MG capsule TAKE 1 CAPSULE(10 MG) BY MOUTH DAILY 90 capsule 3   sildenafil (REVATIO) 20 MG tablet 3-5 tablets daily as needed 50 tablet 6   tamsulosin (FLOMAX) 0.4 MG CAPS capsule Take 0.4 mg by mouth at bedtime.     venlafaxine XR (EFFEXOR-XR) 150 MG 24 hr capsule TAKE 1 CAPSULE(150 MG) BY MOUTH DAILY WITH BREAKFAST 90 capsule 3   No current facility-administered medications on file prior to visit.    Allergies:   Allergies  Allergen Reactions   Sulfa Antibiotics     Pt reports causes aches all over   Esomeprazole Magnesium Other (See Comments)    cramps   Septra [Bactrim] Other (See Comments)    Back ache   Sulfamethoxazole-Trimethoprim     Other reaction(s): Other (See Comments) Back ache Back ache Back ache       OBJECTIVE:  Physical Exam  Vitals:   09/01/21 1535  BP: (!) 152/84  Pulse: 84  Weight: 175 lb (79.4 kg)  Height: 5\' 9"  (1.753 m)   Body mass index is 25.84 kg/m. No results found.  General: well developed, well nourished, very pleasant elderly  Caucasian male, seated, in no evident distress Head: head normocephalic and atraumatic.   Neck: supple with no carotid or supraclavicular  bruits Cardiovascular: regular rate and rhythm, no murmurs Musculoskeletal: no deformity Skin:  no rash/petichiae Vascular:  Normal pulses all extremities   Neurologic Exam Mental Status: Awake and fully alert.   Fluent speech and language.  Oriented to place and time. Recent and remote memory intact. Attention span, concentration and fund of knowledge appropriate. Mood and affect appropriate.  Cranial Nerves: Pupils equal, briskly reactive to light. Extraocular movements full without nystagmus. Visual fields full to confrontation. Hearing intact. Facial sensation intact. Face, tongue, palate moves normally and symmetrically.  Motor: Normal bulk and tone. Normal strength in all tested extremity muscles. Sensory.: intact to touch , pinprick , position and vibratory sensation.  Coordination: Rapid alternating movements normal in all extremities. Finger-to-nose and heel-to-shin performed accurately bilaterally. Gait and Station: Arises from chair without difficulty. Stance is normal. Gait demonstrates normal stride length and balance without use of assistive device. Reflexes: 1+ and symmetric. Toes downgoing.         ASSESSMENT: Walter Cross is a 70 y.o. year old male presented with transient sudden right-sided numbness and difficulty putting his glasses in his pocket on 09/25/2020 likely in setting of TIA secondary to unclear source. Vascular risk factors include HTN, HLD, cardiomyopathy, FA deficiency, former tobacco use and EtOH use.      PLAN:  TIA :   Stable without new stroke/TIA symptoms Continue clopidogrel 75 mg daily  and atorvastatin for secondary stroke prevention.   Cardiac monitor negative for atrial fibrillation Discussed secondary stroke prevention measures and importance of close PCP follow up for aggressive stroke risk factor management  HTN: BP goal <130/90.  Stable on high and which is patient's baseline.  Monitored routinely by cardiology. HLD: LDL goal <70.   On atorvastatin 40 mg daily with prior LDL 36 (02/2021) B12 and FA deficiency: Continue folic acid and D22 supplement     Overall stable from stroke standpoint routinely followed by PCP and cardiology for aggressive stroke risk factor management and recommend follow-up on an as-needed basis which patient agreed with   CC:  Susy Frizzle, MD    I spent 26 minutes of face-to-face and non-face-to-face time with patient.  This included previsit chart review, study review, order entry, electronic health record documentation, patient education regarding prior TIA, indication for ongoing use of folic acid with FA deficiency and b12 for b12 deficiency, importance of managing stroke risk factors and secondary stroke prevention measures and answered all other questions to patient satisfaction  Frann Rider, AGNP-BC  [facility] [address] [place], [place] [address]  Phone [phone] Fax [phone] Note: This document was prepared with digital dictation and possible smart phrase technology. Any transcriptional errors that result from this process are unintentional.

## 2021-09-09 ENCOUNTER — Other Ambulatory Visit: Payer: Self-pay | Admitting: Cardiovascular Disease

## 2021-09-09 ENCOUNTER — Other Ambulatory Visit: Payer: Self-pay | Admitting: Family Medicine

## 2021-10-08 ENCOUNTER — Other Ambulatory Visit: Payer: Self-pay | Admitting: Family Medicine

## 2021-10-09 DIAGNOSIS — Z23 Encounter for immunization: Secondary | ICD-10-CM | POA: Diagnosis not present

## 2021-10-28 ENCOUNTER — Ambulatory Visit (INDEPENDENT_AMBULATORY_CARE_PROVIDER_SITE_OTHER): Payer: Medicare Other | Admitting: Pharmacist

## 2021-10-28 DIAGNOSIS — I10 Essential (primary) hypertension: Secondary | ICD-10-CM

## 2021-10-28 DIAGNOSIS — F32A Depression, unspecified: Secondary | ICD-10-CM

## 2021-10-28 NOTE — Progress Notes (Signed)
Chronic Care Management Pharmacy Note  10/28/2021 Name:  Walter Cross MRN:  [id number] DOB:  Sep 16, 1951  Summary: PharmD follow up.  Patient may still have lingering depression.  Doesn't feel motivated to do things, also does not feel like meds work like they used to.  BP seems to be uncontrolled systolic in 631S.  Have asked him to check at home  Recommendations/Changes made from today's visit: Fu with PCP - consider zoloft or wellbutrin Fu BP in 2 weeks - may need to ass HCTZ  Plan: FU 4 months   Subjective: Walter Cross is an 70 y.o. year old male who is a primary patient of Pickard, Cammie Mcgee, MD.  The CCM team was consulted for assistance with disease management and care coordination needs.    Engaged with patient face to face for initial visit in response to provider referral for pharmacy case management and/or care coordination services.   Consent to Services:  The patient was given the following information about Chronic Care Management services today, agreed to services, and gave verbal consent: 1. CCM service includes personalized support from designated clinical staff supervised by the primary care provider, including individualized plan of care and coordination with other care providers 2. 24/7 contact phone numbers for assistance for urgent and routine care needs. 3. Service will only be billed when office clinical staff spend 20 minutes or more in a month to coordinate care. 4. Only one practitioner may furnish and bill the service in a calendar month. 5.The patient may stop CCM services at any time (effective at the end of the month) by phone call to the office staff. 6. The patient will be responsible for cost sharing (co-pay) of up to 20% of the service fee (after annual deductible is met). Patient agreed to services and consent obtained.  Patient Care Team: Susy Frizzle, MD as PCP - General (Family Medicine) Nahser, [name], MD as PCP - Cardiology  (Cardiology) Edythe Clarity, [facility] as Pharmacist (Pharmacist)  Recent office visits:  05/14/21 Walter Bear, NP. For long term use of anticoagulants. No medication changes. Per note:Plan to resume Plavix indefinitely for history of TIA. 01/12/21 Dr. Dennard Schaumann For follow-up. No medication changes. Per note: The doctor recommended that he switch to Plavix 75 mg daily alone and discontinue the aspirin as recommended by his neurologist.   Recent consult visits:  06/14/21 Cardiology Nahser, Arnette Norris - no changes to meds, lipids well controlled.  BP slightly elevated he wants to fix with diet/exercise before adding any medications.  02/24/21 Neurology Frann Rider, NP. For follow-up. STOPPED Aspirin.   Hospital visits:  05/12/21 [facility] Emergency Department (5 Hours) Lacretia Leigh, MD. For epistaxis. Per note: EKG and labs preformed.   Medication History: Ramipril 10 mg 90 DS 03/16/21 Atorvastatin 40 mg 90 DS 03/13/21   Objective:  Lab Results  Component Value Date   CREATININE 0.78 05/12/2021   BUN 23 05/12/2021   GFR 96.72 04/16/2013   GFRNONAA >60 05/12/2021   GFRAA 100 12/04/2020   NA 138 05/12/2021   K 3.7 05/12/2021   CALCIUM 9.3 05/12/2021   CO2 24 05/12/2021   GLUCOSE 124 (H) 05/12/2021    Lab Results  Component Value Date/Time   HGBA1C 5.5 09/26/2020 03:06 AM   HGBA1C 5.8 (H) 03/17/2015 09:56 AM   GFR 96.72 04/16/2013 09:21 AM    Last diabetic Eye exam: No results found for: HMDIABEYEEXA  Last diabetic Foot exam: No results found for:  HMDIABFOOTEX   Lab Results  Component Value Date   CHOL 135 03/08/2021   HDL 80 03/08/2021   LDLCALC 36 03/08/2021   LDLDIRECT 115.6 04/16/2013   TRIG 109 03/08/2021   CHOLHDL 1.7 03/08/2021    Hepatic Function Latest Ref Rng & Units 03/08/2021 12/04/2020 09/25/2020  Total Protein 6.0 - 8.5 g/dL 6.6 7.0 6.7  Albumin 3.7 - 4.7 g/dL 4.4 4.3 3.8  AST 0 - 40 IU/L _0 ALT 0 - 44 IU/L _1 Alk  Phosphatase 44 - 121 IU/L 91 90 64  Total Bilirubin 0.0 - 1.2 mg/dL 0.5 0.8 0.8  Bilirubin, Direct 0.00 - 0.40 mg/dL 0.20 0.27 -    Lab Results  Component Value Date/Time   TSH 1.48 03/24/2017 09:59 AM   TSH 2.402 03/11/2014 09:40 AM    CBC Latest Ref Rng & Units 05/12/2021 09/25/2020 09/25/2020  WBC 4.0 - 10.5 K/uL 6.9 - 6.9  Hemoglobin 13.0 - 17.0 g/dL 14.9 15.6 15.9  Hematocrit 39.0 - 52.0 % 42.8 46.0 46.0  Platelets 150 - 400 K/uL 251 - 236    No results found for: VD25OH  Clinical ASCVD: No  The 10-year ASCVD risk score (Arnett DK, et al., 2019) is: 20.4%   Values used to calculate the score:     Age: 70 years     Sex: Male     Is Non-Hispanic African American: No     Diabetic: No     Tobacco smoker: No     Systolic Blood Pressure: 160 mmHg     Is BP treated: Yes     HDL Cholesterol: 80 mg/dL     Total Cholesterol: 135 mg/dL    Depression screen [facility] 2/9 05/14/2021 04/09/2021 10/27/2020  Decreased Interest 0 0 0  Down, Depressed, Hopeless 0 0 0  PHQ - 2 Score 0 0 0  Altered sleeping - - -  Tired, decreased energy - - -  Change in appetite - - -  Feeling bad or failure about yourself  - - -  Trouble concentrating - - -  Moving slowly or fidgety/restless - - -  Suicidal thoughts - - -  PHQ-9 Score - - -  Difficult doing work/chores - - -     Social History   Tobacco Use  Smoking Status Former   Packs/day: 1.00   Years: 7.00   Pack years: 7.00   Types: Cigarettes   Quit date: 11/29/1975   Years since quitting: 45.9  Smokeless Tobacco Never   BP Readings from Last 3 Encounters:  09/01/21 (!) 152/84  06/14/21 (!) 142/78  05/14/21 118/60   Pulse Readings from Last 3 Encounters:  09/01/21 84  06/14/21 77  05/14/21 94   Wt Readings from Last 3 Encounters:  09/01/21 175 lb (79.4 kg)  06/14/21 174 lb 3.2 oz (79 kg)  05/14/21 172 lb 3.2 oz (78.1 kg)   BMI Readings from Last 3 Encounters:  09/01/21 25.84 kg/m  06/14/21 25.72 kg/m  05/14/21 25.43  kg/m    Assessment/Interventions: Review of patient past medical history, allergies, medications, health status, including review of consultants reports, laboratory and other test data, was performed as part of comprehensive evaluation and provision of chronic care management services.   SDOH:  (Social Determinants of Health) assessments and interventions performed: Yes  Financial Resource Strain: Low Risk    Difficulty of Paying Living Expenses: Not hard at all    SDOH Screenings   Alcohol Screen: Low Risk  Last Alcohol Screening Score (AUDIT): 5  Depression (PHQ2-9): Low Risk    PHQ-2 Score: 0  Financial Resource Strain: Low Risk    Difficulty of Paying Living Expenses: Not hard at all  Food Insecurity: No Food Insecurity   Worried About Charity fundraiser in the Last Year: Never true   Ran Out of Food in the Last Year: Never true  Housing: Low Risk    Last Housing Risk Score: 0  Physical Activity: Inactive   Days of Exercise per Week: 0 days   Minutes of Exercise per Session: 0 min  Social Connections: Socially Isolated   Frequency of Communication with Friends and Family: Three times a week   Frequency of Social Gatherings with Friends and Family: Once a week   Attends Religious Services: Never   Marine scientist or Organizations: No   Attends Archivist Meetings: Never   Marital Status: Widowed  Stress: No Stress Concern Present   Feeling of Stress : Not at all  Tobacco Use: Medium Risk   Smoking Tobacco Use: Former   Smokeless Tobacco Use: Never   Passive Exposure: Not on file  Transportation Needs: No Transportation Needs   Lack of Transportation (Medical): No   Lack of Transportation (Non-Medical): No    CCM Care Plan  Allergies  Allergen Reactions   Sulfa Antibiotics     Pt reports causes aches all over   Esomeprazole Magnesium Other (See Comments)    cramps   Septra [Bactrim] Other (See Comments)    Back ache    Sulfamethoxazole-Trimethoprim     Other reaction(s): Other (See Comments) Back ache Back ache Back ache     Medications Reviewed Today     Reviewed by Edythe Clarity, [facility] (Pharmacist) on 10/28/21 at 1557  Med List Status: <None>   Medication Order Taking? Sig Documenting Provider Last Dose Status Informant  acyclovir ointment (ZOVIRAX) 5 % [id number] Yes Apply 1 application topically daily as needed (cold sores & herpes). Susy Frizzle, MD Taking Active Self  atorvastatin (LIPITOR) 40 MG tablet [id number] Yes TAKE 1 TABLET(40 MG) BY MOUTH DAILY Nahser, [name], MD Taking Active   carvedilol (COREG) 12.5 MG tablet [id number] Yes TAKE 1 TABLET(12.5 MG) BY MOUTH TWICE DAILY WITH A MEAL Nahser, [name], MD Taking Active   Cholecalciferol (VITAMIN D) 50 MCG (2000 UT) CAPS [id number] Yes Take 1 capsule by mouth daily in the afternoon. [provider] Taking Active   clonazePAM (KLONOPIN) 0.5 MG tablet [id number] Yes TAKE 1 TABLET(0.5 MG) BY MOUTH AT BEDTIME AS NEEDED FOR ANXIETY OR SLEEP Susy Frizzle, MD Taking Active   clopidogrel (PLAVIX) 75 MG tablet [id number] Yes TAKE 1 TABLET(75 MG) BY MOUTH DAILY Susy Frizzle, MD Taking Active   Cyanocobalamin (VITAMIN B-12) 1000 MCG SUBL [id number] Yes Place 1 tablet under the tongue daily.  [provider] Taking Active Self  diltiazem (CARTIA XT) 180 MG 24 hr capsule [id number] Yes TAKE 1 CAPSULE(180 MG) BY MOUTH DAILY Nahser, [name], MD Taking Active   folic acid (FOLVITE) 1 MG tablet [id number] Yes TAKE 2 TABLETS(2 MG) BY MOUTH DAILY Susy Frizzle, MD Taking Active   folic acid (FOLVITE) 1 MG tablet [id number] Yes TAKE 2 TABLETS(2 MG) BY MOUTH DAILY Susy Frizzle, MD Taking Active   ibuprofen (ADVIL,MOTRIN) 200 MG tablet [id number] Yes Take 200 mg by mouth every 4 (four) hours as needed for headache, mild pain or moderate pain.  Liliane Shi,  PA-C Taking Active Self  nystatin-triamcinolone (MYCOLOG II) cream  [id number] Yes Apply 1 application topically as needed. [provider] Taking Active Self  pantoprazole (PROTONIX) 40 MG tablet [id number] Yes Take 1 tablet (40 mg total) by mouth daily. Susy Frizzle, MD Taking Active   predniSONE (DELTASONE) 20 MG tablet [id number] Yes 3 tabs poqday 1-2, 2 tabs poqday 3-4, 1 tab poqday 5-6 Susy Frizzle, MD Taking Active   ramipril (ALTACE) 10 MG capsule [id number] Yes TAKE 1 CAPSULE(10 MG) BY MOUTH DAILY Susy Frizzle, MD Taking Active   sildenafil (REVATIO) 20 MG tablet [id number] Yes 3-5 tablets daily as needed Nahser, [name], MD Taking Active Self  tamsulosin (FLOMAX) 0.4 MG CAPS capsule [id number] Yes Take 0.4 mg by mouth at bedtime. [provider] Taking Active Self  venlafaxine XR (EFFEXOR-XR) 150 MG 24 hr capsule [id number] Yes TAKE 1 CAPSULE(150 MG) BY MOUTH DAILY WITH BREAKFAST Susy Frizzle, MD Taking Active             Patient Active Problem List   Diagnosis Date Noted   Macrocytosis without anemia 43/32/9518   Metabolic acidosis 84/16/6063   TIA (transient ischemic attack) 09/25/2020   HTN (hypertension) 10/21/2013   Insomnia 07/04/2013   Chest heaviness 09/23/2012   Chest pain 08/02/2012   Anxiety and depression 08/02/2012   Left ventricular hypertrophy 08/25/2011    Immunization History  Administered Date(s) Administered   Fluad Quad(high Dose 65+) 11/05/2019, 09/16/2020   Influenza, High Dose Seasonal PF 09/05/2018   Influenza,inj,Quad PF,6+ Mos 09/20/2013, 11/19/2014, 09/17/2015, 09/13/2016, 11/03/2017   Pneumococcal Conjugate-13 03/18/2016   Pneumococcal Polysaccharide-23 03/17/2015   Tdap 02/16/2011    Conditions to be addressed/monitored:  HTN, HX of TIA, Depression/Anxiety, HLD, Insomnia  Care Plan : General Pharmacy (Adult)  Updates made by Edythe Clarity, RPH since 10/28/2021 12:00 AM     Problem: HTN, HX of TIA, Depression/Anxiety, HLD, Insomnia   Priority: High  Onset Date:  06/18/2021     Long-Range Goal: Patient-Specific Goal   Start Date: 06/18/2021  Expected End Date: 12/19/2021  Recent Progress: On track  Priority: High  Note:   Current Barriers:  Unable to independently monitor therapeutic efficacy  Pharmacist Clinical Goal(s):  Patient will achieve control of daytime sleepiness as evidenced by symptoms through collaboration with PharmD and provider.   Interventions: 1:1 collaboration with Susy Frizzle, MD regarding development and update of comprehensive plan of care as evidenced by provider attestation and co-signature Inter-disciplinary care team collaboration (see longitudinal plan of care) Comprehensive medication review performed; medication list updated in electronic medical record  Hypertension (BP goal <140/90) -Controlled -Current treatment: Ramipril 41m daily Diltiazem 180 CD daily Carvedilol 12.562mBID -Medications previously tried: none noted  -Current home readings: does not monitor all the time but reports it has been normal to on the high side  -Current exercise habits: not much lately, has recently wanted to be more active and started walking today actually.  Plans to increase physical activity -Denies hypotensive/hypertensive symptoms - however does report he is very tired all of the time -Educated on BP goals and benefits of medications for prevention of heart attack, stroke and kidney damage; Daily salt intake goal < 2300 mg; Exercise goal of 150 minutes per week; Importance of home blood pressure monitoring; Symptoms of hypotension and importance of maintaining adequate hydration; -Counseled to monitor BP at home weekly, document, and provide log at future appointments -Recommended to continue current medication Recommended he try switching Diltiazem  to night time dosing to see if splitting up his daytime BP meds helps with daytime sleepiness.  Update 10/28/21 Tried switching diltiazem to bedtime this did not seem to  help with sleepiness during the daytime.  His BP remains uncontrolled.  He reports systolic in the 161W and diastolic mostly in the 96E.  He has not been checking at home the past few weeks, however.  If BP remains elevated, he may benefit from addition of HCTZ 71m daily.  Will check in on BP in about 2-3 weeks to see how it is doing at home. Continue meds for now.  Hyperlipidemia: (LDL goal < 70) -Controlled -Current treatment: Atorvastatin 446mdaily -Medications previously tried: none noted  -Educated on Cholesterol goals;  Benefits of statin for ASCVD risk reduction; Importance of limiting foods high in cholesterol; Exercise goal of 150 minutes per week; Strategies to manage statin-induced myalgias; -Most recent LDL is excellent - patient is concerned his tiredness may be coming from statin as well as memory loss.  Stressed importance of these medications on reducing cardiovascular events, he is agreeable to continue. -Recommended to continue current medication Patient interested in decreasing dose and plans to follow up with cardiology on this.    Depression/Anxiety (Goal: Minimize symptoms) -Controlled -Current treatment: Venlafaxine ER 15081maily Clonazepam 0.5mg23mn -Medications previously tried/failed: Lexapro -PHQ9:  [place] 11     -GAD7: No flowsheet data found. -Educated on Benefits of medication for symptom control -Patient admits there still could be symptoms of depression, ie sleeping all day and late in the morning.  Wants to try exercise to see if this helps. -Recommended to continue current medication Encouraged him to let me know or come see Dr. PickDennard Schaumannsymptoms worsen or do not improve with exercise.  Update 10/28/21 From discussion it appears patient may have some untreated depression leading to lack of motivation.  He reports an overall lack of interest or motivation in  getting up to do things.  Wants to get out and exercise just has not been able to do so.  Denies any suicidal ideation or thoughts of self harm.  Plans to makes appointment with PCP first of the year on this. Continue meds for now - may benefit from switch to Zoloft or Wellbutrin to see how this help. Contact providers with any new or worsening symptoms.  Insomnia (Goal: Reduce symptoms) -Controlled -Current treatment  None noted -Medications previously tried: none -No reports of sleeping disturbances, in fact he sleeps too much per his wife and his own reports -Could be some untreated depression - patient aware to follow up if it does not improve  -Recommended continue current management strategies  Patient Goals/Self-Care Activities Patient will:  - take medications as prescribed focus on medication adherence by pill box check blood pressure a few times per week, document, and provide at future appointments  Follow Up Plan: The care management team will reach out to the patient again over the next 120 days.            Medication Assistance: None required.  Patient affirms current coverage meets needs.  Compliance/Adherence/Medication fill history: Care Gaps: None at this time  Star-Rating Drugs: Ramipril 10 mg 90 DS 03/16/21 Atorvastatin 40 mg 90 DS 03/13/21  Patient's preferred pharmacy is:  [facility]: [phone]: [phone]  [place], [address] [place] [address] Phone: [phone] Fax: [place], [address] AT [address] [place] [place] [address] Phone: [phone] Fax: [phone]  Uses pill box? Yes Pt endorses 100% compliance  We discussed: Benefits of  medication synchronization, packaging and delivery as well as enhanced pharmacist oversight with [employer]. Patient decided to: Continue current medication management strategy  Care Plan and Follow Up Patient Decision:  Patient agrees to Care Plan and Follow-up.  Plan: The care management team will reach out to the patient again over the next 120 days.  Beverly Milch, PharmD Clinical Pharmacist [facility] [phone]

## 2021-10-28 NOTE — Patient Instructions (Addendum)
Visit Information   Goals Addressed             This Visit's Progress    Track and Manage My Symptoms-Depression   On track    Timeframe:  Long-Range Goal Priority:  High Start Date:  06/18/21                           Expected End Date: 12/28/21                       Follow Up Date 09/27/21    - avoid negative self-talk - develop a personal safety plan - exercise at least 2 to 3 times per week - have a plan for how to handle bad days - spend time or talk with others at least 2 to 3 times per week    Why is this important?   Keeping track of your progress will help your treatment team find the right mix of medicine and therapy for you.  Write in your journal every day.  Day-to-day changes in depression symptoms are normal. It may be more helpful to check your progress at the end of each week instead of every day.     Notes:        Patient Care Plan: General Pharmacy (Adult)     Problem Identified: HTN, HX of TIA, Depression/Anxiety, HLD, Insomnia   Priority: High  Onset Date: 06/18/2021     Long-Range Goal: Patient-Specific Goal   Start Date: 06/18/2021  Expected End Date: 12/19/2021  Recent Progress: On track  Priority: High  Note:   Current Barriers:  Unable to independently monitor therapeutic efficacy  Pharmacist Clinical Goal(s):  Patient will achieve control of daytime sleepiness as evidenced by symptoms through collaboration with PharmD and provider.   Interventions: 1:1 collaboration with Susy Frizzle, MD regarding development and update of comprehensive plan of care as evidenced by provider attestation and co-signature Inter-disciplinary care team collaboration (see longitudinal plan of care) Comprehensive medication review performed; medication list updated in electronic medical record  Hypertension (BP goal <140/90) -Controlled -Current treatment: Ramipril 10mg  daily Diltiazem 180 CD daily Carvedilol 12.5mg  BID -Medications previously  tried: none noted  -Current home readings: does not monitor all the time but reports it has been normal to on the high side  -Current exercise habits: not much lately, has recently wanted to be more active and started walking today actually.  Plans to increase physical activity -Denies hypotensive/hypertensive symptoms - however does report he is very tired all of the time -Educated on BP goals and benefits of medications for prevention of heart attack, stroke and kidney damage; Daily salt intake goal < 2300 mg; Exercise goal of 150 minutes per week; Importance of home blood pressure monitoring; Symptoms of hypotension and importance of maintaining adequate hydration; -Counseled to monitor BP at home weekly, document, and provide log at future appointments -Recommended to continue current medication Recommended he try switching Diltiazem to night time dosing to see if splitting up his daytime BP meds helps with daytime sleepiness.  Update 10/28/21 Tried switching diltiazem to bedtime this did not seem to help with sleepiness during the daytime.  His BP remains uncontrolled.  He reports systolic in the 433I and diastolic mostly in the 95J.  He has not been checking at home the past few weeks, however.  If BP remains elevated, he may benefit from addition of HCTZ 25mg  daily.  Will check in  on BP in about 2-3 weeks to see how it is doing at home. Continue meds for now.  Hyperlipidemia: (LDL goal < 70) -Controlled -Current treatment: Atorvastatin 40mg  daily -Medications previously tried: none noted  -Educated on Cholesterol goals;  Benefits of statin for ASCVD risk reduction; Importance of limiting foods high in cholesterol; Exercise goal of 150 minutes per week; Strategies to manage statin-induced myalgias; -Most recent LDL is excellent - patient is concerned his tiredness may be coming from statin as well as memory loss.  Stressed importance of these medications on reducing cardiovascular  events, he is agreeable to continue. -Recommended to continue current medication Patient interested in decreasing dose and plans to follow up with cardiology on this.    Depression/Anxiety (Goal: Minimize symptoms) -Controlled -Current treatment: Venlafaxine ER 150mg  daily Clonazepam 0.5mg  prn -Medications previously tried/failed: Lexapro -PHQ9:  [place] Office Visit from 11/03/2017 in [place]  PHQ-9 Total Score 11     -GAD7: No flowsheet data found. -Educated on Benefits of medication for symptom control -Patient admits there still could be symptoms of depression, ie sleeping all day and late in the morning.  Wants to try exercise to see if this helps. -Recommended to continue current medication Encouraged him to let me know or come see Dr. Dennard Schaumann if symptoms worsen or do not improve with exercise.  Update 10/28/21 From discussion it appears patient may have some untreated depression leading to lack of motivation.  He reports an overall lack of interest or motivation in getting up to do things.  Wants to get out and exercise just has not been able to do so.  Denies any suicidal ideation or thoughts of self harm.  Plans to makes appointment with PCP first of the year on this. Continue meds for now - may benefit from switch to Zoloft or Wellbutrin to see how this help. Contact providers with any new or worsening symptoms.  Insomnia (Goal: Reduce symptoms) -Controlled -Current treatment  None noted -Medications previously tried: none -No reports of sleeping disturbances, in fact he sleeps too much per his wife and his own reports -Could be some untreated depression - patient aware to follow up if it does not improve  -Recommended continue current management strategies  Patient Goals/Self-Care Activities Patient will:  - take medications as prescribed focus on medication adherence by pill box check blood pressure a few times per week, document, and  provide at future appointments  Follow Up Plan: The care management team will reach out to the patient again over the next 120 days.           Patient verbalizes understanding of instructions provided today and agrees to view in [place].  Telephone follow up appointment with pharmacy team member scheduled for: 6 months  Edythe Clarity, [place]

## 2021-11-08 DIAGNOSIS — K22711 Barrett's esophagus with high grade dysplasia: Secondary | ICD-10-CM | POA: Diagnosis not present

## 2021-11-08 DIAGNOSIS — K2930 Chronic superficial gastritis without bleeding: Secondary | ICD-10-CM | POA: Diagnosis not present

## 2021-11-08 DIAGNOSIS — Z7902 Long term (current) use of antithrombotics/antiplatelets: Secondary | ICD-10-CM | POA: Diagnosis not present

## 2021-11-08 DIAGNOSIS — Z09 Encounter for follow-up examination after completed treatment for conditions other than malignant neoplasm: Secondary | ICD-10-CM | POA: Diagnosis not present

## 2021-11-08 DIAGNOSIS — Z8673 Personal history of transient ischemic attack (TIA), and cerebral infarction without residual deficits: Secondary | ICD-10-CM | POA: Diagnosis not present

## 2021-11-08 DIAGNOSIS — I10 Essential (primary) hypertension: Secondary | ICD-10-CM | POA: Diagnosis not present

## 2021-11-08 DIAGNOSIS — K449 Diaphragmatic hernia without obstruction or gangrene: Secondary | ICD-10-CM | POA: Diagnosis not present

## 2021-11-08 DIAGNOSIS — E785 Hyperlipidemia, unspecified: Secondary | ICD-10-CM | POA: Diagnosis not present

## 2021-11-08 DIAGNOSIS — Z8719 Personal history of other diseases of the digestive system: Secondary | ICD-10-CM | POA: Diagnosis not present

## 2021-11-18 ENCOUNTER — Telehealth: Payer: Self-pay

## 2021-11-18 NOTE — Telephone Encounter (Signed)
Janett Billow review clearance form and declined signature due to cardiac risk assessment needed on the form.

## 2021-11-18 NOTE — Telephone Encounter (Signed)
Preoperative Clearance Form received from [facility] for EGD ( GI procedure)  Placed on NP desk for review and signature if appropriate.

## 2021-11-25 ENCOUNTER — Telehealth: Payer: Self-pay | Admitting: Pharmacist

## 2021-11-25 NOTE — Chronic Care Management (AMB) (Signed)
Chronic Care Management Pharmacy Assistant   Name: Walter Cross  MRN: [id number] DOB: Apr 12, 1951   Reason for Encounter: Hypertension Adherence Call    Recent office visits:  None  Recent consult visits:  None  Hospital visits:  None in previous 6 months  Medications: Outpatient Encounter Medications as of 11/25/2021  Medication Sig   acyclovir ointment (ZOVIRAX) 5 % Apply 1 application topically daily as needed (cold sores & herpes).   atorvastatin (LIPITOR) 40 MG tablet TAKE 1 TABLET(40 MG) BY MOUTH DAILY   carvedilol (COREG) 12.5 MG tablet TAKE 1 TABLET(12.5 MG) BY MOUTH TWICE DAILY WITH A MEAL   Cholecalciferol (VITAMIN D) 50 MCG (2000 UT) CAPS Take 1 capsule by mouth daily in the afternoon.   clonazePAM (KLONOPIN) 0.5 MG tablet TAKE 1 TABLET(0.5 MG) BY MOUTH AT BEDTIME AS NEEDED FOR ANXIETY OR SLEEP   clopidogrel (PLAVIX) 75 MG tablet TAKE 1 TABLET(75 MG) BY MOUTH DAILY   Cyanocobalamin (VITAMIN B-12) 1000 MCG SUBL Place 1 tablet under the tongue daily.    diltiazem (CARTIA XT) 180 MG 24 hr capsule TAKE 1 CAPSULE(180 MG) BY MOUTH DAILY   folic acid (FOLVITE) 1 MG tablet TAKE 2 TABLETS(2 MG) BY MOUTH DAILY   folic acid (FOLVITE) 1 MG tablet TAKE 2 TABLETS(2 MG) BY MOUTH DAILY   ibuprofen (ADVIL,MOTRIN) 200 MG tablet Take 200 mg by mouth every 4 (four) hours as needed for headache, mild pain or moderate pain.    nystatin-triamcinolone (MYCOLOG II) cream Apply 1 application topically as needed.   pantoprazole (PROTONIX) 40 MG tablet Take 1 tablet (40 mg total) by mouth daily.   predniSONE (DELTASONE) 20 MG tablet 3 tabs poqday 1-2, 2 tabs poqday 3-4, 1 tab poqday 5-6   ramipril (ALTACE) 10 MG capsule TAKE 1 CAPSULE(10 MG) BY MOUTH DAILY   sildenafil (REVATIO) 20 MG tablet 3-5 tablets daily as needed   tamsulosin (FLOMAX) 0.4 MG CAPS capsule Take 0.4 mg by mouth at bedtime.   venlafaxine XR (EFFEXOR-XR) 150 MG 24 hr capsule TAKE 1 CAPSULE(150 MG) BY MOUTH DAILY WITH  BREAKFAST   No facility-administered encounter medications on file as of 11/25/2021.   Reviewed chart prior to disease state call. Spoke with patient regarding BP  Recent Office Vitals: BP Readings from Last 3 Encounters:  09/01/21 (!) 152/84  06/14/21 (!) 142/78  05/14/21 118/60   Pulse Readings from Last 3 Encounters:  09/01/21 84  06/14/21 77  05/14/21 94    Wt Readings from Last 3 Encounters:  09/01/21 175 lb (79.4 kg)  06/14/21 174 lb 3.2 oz (79 kg)  05/14/21 172 lb 3.2 oz (78.1 kg)     Kidney Function Lab Results  Component Value Date/Time   CREATININE 0.78 05/12/2021 05:45 PM   CREATININE 0.89 12/04/2020 03:19 PM   CREATININE 0.77 04/01/2019 11:10 AM   CREATININE 0.82 03/26/2018 09:49 AM   GFR 96.72 04/16/2013 09:21 AM   GFRNONAA >60 05/12/2021 05:45 PM   GFRNONAA 93 04/01/2019 11:10 AM   GFRAA 100 12/04/2020 03:19 PM   GFRAA 107 04/01/2019 11:10 AM    BMP Latest Ref Rng & Units 05/12/2021 12/04/2020 09/26/2020  Glucose 70 - 99 mg/dL 124(H) 126(H) 125(H)  BUN 8 - 23 mg/dL 23 9 8   Creatinine 0.61 - 1.24 mg/dL 0.78 0.89 0.76  BUN/Creat Ratio 10 - 24 - 10 -  Sodium 135 - 145 mmol/L 138 136 137  Potassium 3.5 - 5.1 mmol/L 3.7 4.8 3.9  Chloride 98 -  111 mmol/L 102 96 103  CO2 22 - 32 mmol/L 24 25 23   Calcium 8.9 - 10.3 mg/dL 9.3 9.3 9.0    Current antihypertensive regimen:  Ramipril 10 mg daily Diltiazem 180 mg Carvedilol 12.5 mg  How often are you checking your Blood Pressure? infrequently  Current home BP readings: 138/72 on 12/04/2020  What recent interventions/DTPs have been made by any provider to improve Blood Pressure control since last CPP Visit: No recent interventions or DTPs.  Any recent hospitalizations or ED visits since last visit with CPP? No   Adherence Review: Is the patient currently on Walter/ARB medication? Yes Does the patient have >5 day gap between last estimated fill dates? No   Care Gaps: Medicare Annual Wellness: Completed  04/09/2021 Hemoglobin A1C: 5.8% on 09/26/2020 Colonoscopy: Completed 07/23/2014   Future Appointments  Date Time Provider [place]  03/03/2022  2:15 PM [facility] PHARMACIST [facility] None   Star Rating Drugs: Atorvastatin last filled 06/11/2021 90 DS Ramipril last filled 06/11/2021 90 DS  April D Calhoun, [place] Pharmacist Assistant [phone]

## 2021-11-27 DIAGNOSIS — F419 Anxiety disorder, unspecified: Secondary | ICD-10-CM | POA: Diagnosis not present

## 2021-11-27 DIAGNOSIS — F32A Depression, unspecified: Secondary | ICD-10-CM

## 2021-11-27 DIAGNOSIS — I10 Essential (primary) hypertension: Secondary | ICD-10-CM

## 2021-12-04 DIAGNOSIS — G4700 Insomnia, unspecified: Secondary | ICD-10-CM | POA: Diagnosis not present

## 2021-12-04 DIAGNOSIS — E785 Hyperlipidemia, unspecified: Secondary | ICD-10-CM | POA: Diagnosis not present

## 2021-12-04 DIAGNOSIS — F3342 Major depressive disorder, recurrent, in full remission: Secondary | ICD-10-CM | POA: Diagnosis not present

## 2021-12-04 DIAGNOSIS — Z8673 Personal history of transient ischemic attack (TIA), and cerebral infarction without residual deficits: Secondary | ICD-10-CM | POA: Diagnosis not present

## 2021-12-04 DIAGNOSIS — Z7902 Long term (current) use of antithrombotics/antiplatelets: Secondary | ICD-10-CM | POA: Diagnosis not present

## 2021-12-04 DIAGNOSIS — E663 Overweight: Secondary | ICD-10-CM | POA: Diagnosis not present

## 2021-12-04 DIAGNOSIS — N400 Enlarged prostate without lower urinary tract symptoms: Secondary | ICD-10-CM | POA: Diagnosis not present

## 2021-12-04 DIAGNOSIS — Z7901 Long term (current) use of anticoagulants: Secondary | ICD-10-CM | POA: Diagnosis not present

## 2021-12-04 DIAGNOSIS — K219 Gastro-esophageal reflux disease without esophagitis: Secondary | ICD-10-CM | POA: Diagnosis not present

## 2021-12-04 DIAGNOSIS — I10 Essential (primary) hypertension: Secondary | ICD-10-CM | POA: Diagnosis not present

## 2021-12-08 ENCOUNTER — Other Ambulatory Visit: Payer: Self-pay | Admitting: Family Medicine

## 2021-12-08 ENCOUNTER — Other Ambulatory Visit: Payer: Self-pay

## 2021-12-08 DIAGNOSIS — I10 Essential (primary) hypertension: Secondary | ICD-10-CM

## 2021-12-08 DIAGNOSIS — I421 Obstructive hypertrophic cardiomyopathy: Secondary | ICD-10-CM

## 2021-12-08 MED ORDER — DILTIAZEM HCL ER COATED BEADS 180 MG PO CP24: ORAL_CAPSULE | ORAL | 1 refills | Status: DC

## 2021-12-08 MED ORDER — CARVEDILOL 12.5 MG PO TABS: ORAL_TABLET | ORAL | 1 refills | Status: DC

## 2022-01-11 ENCOUNTER — Encounter: Payer: Self-pay | Admitting: Family Medicine

## 2022-01-11 MED ORDER — CLOPIDOGREL BISULFATE 75 MG PO TABS: 75.0000 mg | ORAL_TABLET | Freq: Every day | ORAL | 3 refills | Status: DC

## 2022-02-01 ENCOUNTER — Other Ambulatory Visit: Payer: Self-pay | Admitting: Family Medicine

## 2022-03-03 ENCOUNTER — Telehealth: Payer: Medicare Other

## 2022-03-12 ENCOUNTER — Other Ambulatory Visit: Payer: Self-pay | Admitting: Family Medicine

## 2022-03-26 ENCOUNTER — Other Ambulatory Visit: Payer: Self-pay | Admitting: Family Medicine

## 2022-03-28 NOTE — Telephone Encounter (Signed)
Requested medications are due for refill today.  yes ? ?Requested medications are on the active medications list.  yes ? ?Last refill. 03/15/2021 #90 3 refills ? ?Future visit scheduled.   no ? ?Notes to clinic.  Medication refill failed protocol due to expired labs. ? ? ? ?Requested Prescriptions  ?Pending Prescriptions Disp Refills  ? ramipril (ALTACE) 10 MG capsule [Pharmacy Med Name: RAMIPRIL '10MG'$  CAPSULES] 90 capsule 3  ?  Sig: TAKE 1 CAPSULE(10 MG) BY MOUTH DAILY  ?  ? Cardiovascular:  ACE Inhibitors Failed - 03/26/2022  3:37 AM  ?  ?  Failed - Cr in normal range and within 180 days  ?  Creat  ?Date Value Ref Range Status  ?04/01/2019 0.77 0.70 - 1.25 mg/dL Final  ?  Comment:  ?  For patients >102 years of age, the reference limit ?for Creatinine is approximately 13% higher for people ?identified as African-American. ?. ?  ? ?Creatinine, Ser  ?Date Value Ref Range Status  ?05/12/2021 0.78 0.61 - 1.24 mg/dL Final  ?  ?  ?  ?  Failed - K in normal range and within 180 days  ?  Potassium  ?Date Value Ref Range Status  ?05/12/2021 3.7 3.5 - 5.1 mmol/L Final  ?  ?  ?  ?  Failed - Last BP in normal range  ?  BP Readings from Last 1 Encounters:  ?09/01/21 (!) 152/84  ?  ?  ?  ?  Failed - Valid encounter within last 6 months  ?  Recent Outpatient Visits   ? ?      ? 10 months ago Long term (current) use of anticoagulants  ? [place], NP  ? 1 year ago History of TIA (transient ischemic attack)  ? [place] Pickard, Cammie Mcgee, MD  ? 2 years ago General medical exam  ? [facility] Susy Frizzle, MD  ? 4 years ago General medical exam  ? [facility] Susy Frizzle, MD  ? 4 years ago Rectal pressure  ? [facility] Pickard, Cammie Mcgee, MD  ? ?  ?  ? ? ?  ?  ?  Passed - Patient is not pregnant  ?  ?  ?  ?

## 2022-03-30 ENCOUNTER — Other Ambulatory Visit: Payer: Self-pay | Admitting: Family Medicine

## 2022-03-30 DIAGNOSIS — F321 Major depressive disorder, single episode, moderate: Secondary | ICD-10-CM

## 2022-03-31 ENCOUNTER — Encounter: Payer: Self-pay | Admitting: Family Medicine

## 2022-03-31 NOTE — Telephone Encounter (Signed)
LOV 05/14/21 ?Last refill 07/08/21, #30, 1 refills ? ?Please review, thanks! ? ?

## 2022-03-31 NOTE — Telephone Encounter (Signed)
Requested medication (s) are due for refill today: Yes ? ?Requested medication (s) are on the active medication list: Yes ? ?Last refill:  07/08/21 ? ?Future visit scheduled: No. Protocol indicates pt. Needs appointment. ? ?Notes to clinic:  Not delegated. ? ? ? ?Requested Prescriptions  ?Pending Prescriptions Disp Refills  ? clonazePAM (KLONOPIN) 0.5 MG tablet [Pharmacy Med Name: CLONAZEPAM 0.'5MG'$  TABLETS] 30 tablet   ?  Sig: TAKE 1 TABLET(0.5 MG) BY MOUTH AT BEDTIME AS NEEDED FOR ANXIETY OR SLEEP  ?  ? Not Delegated - Psychiatry: Anxiolytics/Hypnotics 2 Failed - 03/30/2022  9:39 PM  ?  ?  Failed - This refill cannot be delegated  ?  ?  Failed - Urine Drug Screen completed in last 360 days  ?  ?  Failed - Valid encounter within last 6 months  ?  Recent Outpatient Visits   ? ?      ? 10 months ago Long term (current) use of anticoagulants  ? [place], NP  ? 1 year ago History of TIA (transient ischemic attack)  ? [place] Pickard, Cammie Mcgee, MD  ? 3 years ago General medical exam  ? [facility] Susy Frizzle, MD  ? 4 years ago General medical exam  ? [facility] Susy Frizzle, MD  ? 4 years ago Rectal pressure  ? [facility] Pickard, Cammie Mcgee, MD  ? ?  ?  ? ? ?  ?  ?  Passed - Patient is not pregnant  ?  ?  ? ?

## 2022-04-04 ENCOUNTER — Ambulatory Visit (INDEPENDENT_AMBULATORY_CARE_PROVIDER_SITE_OTHER): Payer: PPO | Admitting: Family Medicine

## 2022-04-04 VITALS — BP 148/91 | HR 85 | Temp 98.7°F | Ht 69.0 in | Wt 174.6 lb

## 2022-04-04 DIAGNOSIS — I10 Essential (primary) hypertension: Secondary | ICD-10-CM

## 2022-04-04 DIAGNOSIS — Z125 Encounter for screening for malignant neoplasm of prostate: Secondary | ICD-10-CM

## 2022-04-04 DIAGNOSIS — Z8673 Personal history of transient ischemic attack (TIA), and cerebral infarction without residual deficits: Secondary | ICD-10-CM

## 2022-04-04 DIAGNOSIS — E7800 Pure hypercholesterolemia, unspecified: Secondary | ICD-10-CM

## 2022-04-04 MED ORDER — DOXAZOSIN MESYLATE 4 MG PO TABS: 4.0000 mg | ORAL_TABLET | Freq: Every day | ORAL | 3 refills | Status: DC

## 2022-04-04 NOTE — Progress Notes (Signed)
? ?Subjective:  ? ? Patient ID: Walter Cross, male    DOB: June 22, 1951, 71 y.o.   MRN: [id number] ? ?HPI ? ?Patient's blood pressure is elevated today.  He states that he is seeing similar numbers at home in the 140s to 481 range systolic.  He denies any chest pain shortness of breath or dyspnea on exertion.  He denies any recent TIA-like symptoms.  He did have a TIA in February 2022 since that time he has been consistent taking his Plavix and also taking his Lipitor.  Since then he has had no other neurologic deficits.  He is overdue for prostate cancer screening.  He denies any right upper quadrant pain or myalgias on his Lipitor.  He denies any melena or hematochezia on Plavix.  He is scheduled to undergo radiofrequency ablation of Barrett's esophagus.  This is being performed by his gastroenterologist. ? ?Past Surgical History:  ?Procedure Laterality Date  ? CERVICAL SPINE SURGERY  [phone]  ? nerve release, Dr Hal Neer  ? SPINE SURGERY    ? L4-L5, [id number]  ? ?Current Outpatient Medications on File Prior to Visit  ?Medication Sig Dispense Refill  ? acyclovir ointment (ZOVIRAX) 5 % Apply 1 application topically daily as needed (cold sores & herpes). 15 g 1  ? atorvastatin (LIPITOR) 40 MG tablet TAKE 1 TABLET(40 MG) BY MOUTH DAILY 90 tablet 2  ? carvedilol (COREG) 12.5 MG tablet TAKE 1 TABLET(12.5 MG) BY MOUTH TWICE DAILY WITH A MEAL 180 tablet 1  ? Cholecalciferol (VITAMIN D) 50 MCG (2000 UT) CAPS Take 1 capsule by mouth daily in the afternoon.    ? clonazePAM (KLONOPIN) 0.5 MG tablet TAKE 1 TABLET(0.5 MG) BY MOUTH AT BEDTIME AS NEEDED FOR ANXIETY OR SLEEP 30 tablet 0  ? clopidogrel (PLAVIX) 75 MG tablet Take 1 tablet (75 mg total) by mouth daily. 90 tablet 3  ? Cyanocobalamin (VITAMIN B-12) 1000 MCG SUBL Place 1 tablet under the tongue daily.     ? diltiazem (CARTIA XT) 180 MG 24 hr capsule TAKE 1 CAPSULE(180 MG) BY MOUTH DAILY 90 capsule 1  ? folic acid (FOLVITE) 1 MG tablet TAKE 2 TABLETS(2 MG) BY MOUTH  DAILY 180 tablet 0  ? ibuprofen (ADVIL,MOTRIN) 200 MG tablet Take 200 mg by mouth every 4 (four) hours as needed for headache, mild pain or moderate pain.     ? nystatin-triamcinolone (MYCOLOG II) cream Apply 1 application topically as needed.    ? pantoprazole (PROTONIX) 40 MG tablet Take 1 tablet (40 mg total) by mouth daily. 90 tablet 1  ? predniSONE (DELTASONE) 20 MG tablet 3 tabs poqday 1-2, 2 tabs poqday 3-4, 1 tab poqday 5-6 12 tablet 0  ? ramipril (ALTACE) 10 MG capsule Take 1 capsule (10 mg total) by mouth daily. PT NEEDS OV AND LABS 90 capsule 0  ? sildenafil (REVATIO) 20 MG tablet 3-5 tablets daily as needed 50 tablet 6  ? tamsulosin (FLOMAX) 0.4 MG CAPS capsule Take 0.4 mg by mouth at bedtime.    ? venlafaxine XR (EFFEXOR-XR) 150 MG 24 hr capsule TAKE 1 CAPSULE(150 MG) BY MOUTH DAILY WITH BREAKFAST 90 capsule 3  ? ?No current facility-administered medications on file prior to visit.  ? ?Allergies  ?Allergen Reactions  ? Sulfa Antibiotics   ?  Pt reports causes aches all over  ? Esomeprazole Magnesium Other (See Comments)  ?  cramps  ? Septra [Bactrim] Other (See Comments)  ?  Back ache  ? Sulfamethoxazole-Trimethoprim   ?  Other reaction(s): Other (See Comments) ?Back ache ?Back ache ?Back ache ?  ? ?Social History  ? ?Socioeconomic History  ? Marital status: Widowed  ?  Spouse name: Not on file  ? Number of children: Not on file  ? Years of education: Not on file  ? Highest education level: Not on file  ?Occupational History  ? Occupation: Radio producer  ?  Employer: [employer]  ?Tobacco Use  ? Smoking status: Former  ?  Packs/day: 1.00  ?  Years: 7.00  ?  Pack years: 7.00  ?  Types: Cigarettes  ?  Quit date: 11/29/1975  ?  Years since quitting: 46.3  ? Smokeless tobacco: Never  ?Vaping Use  ? Vaping Use: Never used  ?Substance and Sexual Activity  ? Alcohol use: Yes  ?  Alcohol/week: 2.0 standard drinks  ?  Types: 2 Standard drinks or equivalent per week  ? Drug use: No  ? Sexual  activity: Not on file  ?Other Topics Concern  ? Not on file  ?Social History Narrative  ? Not on file  ? ?Social Determinants of Health  ? ?Financial Resource Strain: Low Risk   ? Difficulty of Paying Living Expenses: Not hard at all  ?Food Insecurity: No Food Insecurity  ? Worried About Charity fundraiser in the Last Year: Never true  ? Ran Out of Food in the Last Year: Never true  ?Transportation Needs: No Transportation Needs  ? Lack of Transportation (Medical): No  ? Lack of Transportation (Non-Medical): No  ?Physical Activity: Inactive  ? Days of Exercise per Week: 0 days  ? Minutes of Exercise per Session: 0 min  ?Stress: No Stress Concern Present  ? Feeling of Stress : Not at all  ?Social Connections: Socially Isolated  ? Frequency of Communication with Friends and Family: Three times a week  ? Frequency of Social Gatherings with Friends and Family: Once a week  ? Attends Religious Services: Never  ? Active Member of Clubs or Organizations: No  ? Attends Archivist Meetings: Never  ? Marital Status: Widowed  ?Intimate Partner Violence: Not At Risk  ? Fear of Current or Ex-Partner: No  ? Emotionally Abused: No  ? Physically Abused: No  ? Sexually Abused: No  ? ?Family History  ?Problem Relation Age of Onset  ? Cancer Mother 33  ?     cervical  ? Cancer Father   ?     lung  ? Stroke Brother   ? Atrial fibrillation Other   ? ? ?.p ? ? ?Review of Systems  ?All other systems reviewed and are negative. ? ?   ?Objective:  ? Physical Exam ?Vitals reviewed.  ?Constitutional:   ?   General: He is not in acute distress. ?   Appearance: He is well-developed. He is not diaphoretic.  ?HENT:  ?   Head: Normocephalic and atraumatic.  ?   Right Ear: External ear normal.  ?   Left Ear: External ear normal.  ?   Nose: Nose normal.  ?   Mouth/Throat:  ?   Pharynx: No oropharyngeal exudate.  ?Eyes:  ?   General: No scleral icterus.    ?   Right eye: No discharge.     ?   Left eye: No discharge.  ?   Conjunctiva/sclera:  Conjunctivae normal.  ?   Pupils: Pupils are equal, round, and reactive to light.  ?Neck:  ?   Thyroid: No thyromegaly.  ?  Vascular: No JVD.  ?   Trachea: No tracheal deviation.  ?Cardiovascular:  ?   Rate and Rhythm: Normal rate and regular rhythm.  ?   Heart sounds: Normal heart sounds. No murmur heard. ?  No friction rub. No gallop.  ?Pulmonary:  ?   Effort: Pulmonary effort is normal. No respiratory distress.  ?   Breath sounds: Normal breath sounds. No stridor. No wheezing or rales.  ?Chest:  ?   Chest wall: No tenderness.  ?Abdominal:  ?   General: Bowel sounds are normal. There is no distension.  ?   Palpations: Abdomen is soft. There is no mass.  ?   Tenderness: There is no abdominal tenderness. There is no guarding or rebound.  ?Genitourinary: ?   Prostate: Normal.  ?   Rectum: Normal.  ?Musculoskeletal:     ?   General: No tenderness. Normal range of motion.  ?   Cervical back: Normal range of motion and neck supple.  ?Lymphadenopathy:  ?   Cervical: No cervical adenopathy.  ?Skin: ?   General: Skin is warm.  ?   Coloration: Skin is not pale.  ?   Findings: No erythema or rash.  ?Neurological:  ?   Mental Status: He is alert and oriented to person, place, and time.  ?   Cranial Nerves: No cranial nerve deficit.  ?   Motor: No abnormal muscle tone.  ?   Coordination: Coordination normal.  ?   Deep Tendon Reflexes: Reflexes are normal and symmetric.  ?Psychiatric:     ?   Behavior: Behavior normal.     ?   Thought Content: Thought content normal.     ?   Judgment: Judgment normal.  ? ? ? ? ? ?   ?Assessment & Plan:  ?History of TIA (transient ischemic attack) - Plan: CBC with Differential/Platelet, Lipid panel, COMPLETE METABOLIC PANEL WITH GFR ? ?Primary hypertension - Plan: CBC with Differential/Platelet, Lipid panel, COMPLETE METABOLIC PANEL WITH GFR ? ?Pure hypercholesterolemia - Plan: CBC with Differential/Platelet, Lipid panel, COMPLETE METABOLIC PANEL WITH GFR ? ?Prostate cancer screening - Plan:  PSA ?I will screen the patient for prostate cancer with PSA.  I will check a CBC CMP and a fasting lipid panel.  Goal LDL cholesterol is less than 70.  Blood pressure is elevated so I recommended the

## 2022-04-05 LAB — COMPLETE METABOLIC PANEL WITH GFR
AG Ratio: 1.5 (calc) (ref 1.0–2.5)
ALT: 30 U/L (ref 9–46)
AST: 24 U/L (ref 10–35)
Albumin: 3.8 g/dL (ref 3.6–5.1)
Alkaline phosphatase (APISO): 80 U/L (ref 35–144)
BUN: 8 mg/dL (ref 7–25)
CO2: 30 mmol/L (ref 20–32)
Calcium: 9.3 mg/dL (ref 8.6–10.3)
Chloride: 98 mmol/L (ref 98–110)
Creat: 0.82 mg/dL (ref 0.70–1.28)
Globulin: 2.5 g/dL (calc) (ref 1.9–3.7)
Glucose, Bld: 112 mg/dL — ABNORMAL HIGH (ref 65–99)
Potassium: 4.4 mmol/L (ref 3.5–5.3)
Sodium: 137 mmol/L (ref 135–146)
Total Bilirubin: 0.8 mg/dL (ref 0.2–1.2)
Total Protein: 6.3 g/dL (ref 6.1–8.1)
eGFR: 93 mL/min/{1.73_m2} (ref >=60)

## 2022-04-05 LAB — LIPID PANEL
Cholesterol: 155 mg/dL (ref <200)
HDL: 72 mg/dL (ref >=40)
LDL Cholesterol (Calc): 62 mg/dL (calc)
Non-HDL Cholesterol (Calc): 83 mg/dL (calc) (ref <130)
Total CHOL/HDL Ratio: 2.2 (calc) (ref <5.0)
Triglycerides: 125 mg/dL (ref <150)

## 2022-04-05 LAB — CBC WITH DIFFERENTIAL/PLATELET
Absolute Monocytes: 926 cells/uL (ref 200–950)
Basophils Absolute: 53 cells/uL (ref 0–200)
Basophils Relative: 0.9 %
Eosinophils Absolute: 71 cells/uL (ref 15–500)
Eosinophils Relative: 1.2 %
HCT: 42.5 % (ref 38.5–50.0)
Hemoglobin: 14.7 g/dL (ref 13.2–17.1)
Lymphs Abs: 873 cells/uL (ref 850–3900)
MCH: 33 pg (ref 27.0–33.0)
MCHC: 34.6 g/dL (ref 32.0–36.0)
MCV: 95.3 fL (ref 80.0–100.0)
MPV: 10.4 fL (ref 7.5–12.5)
Monocytes Relative: 15.7 %
Neutro Abs: 3977 cells/uL (ref 1500–7800)
Neutrophils Relative %: 67.4 %
Platelets: 205 10*3/uL (ref 140–400)
RBC: 4.46 10*6/uL (ref 4.20–5.80)
RDW: 12.8 % (ref 11.0–15.0)
Total Lymphocyte: 14.8 %
WBC: 5.9 10*3/uL (ref 3.8–10.8)

## 2022-04-05 LAB — PSA: PSA: 0.51 ng/mL (ref <=4.00)

## 2022-04-28 ENCOUNTER — Ambulatory Visit (INDEPENDENT_AMBULATORY_CARE_PROVIDER_SITE_OTHER): Payer: PPO

## 2022-04-28 VITALS — Ht 69.0 in | Wt 174.0 lb

## 2022-04-28 DIAGNOSIS — Z0000 Encounter for general adult medical examination without abnormal findings: Secondary | ICD-10-CM

## 2022-04-28 NOTE — Progress Notes (Signed)
Subjective:   Walter Cross is a 71 y.o. male who presents for Medicare Annual/Subsequent preventive examination. Virtual Visit via Telephone Note  I connected with  Walter Cross on 04/28/22 at  3:45 PM EDT by telephone and verified that I am speaking with the correct person using two identifiers.  Location: Patient: HOME Provider: [facility] Persons participating in the virtual visit: patient/Nurse Health Advisor   I discussed the limitations, risks, security and privacy concerns of performing an evaluation and management service by telephone and the availability of in person appointments. The patient expressed understanding and agreed to proceed.  Interactive audio and video telecommunications were attempted between this nurse and patient, however failed, due to patient having technical difficulties OR patient did not have access to video capability.  We continued and completed visit with audio only.  Some vital signs may be absent or patient reported.   Chriss Driver, LPN  Review of Systems     Cardiac Risk Factors include: advanced age (>39mn, >>48women);hypertension;male gender;sedentary lifestyle     Objective:    Today's Vitals   04/28/22 1545  Weight: 174 lb (78.9 kg)  Height: '5\' 9"'$  (1.753 m)   Body mass index is 25.7 kg/m.     04/28/2022    3:53 PM 04/09/2021    3:52 PM 09/26/2020    1:00 AM  Advanced Directives  Does Patient Have a Medical Advance Directive? No No No  Does patient want to make changes to medical advance directive?  Yes (MAU/Ambulatory/Procedural Areas - Information given)   Would patient like information on creating a medical advance directive? No - Patient declined  No - Patient declined    Current Medications (verified) Outpatient Encounter Medications as of 04/28/2022  Medication Sig   acyclovir ointment (ZOVIRAX) 5 % Apply 1 application topically daily as needed (cold sores & herpes).   atorvastatin (LIPITOR) 40 MG tablet TAKE  1 TABLET(40 MG) BY MOUTH DAILY   carvedilol (COREG) 12.5 MG tablet TAKE 1 TABLET(12.5 MG) BY MOUTH TWICE DAILY WITH A MEAL   Cholecalciferol (VITAMIN D) 50 MCG (2000 UT) CAPS Take 1 capsule by mouth daily in the afternoon.   clonazePAM (KLONOPIN) 0.5 MG tablet TAKE 1 TABLET(0.5 MG) BY MOUTH AT BEDTIME AS NEEDED FOR ANXIETY OR SLEEP   clopidogrel (PLAVIX) 75 MG tablet Take 1 tablet (75 mg total) by mouth daily.   Cyanocobalamin (VITAMIN B-12) 1000 MCG SUBL Place 1 tablet under the tongue daily.    diltiazem (CARTIA XT) 180 MG 24 hr capsule TAKE 1 CAPSULE(180 MG) BY MOUTH DAILY   folic acid (FOLVITE) 1 MG tablet TAKE 2 TABLETS(2 MG) BY MOUTH DAILY   ibuprofen (ADVIL,MOTRIN) 200 MG tablet Take 200 mg by mouth every 4 (four) hours as needed for headache, mild pain or moderate pain.    nystatin-triamcinolone (MYCOLOG II) cream Apply 1 application topically as needed.   pantoprazole (PROTONIX) 40 MG tablet Take 1 tablet (40 mg total) by mouth daily.   ramipril (ALTACE) 10 MG capsule Take 1 capsule (10 mg total) by mouth daily. PT NEEDS OV AND LABS   sildenafil (REVATIO) 20 MG tablet 3-5 tablets daily as needed   venlafaxine XR (EFFEXOR-XR) 150 MG 24 hr capsule TAKE 1 CAPSULE(150 MG) BY MOUTH DAILY WITH BREAKFAST   doxazosin (CARDURA) 4 MG tablet Take 1 tablet (4 mg total) by mouth daily. Stop tamsulosin (Patient not taking: Reported on 04/28/2022)   No facility-administered encounter medications on file as of 04/28/2022.  Allergies (verified) Sulfa antibiotics, Esomeprazole magnesium, Septra [bactrim], and Sulfamethoxazole-trimethoprim   History: Past Medical History:  Diagnosis Date   Adenomatous colon polyp 2015    Buccini (q 5 yrs)    Barrett's esophagus    Benign colon polyp    Depression    Dermatitis    axillary   Exposure to asbestos    auto brake liners   Genital HSV    GERD (gastroesophageal reflux disease)    Hx of cardiovascular stress test    ETT-Myoview 9/13: no ischemia, EF  81%   Hyperlipidemia    Hypertension    Hypertrophic cardiomyopathy ([place])    a. Echo 2007: mod LVH, diast dysfn, mild MR, no LVOT obstruction;  b. echo 9/13: upper septal thickening, no SAM of MV, no LVOT gradient, mild LVH, EF 65%   TIA (transient ischemic attack)    Vitamin D deficiency    Past Surgical History:  Procedure Laterality Date   CERVICAL SPINE SURGERY  1992-1993   nerve release, Dr Hal Neer   SPINE SURGERY     L4-L5, [phone]   Family History  Problem Relation Age of Onset   Cancer Mother        cervical   Cancer Father        lung   Stroke Brother    Atrial fibrillation Other    Social History   Socioeconomic History   Marital status: Widowed    Spouse name: Not on file   Number of children: Not on file   Years of education: Not on file   Highest education level: Not on file  Occupational History   Occupation: Radio producer    Employer: [employer]  Tobacco Use   Smoking status: Former    Packs/day: 1.00    Years: 7.00    Pack years: 7.00    Types: Cigarettes    Quit date: 11/29/1975    Years since quitting: 46.4   Smokeless tobacco: Never  Vaping Use   Vaping Use: Never used  Substance and Sexual Activity   Alcohol use: Yes    Alcohol/week: 2.0 standard drinks    Types: 2 Standard drinks or equivalent per week   Drug use: No   Sexual activity: Not Currently    Birth control/protection: None  Other Topics Concern   Not on file  Social History Narrative   Not on file   Social Determinants of Health   Financial Resource Strain: Low Risk    Difficulty of Paying Living Expenses: Not hard at all  Food Insecurity: No Food Insecurity   Worried About Charity fundraiser in the Last Year: Never true   [place] in the Last Year: Never true  Transportation Needs: Unknown   Lack of Transportation (Medical): No   Lack of Transportation (Non-Medical): Not on file  Physical Activity: Insufficiently Active   Days of  Exercise per Week: 3 days   Minutes of Exercise per Session: 30 min  Stress: No Stress Concern Present   Feeling of Stress : Not at all  Social Connections: Socially Isolated   Frequency of Communication with Friends and Family: More than three times a week   Frequency of Social Gatherings with Friends and Family: Twice a week   Attends Religious Services: Never   Marine scientist or Organizations: No   Attends Archivist Meetings: Never   Marital Status: Widowed    Tobacco Counseling Counseling given: Not Answered   Clinical  Intake:  Pre-visit preparation completed: Yes  Pain : No/denies pain     BMI - recorded: 25.7 Nutritional Status: BMI 25 -29 Overweight Nutritional Risks: None Diabetes: No  How often do you need to have someone help you when you read instructions, pamphlets, or other written materials from your doctor or pharmacy?: 1 - Never  Diabetic?NO  Interpreter Needed?: No  Information entered by :: mj Holt Woolbright, lpn   Activities of Daily Living    04/28/2022    3:57 PM  In your present state of health, do you have any difficulty performing the following activities:  Hearing? 1  Vision? 0  Difficulty concentrating or making decisions? 0  Walking or climbing stairs? 0  Dressing or bathing? 0  Doing errands, shopping? 0  Preparing Food and eating ? N  Using the Toilet? N  In the past six months, have you accidently leaked urine? N  Do you have problems with loss of bowel control? N  Managing your Medications? N  Managing your Finances? N  Housekeeping or managing your Housekeeping? N    Patient Care Team: Susy Frizzle, MD as PCP - General (Family Medicine) Nahser, [name], MD as PCP - Cardiology (Cardiology) Edythe Clarity, [facility] as Pharmacist (Pharmacist)  Indicate any recent Medical Services you may have received from other than [facility] providers in the past year (date may be approximate).     Assessment:   This is a  routine wellness examination for Ghazi.  Hearing/Vision screen Hearing Screening - Comments:: Some hearing issues.  Vision Screening - Comments:: Readers. Dr. Marica Otter. 2022.   Dietary issues and exercise activities discussed: Current Exercise Habits: Home exercise routine, Type of exercise: walking, Time (Minutes): 30, Frequency (Times/Week): 3, Weekly Exercise (Minutes/Week): 90, Intensity: Mild, Exercise limited by: cardiac condition(s)   Goals Addressed             This Visit's Progress    Exercise 150 min/wk Moderate Activity   Not on track    Enouraged more movement.        Depression Screen    04/28/2022    3:49 PM 05/14/2021   11:22 AM 04/09/2021    3:54 PM 10/27/2020    2:41 PM 04/01/2019   10:46 AM 03/26/2018    9:35 AM 03/26/2018    9:32 AM  PHQ 2/9 Scores  PHQ - 2 Score 0 0 0 0 0 0   Exception Documentation       Other- indicate reason in comment box  Not completed       Currently being treated    Fall Risk    04/28/2022    3:54 PM 09/01/2021    3:35 PM 05/14/2021   11:22 AM 04/09/2021    3:53 PM 04/01/2019   10:46 AM  Fall Risk   Falls in the past year? 0 0 0 0 0  Number falls in past yr: 0  0 0 0  Injury with Fall? 0  0 0   Risk for fall due to : No Fall Risks   No Fall Risks   Follow up Falls prevention discussed   Falls evaluation completed;Falls prevention discussed Falls evaluation completed    FALL RISK PREVENTION PERTAINING TO THE HOME:  Any stairs in or around the home? Yes  If so, are there any without handrails? No  Home free of loose throw rugs in walkways, pet beds, electrical cords, etc? Yes  Adequate lighting in your home to reduce risk of falls?  Yes   ASSISTIVE DEVICES UTILIZED TO PREVENT FALLS:  Life alert? No  Use of a cane, walker or w/c? No  Grab bars in the bathroom? Yes  Shower chair or bench in shower? No  Elevated toilet seat or a handicapped toilet? Yes   TIMED UP AND GO:  Was the test performed? No .    Cognitive  Function:        04/28/2022    3:57 PM  6CIT Screen  What Year? 0 points  What month? 0 points  What time? 0 points  Count back from 20 0 points  Months in reverse 0 points  Repeat phrase 0 points  Total Score 0 points    Immunizations Immunization History  Administered Date(s) Administered   Fluad Quad(high Dose 65+) 11/05/2019, 09/16/2020   Influenza, High Dose Seasonal PF 09/05/2018   Influenza,inj,Quad PF,6+ Mos 09/20/2013, 11/19/2014, 09/17/2015, 09/13/2016, 11/03/2017   Influenza-Unspecified 10/08/2021   Pneumococcal Conjugate-13 03/18/2016   Pneumococcal Polysaccharide-23 03/17/2015   Tdap 02/16/2011    TDAP status: Due, Education has been provided regarding the importance of this vaccine. Advised may receive this vaccine at local pharmacy or Health Dept. Aware to provide a copy of the vaccination record if obtained from local pharmacy or Health Dept. Verbalized acceptance and understanding.  Flu Vaccine status: Up to date  Pneumococcal vaccine status: Up to date  Covid-19 vaccine status: Declined, Education has been provided regarding the importance of this vaccine but patient still declined. Advised may receive this vaccine at local pharmacy or Health Dept.or vaccine clinic. Aware to provide a copy of the vaccination record if obtained from local pharmacy or Health Dept. Verbalized acceptance and understanding.  Qualifies for Shingles Vaccine? Yes   Zostavax completed Yes   Shingrix Completed?: Yes  Screening Tests Health Maintenance  Topic Date Due   Zoster Vaccines- Shingrix (1 of 2) Never done   TETANUS/TDAP  05/14/2022 (Originally 02/15/2021)   INFLUENZA VACCINE  06/28/2022   COLONOSCOPY (Pts 45-66yr Insurance coverage will need to be confirmed)  07/23/2024   Pneumonia Vaccine 72 Years old  Completed   Hepatitis C Screening  Completed   HPV VACCINES  Aged Out   COVID-19 Vaccine  Discontinued    Health Maintenance  Health Maintenance Due  Topic Date  Due   Zoster Vaccines- Shingrix (1 of 2) Never done    Colorectal cancer screening: Type of screening: Colonoscopy. Completed 07/23/2014. Repeat every 10 years  Lung Cancer Screening: (Low Dose CT Chest recommended if Age 463-80years, 30 pack-year currently smoking OR have quit w/in 15years.) does not qualify.   Additional Screening:  Hepatitis C Screening: does qualify; Completed 03/18/2016  Vision Screening: Recommended annual ophthalmology exams for early detection of glaucoma and other disorders of the eye. Is the patient up to date with their annual eye exam?  Yes  Who is the provider or what is the name of the office in which the patient attends annual eye exams? Dr. MSabra HeckIf pt is not established with a provider, would they like to be referred to a provider to establish care? No .   Dental Screening: Recommended annual dental exams for proper oral hygiene  Community Resource Referral / Chronic Care Management: CRR required this visit?  No   CCM required this visit?  No      Plan:     I have personally reviewed and noted the following in the patient's chart:   Medical and social history Use of alcohol, tobacco or illicit  drugs  Current medications and supplements including opioid prescriptions. Patient is not currently taking opioid prescriptions. Functional ability and status Nutritional status Physical activity Advanced directives List of other physicians Hospitalizations, surgeries, and ER visits in previous 12 months Vitals Screenings to include cognitive, depression, and falls Referrals and appointments  In addition, I have reviewed and discussed with patient certain preventive protocols, quality metrics, and best practice recommendations. A written personalized care plan for preventive services as well as general preventive health recommendations were provided to patient.     Chriss Driver, LPN   08/01/7124   Nurse Notes: Pt is up to date on vaccines  and health maintenance. Discussed TDAP and how to obtain.

## 2022-04-28 NOTE — Patient Instructions (Signed)
Walter Cross , Thank you for taking time to come for your Medicare Wellness Visit. I appreciate your ongoing commitment to your health goals. Please review the following plan we discussed and let me know if I can assist you in the future.   Screening recommendations/referrals: Colonoscopy: Done 07/23/2014 Repeat in 10 years  Recommended yearly ophthalmology/optometry visit for glaucoma screening and checkup Recommended yearly dental visit for hygiene and checkup  Vaccinations: Influenza vaccine: Done 10/08/2021 Repeat in 1 year. Pneumococcal vaccine: Done 03/18/2016 and 03/17/2015 Tdap vaccine: Done 02/16/2011. Repeat in 10 years  Shingles vaccine: Done 10/16/2020 and 01/20/2021.   Covid-19: Declined.  Advanced directives: Advance directive discussed with you today. I have provided a copy for you to complete at home and have notarized. Once this is complete please bring a copy in to our office so we can scan it into your chart. COPY MAILED TODAY.  Conditions/risks identified: Aim for 30 minutes of exercise or brisk walking, 6-8 glasses of water, and 5 servings of fruits and vegetables each day.   Next appointment: Follow up in one year for your annual wellness visit. 2024.  Preventive Care 72 Years and Older, Male  Preventive care refers to lifestyle choices and visits with your health care provider that can promote health and wellness. What does preventive care include? A yearly physical exam. This is also called an annual well check. Dental exams once or twice a year. Routine eye exams. Ask your health care provider how often you should have your eyes checked. Personal lifestyle choices, including: Daily care of your teeth and gums. Regular physical activity. Eating a healthy diet. Avoiding tobacco and drug use. Limiting alcohol use. Practicing safe sex. Taking low doses of aspirin every day. Taking vitamin and mineral supplements as recommended by your health care  provider. What happens during an annual well check? The services and screenings done by your health care provider during your annual well check will depend on your age, overall health, lifestyle risk factors, and family history of disease. Counseling  Your health care provider may ask you questions about your: Alcohol use. Tobacco use. Drug use. Emotional well-being. Home and relationship well-being. Sexual activity. Eating habits. History of falls. Memory and ability to understand (cognition). Work and work Statistician. Screening  You may have the following tests or measurements: Height, weight, and BMI. Blood pressure. Lipid and cholesterol levels. These may be checked every 5 years, or more frequently if you are over 54 years old. Skin check. Lung cancer screening. You may have this screening every year starting at age 72 if you have a 30-pack-year history of smoking and currently smoke or have quit within the past 15 years. Fecal occult blood test (FOBT) of the stool. You may have this test every year starting at age 72. Flexible sigmoidoscopy or colonoscopy. You may have a sigmoidoscopy every 5 years or a colonoscopy every 10 years starting at age 72. Prostate cancer screening. Recommendations will vary depending on your family history and other risks. Hepatitis C blood test. Hepatitis B blood test. Sexually transmitted disease (STD) testing. Diabetes screening. This is done by checking your blood sugar (glucose) after you have not eaten for a while (fasting). You may have this done every 1-3 years. Abdominal aortic aneurysm (AAA) screening. You may need this if you are a current or former smoker. Osteoporosis. You may be screened starting at age 72 if you are at high risk. Talk with your health care provider about your test results, treatment options,  and if necessary, the need for more tests. Vaccines  Your health care provider may recommend certain vaccines, such  as: Influenza vaccine. This is recommended every year. Tetanus, diphtheria, and acellular pertussis (Tdap, Td) vaccine. You may need a Td booster every 10 years. Zoster vaccine. You may need this after age 75. Pneumococcal 13-valent conjugate (PCV13) vaccine. One dose is recommended after age 72. Pneumococcal polysaccharide (PPSV23) vaccine. One dose is recommended after age 72. Talk to your health care provider about which screenings and vaccines you need and how often you need them. This information is not intended to replace advice given to you by your health care provider. Make sure you discuss any questions you have with your health care provider. Document Released: 12/11/2015 Document Revised: 08/03/2016 Document Reviewed: 09/15/2015 Elsevier Interactive Patient Education  2017 [place] Prevention in the Home Falls can cause injuries. They can happen to people of all ages. There are many things you can do to make your home safe and to help prevent falls. What can I do on the outside of my home? Regularly fix the edges of walkways and driveways and fix any cracks. Remove anything that might make you trip as you walk through a door, such as a raised step or threshold. Trim any bushes or trees on the path to your home. Use bright outdoor lighting. Clear any walking paths of anything that might make someone trip, such as rocks or tools. Regularly check to see if handrails are loose or broken. Make sure that both sides of any steps have handrails. Any raised decks and porches should have guardrails on the edges. Have any leaves, snow, or ice cleared regularly. Use sand or salt on walking paths during winter. Clean up any spills in your garage right away. This includes oil or grease spills. What can I do in the bathroom? Use night lights. Install grab bars by the toilet and in the tub and shower. Do not use towel bars as grab bars. Use non-skid mats or decals in the tub or  shower. If you need to sit down in the shower, use a plastic, non-slip stool. Keep the floor dry. Clean up any water that spills on the floor as soon as it happens. Remove soap buildup in the tub or shower regularly. Attach bath mats securely with double-sided non-slip rug tape. Do not have throw rugs and other things on the floor that can make you trip. What can I do in the bedroom? Use night lights. Make sure that you have a light by your bed that is easy to reach. Do not use any sheets or blankets that are too big for your bed. They should not hang down onto the floor. Have a firm chair that has side arms. You can use this for support while you get dressed. Do not have throw rugs and other things on the floor that can make you trip. What can I do in the kitchen? Clean up any spills right away. Avoid walking on wet floors. Keep items that you use a lot in easy-to-reach places. If you need to reach something above you, use a strong step stool that has a grab bar. Keep electrical cords out of the way. Do not use floor polish or wax that makes floors slippery. If you must use wax, use non-skid floor wax. Do not have throw rugs and other things on the floor that can make you trip. What can I do with my stairs? Do not leave  any items on the stairs. Make sure that there are handrails on both sides of the stairs and use them. Fix handrails that are broken or loose. Make sure that handrails are as long as the stairways. Check any carpeting to make sure that it is firmly attached to the stairs. Fix any carpet that is loose or worn. Avoid having throw rugs at the top or bottom of the stairs. If you do have throw rugs, attach them to the floor with carpet tape. Make sure that you have a light switch at the top of the stairs and the bottom of the stairs. If you do not have them, ask someone to add them for you. What else can I do to help prevent falls? Wear shoes that: Do not have high heels. Have  rubber bottoms. Are comfortable and fit you well. Are closed at the toe. Do not wear sandals. If you use a stepladder: Make sure that it is fully opened. Do not climb a closed stepladder. Make sure that both sides of the stepladder are locked into place. Ask someone to hold it for you, if possible. Clearly mark and make sure that you can see: Any grab bars or handrails. First and last steps. Where the edge of each step is. Use tools that help you move around (mobility aids) if they are needed. These include: Canes. Walkers. Scooters. Crutches. Turn on the lights when you go into a dark area. Replace any light bulbs as soon as they burn out. Set up your furniture so you have a clear path. Avoid moving your furniture around. If any of your floors are uneven, fix them. If there are any pets around you, be aware of where they are. Review your medicines with your doctor. Some medicines can make you feel dizzy. This can increase your chance of falling. Ask your doctor what other things that you can do to help prevent falls. This information is not intended to replace advice given to you by your health care provider. Make sure you discuss any questions you have with your health care provider. Document Released: 09/10/2009 Document Revised: 04/21/2016 Document Reviewed: 12/19/2014 Elsevier Interactive Patient Education  2017 [employer].

## 2022-04-29 NOTE — Progress Notes (Signed)
Chronic Care Management Pharmacy Note  05/05/2022 Name:  Walter Cross MRN:  [id number] DOB:  Aug 06, 1951  Summary: Had to stop doxazosin and restarted Flomax due to leg swelling.  BP still fluctuating with most systolic > 314.  Plans to get serious about walking to see if this helps with BP.  Will monitor and record at home.  Recommendations/Changes made from today's visit: Fu BP in 4 weeks - may need to add HCTZ  Plan: FU 4 months   Subjective: Walter Cross is an 71 y.o. year old male who is a primary patient of Pickard, Cammie Mcgee, MD.  The CCM team was consulted for assistance with disease management and care coordination needs.    Engaged with patient face to face for follow up visit in response to provider referral for pharmacy case management and/or care coordination services.   Consent to Services:  The patient was given the following information about Chronic Care Management services today, agreed to services, and gave verbal consent: 1. CCM service includes personalized support from designated clinical staff supervised by the primary care provider, including individualized plan of care and coordination with other care providers 2. 24/7 contact phone numbers for assistance for urgent and routine care needs. 3. Service will only be billed when office clinical staff spend 20 minutes or more in a month to coordinate care. 4. Only one practitioner may furnish and bill the service in a calendar month. 5.The patient may stop CCM services at any time (effective at the end of the month) by phone call to the office staff. 6. The patient will be responsible for cost sharing (co-pay) of up to 20% of the service fee (after annual deductible is met). Patient agreed to services and consent obtained.  Patient Care Team: Susy Frizzle, MD as PCP - General (Family Medicine) Nahser, [name], MD as PCP - Cardiology (Cardiology) Edythe Clarity, [facility] as Pharmacist  (Pharmacist)  Recent office visits:  05/14/21 Eulogio Bear, NP. For long term use of anticoagulants. No medication changes. Per note:Plan to resume Plavix indefinitely for history of TIA. 01/12/21 Dr. Dennard Schaumann For follow-up. No medication changes. Per note: The doctor recommended that he switch to Plavix 75 mg daily alone and discontinue the aspirin as recommended by his neurologist.   Recent consult visits:  06/14/21 Cardiology Nahser, Arnette Norris - no changes to meds, lipids well controlled.  BP slightly elevated he wants to fix with diet/exercise before adding any medications.  02/24/21 Neurology Frann Rider, NP. For follow-up. STOPPED Aspirin.   Hospital visits:  05/12/21 [facility] Emergency Department (5 Hours) Lacretia Leigh, MD. For epistaxis. Per note: EKG and labs preformed.   Medication History: Ramipril 10 mg 90 DS 03/16/21 Atorvastatin 40 mg 90 DS 03/13/21   Objective:  Lab Results  Component Value Date   CREATININE 0.82 04/04/2022   BUN 8 04/04/2022   GFR 96.72 04/16/2013   GFRNONAA >60 05/12/2021   GFRAA 100 12/04/2020   NA 137 04/04/2022   K 4.4 04/04/2022   CALCIUM 9.3 04/04/2022   CO2 30 04/04/2022   GLUCOSE 112 (H) 04/04/2022    Lab Results  Component Value Date/Time   HGBA1C 5.5 09/26/2020 03:06 AM   HGBA1C 5.8 (H) 03/17/2015 09:56 AM   GFR 96.72 04/16/2013 09:21 AM    Last diabetic Eye exam: No results found for: "HMDIABEYEEXA"  Last diabetic Foot exam: No results found for: "HMDIABFOOTEX"   Lab Results  Component Value Date  CHOL 155 04/04/2022   HDL 72 04/04/2022   LDLCALC 62 04/04/2022   LDLDIRECT 115.6 04/16/2013   TRIG 125 04/04/2022   CHOLHDL 2.2 04/04/2022       Latest Ref Rng & Units 04/04/2022    2:33 PM 03/08/2021   11:09 AM 12/04/2020    3:19 PM  Hepatic Function  Total Protein 6.1 - 8.1 g/dL 6.3  6.6  7.0   Albumin 3.7 - 4.7 g/dL  4.4  4.3   AST 10 - 35 U/L _0 ALT 9 - 46 U/L _1 Alk  Phosphatase 44 - 121 IU/L  91  90   Total Bilirubin 0.2 - 1.2 mg/dL 0.8  0.5  0.8   Bilirubin, Direct 0.00 - 0.40 mg/dL  0.20  0.27     Lab Results  Component Value Date/Time   TSH 1.48 03/24/2017 09:59 AM   TSH 2.402 03/11/2014 09:40 AM       Latest Ref Rng & Units 04/04/2022    2:33 PM 05/12/2021    5:45 PM 09/25/2020    9:54 PM  CBC  WBC 3.8 - 10.8 Thousand/uL 5.9  6.9    Hemoglobin 13.2 - 17.1 g/dL 14.7  14.9  15.6   Hematocrit 38.5 - 50.0 % 42.5  42.8  46.0   Platelets 140 - 400 Thousand/uL 205  251      No results found for: "VD25OH"  Clinical ASCVD: No  The 10-year ASCVD risk score (Arnett DK, et al., 2019) is: 36.3%   Values used to calculate the score:     Age: 71 years     Sex: Male     Is Non-Hispanic African American: No     Diabetic: No     Tobacco smoker: No     Systolic Blood Pressure: 817 mmHg     Is BP treated: Yes     HDL Cholesterol: 72 mg/dL     Total Cholesterol: 155 mg/dL       04/28/2022    3:49 PM 05/14/2021   11:22 AM 04/09/2021    3:54 PM  Depression screen PHQ 2/9  Decreased Interest 0 0 0  Down, Depressed, Hopeless 0 0 0  PHQ - 2 Score 0 0 0     Social History   Tobacco Use  Smoking Status Former   Packs/day: 1.00   Years: 7.00   Total pack years: 7.00   Types: Cigarettes   Quit date: 11/29/1975   Years since quitting: 46.4  Smokeless Tobacco Never   BP Readings from Last 3 Encounters:  04/04/22 (!) 148/91  09/01/21 (!) 152/84  06/14/21 (!) 142/78   Pulse Readings from Last 3 Encounters:  04/04/22 85  09/01/21 84  06/14/21 77   Wt Readings from Last 3 Encounters:  04/28/22 174 lb (78.9 kg)  04/04/22 174 lb 9.6 oz (79.2 kg)  09/01/21 175 lb (79.4 kg)   BMI Readings from Last 3 Encounters:  04/28/22 25.70 kg/m  04/04/22 25.78 kg/m  09/01/21 25.84 kg/m    Assessment/Interventions: Review of patient past medical history, allergies, medications, health status, including review of consultants reports, laboratory and  other test data, was performed as part of comprehensive evaluation and provision of chronic care management services.   SDOH:  (Social Determinants of Health) assessments and interventions performed: Yes  Financial Resource Strain: Low Risk  (04/28/2022)   Overall Financial Resource Strain (CARDIA)    Difficulty of Paying  Living Expenses: Not hard at all    SDOH Screenings   Alcohol Screen: Low Risk  (04/28/2022)   Alcohol Screen    Last Alcohol Screening Score (AUDIT): 3  Depression (PHQ2-9): Low Risk  (04/28/2022)   Depression (PHQ2-9)    PHQ-2 Score: 0  Financial Resource Strain: Low Risk  (04/28/2022)   Overall Financial Resource Strain (CARDIA)    Difficulty of Paying Living Expenses: Not hard at all  Food Insecurity: No Food Insecurity (04/28/2022)   Hunger Vital Sign    Worried About Running Out of Food in the Last Year: Never true    [place] in the Last Year: Never true  Housing: Low Risk  (04/28/2022)   Housing    Last Housing Risk Score: 0  Physical Activity: Insufficiently Active (04/28/2022)   Exercise Vital Sign    Days of Exercise per Week: 3 days    Minutes of Exercise per Session: 30 min  Social Connections: Socially Isolated (04/28/2022)   Social Connection and Isolation Panel [NHANES]    Frequency of Communication with Friends and Family: More than three times a week    Frequency of Social Gatherings with Friends and Family: Twice a week    Attends Religious Services: Never    Marine scientist or Organizations: No    Attends Archivist Meetings: Never    Marital Status: Widowed  Stress: No Stress Concern Present (04/28/2022)   [place]    Feeling of Stress : Not at all  Tobacco Use: Medium Risk (04/28/2022)   Patient History    Smoking Tobacco Use: Former    Smokeless Tobacco Use: Never    Passive Exposure: Not on file  Transportation Needs: Unknown (04/28/2022)   PRAPARE -  Transportation    Lack of Transportation (Medical): No    Lack of Transportation (Non-Medical): Not on file    CCM Care Plan  Allergies  Allergen Reactions   Sulfa Antibiotics     Pt reports causes aches all over   Esomeprazole Magnesium Other (See Comments)    cramps   Septra [Bactrim] Other (See Comments)    Back ache   Sulfamethoxazole-Trimethoprim     Other reaction(s): Other (See Comments) Back ache Back ache Back ache     Medications Reviewed Today     Reviewed by Edythe Clarity, [facility] (Pharmacist) on 05/05/22 at 1112  Med List Status: <None>   Medication Order Taking? Sig Documenting Provider Last Dose Status Informant  acyclovir ointment (ZOVIRAX) 5 % [id number] Yes Apply 1 application topically daily as needed (cold sores & herpes). Susy Frizzle, MD Taking Active Self  atorvastatin (LIPITOR) 40 MG tablet [id number] Yes TAKE 1 TABLET(40 MG) BY MOUTH DAILY Nahser, [name], MD Taking Active   carvedilol (COREG) 12.5 MG tablet [id number] Yes TAKE 1 TABLET(12.5 MG) BY MOUTH TWICE DAILY WITH A MEAL Nahser, [name], MD Taking Active   Cholecalciferol (VITAMIN D) 50 MCG (2000 UT) CAPS [id number] Yes Take 1 capsule by mouth daily in the afternoon. [provider] Taking Active   clonazePAM (KLONOPIN) 0.5 MG tablet [id number] Yes TAKE 1 TABLET(0.5 MG) BY MOUTH AT BEDTIME AS NEEDED FOR ANXIETY OR SLEEP Susy Frizzle, MD Taking Active   clopidogrel (PLAVIX) 75 MG tablet [id number] Yes Take 1 tablet (75 mg total) by mouth daily. Susy Frizzle, MD Taking Active   Cyanocobalamin (VITAMIN B-12) 1000 MCG SUBL [id number] Yes Place 1 tablet under  the tongue daily.  [provider] Taking Active Self  diltiazem (CARTIA XT) 180 MG 24 hr capsule [id number] Yes TAKE 1 CAPSULE(180 MG) BY MOUTH DAILY Nahser, [name], MD Taking Active   doxazosin (CARDURA) 4 MG tablet [id number] Yes Take 1 tablet (4 mg total) by mouth daily. Stop tamsulosin Susy Frizzle, MD Taking  Active   folic acid (FOLVITE) 1 MG tablet [id number] Yes TAKE 2 TABLETS(2 MG) BY MOUTH DAILY Susy Frizzle, MD Taking Active   ibuprofen (ADVIL,MOTRIN) 200 MG tablet [id number] Yes Take 200 mg by mouth every 4 (four) hours as needed for headache, mild pain or moderate pain.  Liliane Shi, PA-C Taking Active Self  nystatin-triamcinolone (MYCOLOG II) cream [id number] Yes Apply 1 application topically as needed. [provider] Taking Active Self  pantoprazole (PROTONIX) 40 MG tablet [id number] Yes Take 1 tablet (40 mg total) by mouth daily. Susy Frizzle, MD Taking Active   ramipril (ALTACE) 10 MG capsule [id number] Yes Take 1 capsule (10 mg total) by mouth daily. PT NEEDS OV AND LABS Susy Frizzle, MD Taking Active   sildenafil (REVATIO) 20 MG tablet [id number] Yes 3-5 tablets daily as needed Nahser, [name], MD Taking Active Self  venlafaxine XR (EFFEXOR-XR) 150 MG 24 hr capsule [id number] Yes TAKE 1 CAPSULE(150 MG) BY MOUTH DAILY WITH BREAKFAST Susy Frizzle, MD Taking Active             Patient Active Problem List   Diagnosis Date Noted   Macrocytosis without anemia 78/93/8101   Metabolic acidosis 75/08/2584   TIA (transient ischemic attack) 09/25/2020   HTN (hypertension) 10/21/2013   Insomnia 07/04/2013   Chest heaviness 09/23/2012   Chest pain 08/02/2012   Anxiety and depression 08/02/2012   Left ventricular hypertrophy 08/25/2011    Immunization History  Administered Date(s) Administered   Fluad Quad(high Dose 65+) 11/05/2019, 09/16/2020   Influenza, High Dose Seasonal PF 09/05/2018   Influenza,inj,Quad PF,6+ Mos 09/20/2013, 11/19/2014, 09/17/2015, 09/13/2016, 11/03/2017   Influenza-Unspecified 10/08/2021   Pneumococcal Conjugate-13 03/18/2016   Pneumococcal Polysaccharide-23 03/17/2015   Tdap 02/16/2011    Conditions to be addressed/monitored:  HTN, HX of TIA, Depression/Anxiety, HLD, Insomnia  Care Plan : General Pharmacy (Adult)  Updates made  by Edythe Clarity, [place] since 05/05/2022 12:00 AM     Problem: HTN, HX of TIA, Depression/Anxiety, HLD, Insomnia   Priority: High  Onset Date: 06/18/2021     Long-Range Goal: Patient-Specific Goal   Start Date: 06/18/2021  Expected End Date: 12/19/2021  Recent Progress: On track  Priority: High  Note:   Current Barriers:  BP/Depression  Pharmacist Clinical Goal(s):  Patient will achieve control of daytime sleepiness as evidenced by symptoms through collaboration with PharmD and provider.   Interventions: 1:1 collaboration with Susy Frizzle, MD regarding development and update of comprehensive plan of care as evidenced by provider attestation and co-signature Inter-disciplinary care team collaboration (see longitudinal plan of care) Comprehensive medication review performed; medication list updated in electronic medical record  Hypertension (BP goal <140/90) -Controlled -Current treatment: Ramipril 35m daily Appropriate, Query effective,   Diltiazem 180 CD daily Appropriate, Query effective,  Carvedilol 12.576mBID Appropriate, Query effective, -Medications previously tried: none noted  -Current home readings: does not monitor all the time but reports it has been normal to on the high side  -Current exercise habits: not much lately, has recently wanted to be more active and started walking today actually.  Plans to increase physical activity -Denies hypotensive/hypertensive  symptoms - however does report he is very tired all of the time -Educated on BP goals and benefits of medications for prevention of heart attack, stroke and kidney damage; Daily salt intake goal < 2300 mg; Exercise goal of 150 minutes per week; Importance of home blood pressure monitoring; Symptoms of hypotension and importance of maintaining adequate hydration; -Counseled to monitor BP at home weekly, document, and provide log at future appointments -Recommended to continue current  medication Recommended he try switching Diltiazem to night time dosing to see if splitting up his daytime BP meds helps with daytime sleepiness.  Update 10/28/21 Tried switching diltiazem to bedtime this did not seem to help with sleepiness during the daytime.  His BP remains uncontrolled.  He reports systolic in the 765Y and diastolic mostly in the 65K.  He has not been checking at home the past few weeks, however.  If BP remains elevated, he may benefit from addition of HCTZ 39m daily.  Will check in on BP in about 2-3 weeks to see how it is doing at home. Continue meds for now.  Update 05/05/22 Had to stop the doxazosin due to some swelling in the legs. Plans to get serious about walking an exercise and plans to walk at least 2-3 days per week. Reports pressure at home is up and down. Most systolic readings are > 1354 We will let him try the exercise route for about 1 month and then check in to see how BP is running. Could still consider addition of HCTZ to current regimen,   Hyperlipidemia: (LDL goal < 70) -Controlled -Current treatment: Atorvastatin 447mdaily -Medications previously tried: none noted  -Educated on Cholesterol goals;  Benefits of statin for ASCVD risk reduction; Importance of limiting foods high in cholesterol; Exercise goal of 150 minutes per week; Strategies to manage statin-induced myalgias; -Most recent LDL is excellent - patient is concerned his tiredness may be coming from statin as well as memory loss.  Stressed importance of these medications on reducing cardiovascular events, he is agreeable to continue. -Recommended to continue current medication Patient interested in decreasing dose and plans to follow up with cardiology on this.    Depression/Anxiety (Goal: Minimize symptoms) -Controlled -Current treatment: Venlafaxine ER 15069maily Clonazepam 0.5mg14mn -Medications previously tried/failed: Lexapro -PHQ9:  [facility]  [facility] 11     -GAD7: No flowsheet data found. -Educated on Benefits of medication for symptom control -Patient admits there still could be symptoms of depression, ie sleeping all day and late in the morning.  Wants to try exercise to see if this helps. -Recommended to continue current medication Encouraged him to let me know or come see Dr. PickDennard Schaumannsymptoms worsen or do not improve with exercise.  Update 10/28/21 From discussion it appears patient may have some untreated depression leading to lack of motivation.  He reports an overall lack of interest or motivation in getting up to do things.  Wants to get out and exercise just has not been able to do so.  Denies any suicidal ideation or thoughts of self harm.  Plans to makes appointment with PCP first of the year on this. Continue meds for now - may benefit from switch to Zoloft or Wellbutrin to see how this help. Contact providers with any new or worsening symptoms.  Update 05/05/22 Seems to have more motivation at today's visit.  However does report that he has days where he likes  to sleep a lot of probably more than he should. Plans to increase his exercise to see if this helps with his overall mood. Will check in about a month from now to see how he is feeling. Could consider switch to another therapy if still feeling symptoms of depression.  Insomnia (Goal: Reduce symptoms) -Controlled -Current treatment  None noted -Medications previously tried: none -No reports of sleeping disturbances, in fact he sleeps too much per his wife and his own reports -Could be some untreated depression - patient aware to follow up if it does not improve  -Recommended continue current management strategies  Patient Goals/Self-Care Activities Patient will:  - take medications as prescribed focus on medication adherence by pill box check blood pressure a few times per week, document, and  provide at future appointments  Follow Up Plan: The care management team will reach out to the patient again over the next 120 days.             Medication Assistance: None required.  Patient affirms current coverage meets needs.  Compliance/Adherence/Medication fill history: Care Gaps: None at this time  Star-Rating Drugs: Ramipril 10 mg 90 DS 03/16/21 Atorvastatin 40 mg 90 DS 03/13/21  Patient's preferred pharmacy is:  [facility], [address] [place] [address] Phone: [phone] Fax: [phone]  [id number] [address], [address] [place]. [place] [address] Phone: [phone] Fax: [facility], [address] AT [address] [place] [address] Phone: [phone] Fax: [phone]   Uses pill box? Yes Pt endorses 100% compliance  We discussed: Benefits of medication synchronization, packaging and delivery as well as enhanced pharmacist oversight with [employer]. Patient decided to: Continue current medication management strategy  Care Plan and Follow Up Patient Decision:  Patient agrees to Care Plan and Follow-up.  Plan: The care management team will reach out to the patient again over the next 120 days.  Beverly Milch, PharmD, CPP Clinical Pharmacist Practitioner [place] [phone]

## 2022-05-02 DIAGNOSIS — Z8673 Personal history of transient ischemic attack (TIA), and cerebral infarction without residual deficits: Secondary | ICD-10-CM | POA: Diagnosis not present

## 2022-05-02 DIAGNOSIS — K22711 Barrett's esophagus with high grade dysplasia: Secondary | ICD-10-CM | POA: Diagnosis not present

## 2022-05-02 DIAGNOSIS — Z7902 Long term (current) use of antithrombotics/antiplatelets: Secondary | ICD-10-CM | POA: Diagnosis not present

## 2022-05-02 DIAGNOSIS — K449 Diaphragmatic hernia without obstruction or gangrene: Secondary | ICD-10-CM | POA: Diagnosis not present

## 2022-05-02 DIAGNOSIS — F32A Depression, unspecified: Secondary | ICD-10-CM | POA: Diagnosis not present

## 2022-05-02 DIAGNOSIS — Z87891 Personal history of nicotine dependence: Secondary | ICD-10-CM | POA: Diagnosis not present

## 2022-05-02 DIAGNOSIS — K22710 Barrett's esophagus with low grade dysplasia: Secondary | ICD-10-CM | POA: Diagnosis not present

## 2022-05-02 DIAGNOSIS — I10 Essential (primary) hypertension: Secondary | ICD-10-CM | POA: Diagnosis not present

## 2022-05-02 DIAGNOSIS — Z79899 Other long term (current) drug therapy: Secondary | ICD-10-CM | POA: Diagnosis not present

## 2022-05-02 DIAGNOSIS — E785 Hyperlipidemia, unspecified: Secondary | ICD-10-CM | POA: Diagnosis not present

## 2022-05-05 ENCOUNTER — Ambulatory Visit: Payer: BLUE CROSS/BLUE SHIELD

## 2022-05-05 DIAGNOSIS — F419 Anxiety disorder, unspecified: Secondary | ICD-10-CM

## 2022-05-05 DIAGNOSIS — I10 Essential (primary) hypertension: Secondary | ICD-10-CM

## 2022-05-05 NOTE — Patient Instructions (Addendum)
Visit Information   Goals Addressed             This Visit's Progress    Exercise 150 min/wk Moderate Activity   On track    Enouraged more movement.        Patient Care Plan: General Pharmacy (Adult)     Problem Identified: HTN, HX of TIA, Depression/Anxiety, HLD, Insomnia   Priority: High  Onset Date: 06/18/2021     Long-Range Goal: Patient-Specific Goal   Start Date: 06/18/2021  Expected End Date: 12/19/2021  Recent Progress: On track  Priority: High  Note:   Current Barriers:  BP/Depression  Pharmacist Clinical Goal(s):  Patient will achieve control of daytime sleepiness as evidenced by symptoms through collaboration with PharmD and provider.   Interventions: 1:1 collaboration with Susy Frizzle, MD regarding development and update of comprehensive plan of care as evidenced by provider attestation and co-signature Inter-disciplinary care team collaboration (see longitudinal plan of care) Comprehensive medication review performed; medication list updated in electronic medical record  Hypertension (BP goal <140/90) -Controlled -Current treatment: Ramipril '10mg'$  daily Appropriate, Query effective,   Diltiazem 180 CD daily Appropriate, Query effective,  Carvedilol 12.'5mg'$  BID Appropriate, Query effective, -Medications previously tried: none noted  -Current home readings: does not monitor all the time but reports it has been normal to on the high side  -Current exercise habits: not much lately, has recently wanted to be more active and started walking today actually.  Plans to increase physical activity -Denies hypotensive/hypertensive symptoms - however does report he is very tired all of the time -Educated on BP goals and benefits of medications for prevention of heart attack, stroke and kidney damage; Daily salt intake goal < 2300 mg; Exercise goal of 150 minutes per week; Importance of home blood pressure monitoring; Symptoms of hypotension and importance of  maintaining adequate hydration; -Counseled to monitor BP at home weekly, document, and provide log at future appointments -Recommended to continue current medication Recommended he try switching Diltiazem to night time dosing to see if splitting up his daytime BP meds helps with daytime sleepiness.  Update 10/28/21 Tried switching diltiazem to bedtime this did not seem to help with sleepiness during the daytime.  His BP remains uncontrolled.  He reports systolic in the 998P and diastolic mostly in the 38S.  He has not been checking at home the past few weeks, however.  If BP remains elevated, he may benefit from addition of HCTZ '25mg'$  daily.  Will check in on BP in about 2-3 weeks to see how it is doing at home. Continue meds for now.  Update 05/05/22 Had to stop the doxazosin due to some swelling in the legs. Plans to get serious about walking an exercise and plans to walk at least 2-3 days per week. Reports pressure at home is up and down. Most systolic readings are > 505. We will let him try the exercise route for about 1 month and then check in to see how BP is running. Could still consider addition of HCTZ to current regimen,   Hyperlipidemia: (LDL goal < 70) -Controlled -Current treatment: Atorvastatin '40mg'$  daily -Medications previously tried: none noted  -Educated on Cholesterol goals;  Benefits of statin for ASCVD risk reduction; Importance of limiting foods high in cholesterol; Exercise goal of 150 minutes per week; Strategies to manage statin-induced myalgias; -Most recent LDL is excellent - patient is concerned his tiredness may be coming from statin as well as memory loss.  Stressed importance of these medications  on reducing cardiovascular events, he is agreeable to continue. -Recommended to continue current medication Patient interested in decreasing dose and plans to follow up with cardiology on this.    Depression/Anxiety (Goal: Minimize symptoms) -Controlled -Current  treatment: Venlafaxine ER '150mg'$  daily Clonazepam 0.'5mg'$  prn -Medications previously tried/failed: Lexapro -PHQ9:  [place] Office Visit from 11/03/2017 in [place]  PHQ-9 Total Score 11     -GAD7: No flowsheet data found. -Educated on Benefits of medication for symptom control -Patient admits there still could be symptoms of depression, ie sleeping all day and late in the morning.  Wants to try exercise to see if this helps. -Recommended to continue current medication Encouraged him to let me know or come see Dr. Dennard Schaumann if symptoms worsen or do not improve with exercise.  Update 10/28/21 From discussion it appears patient may have some untreated depression leading to lack of motivation.  He reports an overall lack of interest or motivation in getting up to do things.  Wants to get out and exercise just has not been able to do so.  Denies any suicidal ideation or thoughts of self harm.  Plans to makes appointment with PCP first of the year on this. Continue meds for now - may benefit from switch to Zoloft or Wellbutrin to see how this help. Contact providers with any new or worsening symptoms.  Update 05/05/22 Seems to have more motivation at today's visit.  However does report that he has days where he likes to sleep a lot of probably more than he should. Plans to increase his exercise to see if this helps with his overall mood. Will check in about a month from now to see how he is feeling. Could consider switch to another therapy if still feeling symptoms of depression.  Insomnia (Goal: Reduce symptoms) -Controlled -Current treatment  None noted -Medications previously tried: none -No reports of sleeping disturbances, in fact he sleeps too much per his wife and his own reports -Could be some untreated depression - patient aware to follow up if it does not improve  -Recommended continue current management strategies  Patient Goals/Self-Care  Activities Patient will:  - take medications as prescribed focus on medication adherence by pill box check blood pressure a few times per week, document, and provide at future appointments  Follow Up Plan: The care management team will reach out to the patient again over the next 120 days.           The patient verbalized understanding of instructions, educational materials, and care plan provided today and DECLINED offer to receive copy of patient instructions, educational materials, and care plan.  Telephone follow up appointment with pharmacy team member scheduled for: 4 months  Edythe Clarity, [place], PharmD, [place] Clinical Pharmacist Practitioner [place] [phone]

## 2022-05-23 ENCOUNTER — Other Ambulatory Visit: Payer: Self-pay | Admitting: Family Medicine

## 2022-05-23 ENCOUNTER — Encounter: Payer: Self-pay | Admitting: Family Medicine

## 2022-05-23 MED ORDER — PREDNISONE 20 MG PO TABS: ORAL_TABLET | ORAL | 0 refills | Status: DC

## 2022-06-06 ENCOUNTER — Encounter: Payer: Self-pay | Admitting: Family Medicine

## 2022-06-07 DIAGNOSIS — K22711 Barrett's esophagus with high grade dysplasia: Secondary | ICD-10-CM | POA: Diagnosis not present

## 2022-06-07 DIAGNOSIS — K219 Gastro-esophageal reflux disease without esophagitis: Secondary | ICD-10-CM | POA: Diagnosis not present

## 2022-06-07 DIAGNOSIS — Z86010 Personal history of colonic polyps: Secondary | ICD-10-CM | POA: Diagnosis not present

## 2022-06-08 ENCOUNTER — Telehealth: Payer: Self-pay | Admitting: Pharmacist

## 2022-06-08 NOTE — Progress Notes (Signed)
Chronic Care Management Pharmacy Assistant   Name: Walter Cross  MRN: [id number] DOB: 07-29-1951   Reason for Encounter: Hypertension Adherence Call    Recent office visits:  None  Recent consult visits:  None  Hospital visits:  None in previous 6 months  Medications: Outpatient Encounter Medications as of 06/08/2022  Medication Sig   acyclovir ointment (ZOVIRAX) 5 % Apply 1 application topically daily as needed (cold sores & herpes).   atorvastatin (LIPITOR) 40 MG tablet TAKE 1 TABLET(40 MG) BY MOUTH DAILY   carvedilol (COREG) 12.5 MG tablet TAKE 1 TABLET(12.5 MG) BY MOUTH TWICE DAILY WITH A MEAL   Cholecalciferol (VITAMIN D) 50 MCG (2000 UT) CAPS Take 1 capsule by mouth daily in the afternoon.   clonazePAM (KLONOPIN) 0.5 MG tablet TAKE 1 TABLET(0.5 MG) BY MOUTH AT BEDTIME AS NEEDED FOR ANXIETY OR SLEEP   clopidogrel (PLAVIX) 75 MG tablet Take 1 tablet (75 mg total) by mouth daily.   Cyanocobalamin (VITAMIN B-12) 1000 MCG SUBL Place 1 tablet under the tongue daily.    diltiazem (CARTIA XT) 180 MG 24 hr capsule TAKE 1 CAPSULE(180 MG) BY MOUTH DAILY   doxazosin (CARDURA) 4 MG tablet Take 1 tablet (4 mg total) by mouth daily. Stop tamsulosin   folic acid (FOLVITE) 1 MG tablet TAKE 2 TABLETS(2 MG) BY MOUTH DAILY   ibuprofen (ADVIL,MOTRIN) 200 MG tablet Take 200 mg by mouth every 4 (four) hours as needed for headache, mild pain or moderate pain.    nystatin-triamcinolone (MYCOLOG II) cream Apply 1 application topically as needed.   pantoprazole (PROTONIX) 40 MG tablet Take 1 tablet (40 mg total) by mouth daily.   predniSONE (DELTASONE) 20 MG tablet 3 tabs poqday 1-2, 2 tabs poqday 3-4, 1 tab poqday 5-6   ramipril (ALTACE) 10 MG capsule Take 1 capsule (10 mg total) by mouth daily. PT NEEDS OV AND LABS   sildenafil (REVATIO) 20 MG tablet 3-5 tablets daily as needed   venlafaxine XR (EFFEXOR-XR) 150 MG 24 hr capsule TAKE 1 CAPSULE(150 MG) BY MOUTH DAILY WITH BREAKFAST   No  facility-administered encounter medications on file as of 06/08/2022.   Reviewed chart prior to disease state call. Spoke with patient regarding BP  Recent Office Vitals: BP Readings from Last 3 Encounters:  04/04/22 (!) 148/91  09/01/21 (!) 152/84  06/14/21 (!) 142/78   Pulse Readings from Last 3 Encounters:  04/04/22 85  09/01/21 84  06/14/21 77    Wt Readings from Last 3 Encounters:  04/28/22 174 lb (78.9 kg)  04/04/22 174 lb 9.6 oz (79.2 kg)  09/01/21 175 lb (79.4 kg)     Kidney Function Lab Results  Component Value Date/Time   CREATININE 0.82 04/04/2022 02:33 PM   CREATININE 0.78 05/12/2021 05:45 PM   CREATININE 0.89 12/04/2020 03:19 PM   CREATININE 0.77 04/01/2019 11:10 AM   GFR 96.72 04/16/2013 09:21 AM   GFRNONAA >60 05/12/2021 05:45 PM   GFRNONAA 93 04/01/2019 11:10 AM   GFRAA 100 12/04/2020 03:19 PM   GFRAA 107 04/01/2019 11:10 AM       Latest Ref Rng & Units 04/04/2022    2:33 PM 05/12/2021    5:45 PM 12/04/2020    3:19 PM  BMP  Glucose 65 - 99 mg/dL 112  124  126   BUN 7 - 25 mg/dL '8  23  9   '$ Creatinine 0.70 - 1.28 mg/dL 0.82  0.78  0.89   BUN/Creat Ratio 6 - 22 (calc)  NOT APPLICABLE   10   Sodium 135 - 146 mmol/L 137  138  136   Potassium 3.5 - 5.3 mmol/L 4.4  3.7  4.8   Chloride 98 - 110 mmol/L 98  102  96   CO2 20 - 32 mmol/L '30  24  25   '$ Calcium 8.6 - 10.3 mg/dL 9.3  9.3  9.3     Current antihypertensive regimen:  Ramipril 10 mg daily Diltiazem 180 mg daily Carvedilol 12.5 mg twice daily  How often are you checking your Blood Pressure? twice daily  Current home BP readings: 140-150's/80's  What recent interventions/DTPs have been made by any provider to improve Blood Pressure control since last CPP Visit: No recent interventions or DTPs.  Any recent hospitalizations or ED visits since last visit with CPP? No  What diet changes have been made to improve Blood Pressure Control?  Patient states his diet is doing well, he claims to have lost 2  lbs.  What exercise is being done to improve your Blood Pressure Control?  "Some but not enough."  Adherence Review: Is the patient currently on ACE/ARB medication? Yes Does the patient have >5 day gap between last estimated fill dates? No  -Patient states he stopped taking Doxazosin. He said this medication made his feet swell up. He stopped taking it and started back on Tamsulosin. The swelling in his feet went away a few days after switching back to Tamsulosin.  Care Gaps: Medicare Annual Wellness: Completed 04/28/2022 Hemoglobin A1C: 5.5% on 09/26/2020 Colonoscopy: Completed 07/23/2013  Future Appointments  Date Time Provider [place]  05/04/2023  3:45 PM [facility]-NURSE HEALTH ADVISOR [facility]   Star Rating Drugs: Atorvastatin 40 mg last filled 03/17/2022 90 DS Ramipril 10 mg last filled 03/31/2022 90 DS  April D Calhoun, [place] Pharmacist Assistant [phone]

## 2022-06-10 ENCOUNTER — Other Ambulatory Visit: Payer: Self-pay | Admitting: Family Medicine

## 2022-06-10 MED ORDER — TAMSULOSIN HCL 0.4 MG PO CAPS: 0.4000 mg | ORAL_CAPSULE | Freq: Every day | ORAL | 3 refills | Status: DC

## 2022-06-13 ENCOUNTER — Other Ambulatory Visit: Payer: Self-pay

## 2022-06-15 MED ORDER — ATORVASTATIN CALCIUM 40 MG PO TABS: ORAL_TABLET | ORAL | 0 refills | Status: DC

## 2022-06-15 NOTE — Telephone Encounter (Signed)
Pt has OV scheduled for 06/20/22. Will send in refill to cover until this appt.

## 2022-06-20 ENCOUNTER — Encounter: Payer: Self-pay | Admitting: Cardiovascular Disease

## 2022-06-20 ENCOUNTER — Ambulatory Visit (INDEPENDENT_AMBULATORY_CARE_PROVIDER_SITE_OTHER): Payer: PPO | Admitting: Cardiovascular Disease

## 2022-06-20 VITALS — BP 125/78 | HR 67 | Ht 69.0 in | Wt 177.4 lb

## 2022-06-20 DIAGNOSIS — E782 Mixed hyperlipidemia: Secondary | ICD-10-CM | POA: Diagnosis not present

## 2022-06-20 DIAGNOSIS — I517 Cardiomegaly: Secondary | ICD-10-CM

## 2022-06-20 MED ORDER — ATORVASTATIN CALCIUM 40 MG PO TABS: ORAL_TABLET | ORAL | 3 refills | Status: DC

## 2022-06-20 NOTE — Patient Instructions (Signed)
Medication Instructions:  ° °Your physician recommends that you continue on your current medications as directed. Please refer to the Current Medication list given to you today. ° °*If you need a refill on your cardiac medications before your next appointment, please call your pharmacy* ° ° ° °Follow-Up: °At [facility], you and your health needs are our priority.  As part of our continuing mission to provide you with exceptional heart care, we have created designated Provider Care Teams.  These Care Teams include your primary Cardiologist (physician) and Advanced Practice Providers (APPs -  Physician Assistants and Nurse Practitioners) who all work together to provide you with the care you need, when you need it. ° °We recommend signing up for the patient portal called "MyChart".  Sign up information is provided on this After Visit Summary.  MyChart is used to connect with patients for Virtual Visits (Telemedicine).  Patients are able to view lab/test results, encounter notes, upcoming appointments, etc.  Non-urgent messages can be sent to your provider as well.   °To learn more about what you can do with MyChart, go to [email or web address].   ° °Your next appointment:   °1 year(s) ° °The format for your next appointment:   °In Person ° °Provider:   °Philip Nahser, MD { ° ° °

## 2022-06-20 NOTE — Progress Notes (Signed)
Walter Cross Date of Birth  1951/05/01 [facility] [address]    [place],   [address] [phone]  Fax  [phone]   71 year old gentleman with a history of a mild LVH. He is done very well on medical therapy. He's been on carvedilol and Cardizem. He does not have any episodes of chest pain or shortness breath. He exercises occasionally.  He's looking forward to getting up and walking work this summer and spring.  Apr 23, 2013:  [name] presents with some episodes of chest pain last year. He had a stress Myoview study which was normal. He's had a recent echocardiogram which did not reveal any LVOT gradient  [name] placed him on an acid blocker and he is feeling well.    Nov. 24, 2014:    he was having some CP the last time I saw him.    A stress Myoview study which revealed no evidence of ischemia. He had normal left ventricular systolic function with an ejection fraction of 81%.  His CP have resolved after being started her on an acid blocker by Dr. [name].  Dec. 1, 2015:  Walter Cross is doing well. No cardiac issues.  No further GERD issues.   Admits that he could use a bit more exercise.  Dec. 1, 2016:  Doing great.   Exercising well.  Has vivid dreams - he thinks its due to the coreg   Dec. 8, 2017:  Doing well.   BP is slightly elevated but typically is normal . Avoids salt .    Not exercising much .    No CP or dyspnea.    Dec. 12, 2018: Doing well .  No CP ,.  Asymptomatic   November 08, 2018: Seen today for follow-up of his  HTN and LVH   He remains asymptomatic. Overall is doing well  Not Exercising as much  No CP or dyspnea  Has some dizziness on occasion   Jan. 7, 2021 Walter Cross is seen today for follow up of his HTN and mild LVH on echo  Has some DOE  Not exercising regularly .  Still working on his 2002 .    Jan. 7, 2022: Walter Cross is seen today for follow up of his HTN and his mild LVH on echo  Had a TIA in  Nov. Had numbness of his right arm,  Then leg numbness. Lasted several minutes Event monitor showed no afib. CT scan was normal Brain MRI  - showed a small left sided lesion  Is now in ASA 325 mg  Instead of 81  LDL was found to be 89.  He was started on atorvastatin.  He wants to stop it  MRA of the neck was normal.  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  June 14, 2021: Walter Cross is seen today for follow up of his HTN and LVH. BP at home is in the normal range.  Not exercising much   Does not want to start meds, would rather exercise   June 20 2022  [name] is seen today for follow-up of his hypertension and LVH. BP has a bit elevated.  He admits that he is not exercising as much as he would like. Wt is 177.  Avoids salty foods.  No CP or dyspnea   Current Outpatient Medications on File Prior to Visit  Medication Sig Dispense Refill   acyclovir ointment (ZOVIRAX) 5 % Apply 1 application topically daily as needed (cold sores &  herpes). 15 g 1   atorvastatin (LIPITOR) 40 MG tablet TAKE 1 TABLET(40 MG) BY MOUTH DAILY 30 tablet 0   carvedilol (COREG) 12.5 MG tablet TAKE 1 TABLET(12.5 MG) BY MOUTH TWICE DAILY WITH A MEAL 180 tablet 1   Cholecalciferol (VITAMIN D) 50 MCG (2000 UT) CAPS Take 1 capsule by mouth daily in the afternoon.     clonazePAM (KLONOPIN) 0.5 MG tablet TAKE 1 TABLET(0.5 MG) BY MOUTH AT BEDTIME AS NEEDED FOR ANXIETY OR SLEEP 30 tablet 0   clopidogrel (PLAVIX) 75 MG tablet Take 1 tablet (75 mg total) by mouth daily. 90 tablet 3   Cyanocobalamin (VITAMIN B-12) 1000 MCG SUBL Place 1 tablet under the tongue daily.      diltiazem (CARTIA XT) 180 MG 24 hr capsule TAKE 1 CAPSULE(180 MG) BY MOUTH DAILY 90 capsule 1   folic acid (FOLVITE) 1 MG tablet TAKE 2 TABLETS(2 MG) BY MOUTH DAILY 180 tablet 0   ibuprofen (ADVIL,MOTRIN) 200 MG tablet Take 200 mg by mouth every 4 (four) hours as needed for headache, mild pain or moderate pain.       nystatin-triamcinolone (MYCOLOG II) cream Apply 1 application topically as needed.     pantoprazole (PROTONIX) 40 MG tablet Take 1 tablet (40 mg total) by mouth daily. 90 tablet 1   predniSONE (DELTASONE) 20 MG tablet 3 tabs poqday 1-2, 2 tabs poqday 3-4, 1 tab poqday 5-6 12 tablet 0   ramipril (ALTACE) 10 MG capsule Take 1 capsule (10 mg total) by mouth daily. PT NEEDS OV AND LABS 90 capsule 0   sildenafil (REVATIO) 20 MG tablet 3-5 tablets daily as needed 50 tablet 6   tamsulosin (FLOMAX) 0.4 MG CAPS capsule Take 1 capsule (0.4 mg total) by mouth daily. 30 capsule 3   venlafaxine XR (EFFEXOR-XR) 150 MG 24 hr capsule TAKE 1 CAPSULE(150 MG) BY MOUTH DAILY WITH BREAKFAST 90 capsule 3   No current facility-administered medications on file prior to visit.    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 Echo 2007: mod LVH, diast dysfn, mild MR, no LVOT obstruction;  b. echo 9/13: upper septal thickening, no SAM of MV, no LVOT gradient, mild LVH, EF 65%   TIA (transient ischemic attack)    Vitamin D deficiency     Past Surgical History:  Procedure Laterality Date   CERVICAL SPINE SURGERY  1992-1993   nerve release, Dr Hal Neer   SPINE SURGERY     L4-L5, [phone]    Social History    Tobacco Use  Smoking Status Former   Packs/day: 1.00   Years: 7.00  Total pack years: 7.00   Types: Cigarettes   Quit date: 11/29/1975   Years since quitting: 46.5  Smokeless Tobacco Never    Social History   Substance and Sexual Activity  Alcohol Use Yes   Alcohol/week: 2.0 standard drinks of alcohol   Types: 2 Standard drinks or equivalent per week    Family History  Problem Relation Age of Onset   Cancer Mother        cervical   Cancer Father        lung   Stroke Brother    Atrial fibrillation Other     Reviw of Systems:  Reviewed in the HPI.  All other systems are negative.   Physical Exam: Blood pressure 125/78, pulse 67, height '5\' 9"'$  (1.753 m), weight 177 lb 6.4 oz (80.5 kg), SpO2 97 %.  GEN:  Well nourished, well developed in no acute distress HEENT: Normal NECK: No JVD; No carotid bruits LYMPHATICS: No lymphadenopathy CARDIAC: RRR , no murmurs, rubs, gallops RESPIRATORY:  Clear to auscultation without rales, wheezing or rhonchi  ABDOMEN: Soft, non-tender, non-distended MUSCULOSKELETAL:  No edema; No deformity  SKIN: Warm and dry NEUROLOGIC:  Alert and oriented x 3   EKG:     Assessment / Plan:   1.  HTN:   : Blood pressure was a little high when he first walked into the office but after several minutes his blood pressure came down to the normal range.  We will continue Altace.  I have highly recommended that he start a regular exercise program.  2.  TIA:    2.   hyperlipidemia: stable total cholesterol is 155.  HDL is 72.  LDL is 62.  Triglyceride levels 125.    [name], MD  06/20/2022 4:41 PM    [facility] [address],  [place], [place]  [address] Pager [phone] Phone: [phone]; Fax: [phone]

## 2022-06-25 ENCOUNTER — Other Ambulatory Visit: Payer: Self-pay | Admitting: Family Medicine

## 2022-06-27 NOTE — Telephone Encounter (Signed)
Requested Prescriptions  Pending Prescriptions Disp Refills  . ramipril (ALTACE) 10 MG capsule [Pharmacy Med Name: RAMIPRIL '10MG'$  CAPSULES] 90 capsule 1    Sig: TAKE 1 CAPSULE(10 MG) BY MOUTH DAILY     Cardiovascular:  ACE Inhibitors Passed - 06/25/2022  4:39 PM      Passed - Cr in normal range and within 180 days    Creat  Date Value Ref Range Status  04/04/2022 0.82 0.70 - 1.28 mg/dL Final         Passed - K in normal range and within 180 days    Potassium  Date Value Ref Range Status  04/04/2022 4.4 3.5 - 5.3 mmol/L Final         Passed - Patient is not pregnant      Passed - Last BP in normal range    BP Readings from Last 1 Encounters:  06/20/22 125/78         Passed - Valid encounter within last 6 months    Recent Outpatient Visits          2 months ago History of TIA (transient ischemic attack)   [place] Susy Frizzle, MD   1 year ago Long term (current) use of anticoagulants   [place] Eulogio Bear, NP   1 year ago History of TIA (transient ischemic attack)   [place] Pickard, Cammie Mcgee, MD   3 years ago General medical exam   [place] Susy Frizzle, MD   4 years ago General medical exam   [place] Pickard, Cammie Mcgee, MD

## 2022-07-25 ENCOUNTER — Other Ambulatory Visit: Payer: Self-pay | Admitting: *Deleted

## 2022-07-25 ENCOUNTER — Telehealth: Payer: Self-pay | Admitting: *Deleted

## 2022-07-25 DIAGNOSIS — I421 Obstructive hypertrophic cardiomyopathy: Secondary | ICD-10-CM

## 2022-07-25 DIAGNOSIS — I10 Essential (primary) hypertension: Secondary | ICD-10-CM

## 2022-07-25 MED ORDER — DILTIAZEM HCL ER COATED BEADS 180 MG PO CP24: ORAL_CAPSULE | ORAL | 3 refills | Status: DC

## 2022-07-25 NOTE — Telephone Encounter (Signed)
Pt was seen in the office today by Ambrose Pancoast, NP who ordered an Itamar study. Pt has been made aware to not open the box until he has been called with the PIN#. Pt agreeable to signed waiver.

## 2022-08-02 ENCOUNTER — Other Ambulatory Visit: Payer: Self-pay

## 2022-08-02 MED ORDER — CARVEDILOL 12.5 MG PO TABS: ORAL_TABLET | ORAL | 3 refills | Status: DC

## 2022-08-08 DIAGNOSIS — K22711 Barrett's esophagus with high grade dysplasia: Secondary | ICD-10-CM | POA: Diagnosis not present

## 2022-08-08 DIAGNOSIS — Z87891 Personal history of nicotine dependence: Secondary | ICD-10-CM | POA: Diagnosis not present

## 2022-08-08 DIAGNOSIS — F32A Depression, unspecified: Secondary | ICD-10-CM | POA: Diagnosis not present

## 2022-08-08 DIAGNOSIS — I10 Essential (primary) hypertension: Secondary | ICD-10-CM | POA: Diagnosis not present

## 2022-08-08 DIAGNOSIS — Z09 Encounter for follow-up examination after completed treatment for conditions other than malignant neoplasm: Secondary | ICD-10-CM | POA: Diagnosis not present

## 2022-08-08 DIAGNOSIS — Z7902 Long term (current) use of antithrombotics/antiplatelets: Secondary | ICD-10-CM | POA: Diagnosis not present

## 2022-08-08 DIAGNOSIS — K2270 Barrett's esophagus without dysplasia: Secondary | ICD-10-CM | POA: Diagnosis not present

## 2022-08-08 DIAGNOSIS — K449 Diaphragmatic hernia without obstruction or gangrene: Secondary | ICD-10-CM | POA: Diagnosis not present

## 2022-08-08 DIAGNOSIS — Z79899 Other long term (current) drug therapy: Secondary | ICD-10-CM | POA: Diagnosis not present

## 2022-08-08 DIAGNOSIS — Z8719 Personal history of other diseases of the digestive system: Secondary | ICD-10-CM | POA: Diagnosis not present

## 2022-08-08 DIAGNOSIS — Z8673 Personal history of transient ischemic attack (TIA), and cerebral infarction without residual deficits: Secondary | ICD-10-CM | POA: Diagnosis not present

## 2022-08-16 NOTE — Addendum Note (Signed)
Addended by: Michae Kava on: 08/16/2022 06:25 PM   Modules accepted: Orders, Level of Service

## 2022-08-16 NOTE — Telephone Encounter (Signed)
This encounter was created in error - please disregard.

## 2022-08-16 NOTE — Telephone Encounter (Signed)
I s/w the opt and confirmed he was not being set up with an Itamar study.   The note was entered on the wrong chart. Pt thanked me for the call.   I will enter as erroneous encounter.

## 2022-08-23 DIAGNOSIS — H43813 Vitreous degeneration, bilateral: Secondary | ICD-10-CM | POA: Diagnosis not present

## 2022-08-23 DIAGNOSIS — H2513 Age-related nuclear cataract, bilateral: Secondary | ICD-10-CM | POA: Diagnosis not present

## 2022-08-23 DIAGNOSIS — H524 Presbyopia: Secondary | ICD-10-CM | POA: Diagnosis not present

## 2022-08-23 DIAGNOSIS — H35033 Hypertensive retinopathy, bilateral: Secondary | ICD-10-CM | POA: Diagnosis not present

## 2022-08-23 DIAGNOSIS — I10 Essential (primary) hypertension: Secondary | ICD-10-CM | POA: Diagnosis not present

## 2022-08-23 DIAGNOSIS — H40053 Ocular hypertension, bilateral: Secondary | ICD-10-CM | POA: Diagnosis not present

## 2022-08-23 DIAGNOSIS — H3509 Other intraretinal microvascular abnormalities: Secondary | ICD-10-CM | POA: Diagnosis not present

## 2022-09-11 ENCOUNTER — Other Ambulatory Visit: Payer: Self-pay | Admitting: Family Medicine

## 2022-09-29 ENCOUNTER — Other Ambulatory Visit: Payer: Self-pay | Admitting: Family Medicine

## 2022-11-01 ENCOUNTER — Other Ambulatory Visit: Payer: Self-pay | Admitting: Family Medicine

## 2022-11-01 DIAGNOSIS — F321 Major depressive disorder, single episode, moderate: Secondary | ICD-10-CM

## 2022-11-03 MED ORDER — CLONAZEPAM 0.5 MG PO TABS: 0.5000 mg | ORAL_TABLET | Freq: Two times a day (BID) | ORAL | 0 refills | Status: DC | PRN

## 2022-11-04 ENCOUNTER — Other Ambulatory Visit: Payer: Self-pay

## 2022-11-04 ENCOUNTER — Encounter: Payer: Self-pay | Admitting: Family Medicine

## 2022-11-04 DIAGNOSIS — M1A9XX0 Chronic gout, unspecified, without tophus (tophi): Secondary | ICD-10-CM

## 2022-11-04 DIAGNOSIS — E872 Acidosis, unspecified: Secondary | ICD-10-CM

## 2022-11-04 DIAGNOSIS — Z0000 Encounter for general adult medical examination without abnormal findings: Secondary | ICD-10-CM

## 2022-11-04 MED ORDER — PREDNISONE 20 MG PO TABS: ORAL_TABLET | ORAL | 3 refills | Status: DC

## 2022-11-07 DIAGNOSIS — Z09 Encounter for follow-up examination after completed treatment for conditions other than malignant neoplasm: Secondary | ICD-10-CM | POA: Diagnosis not present

## 2022-11-07 DIAGNOSIS — F32A Depression, unspecified: Secondary | ICD-10-CM | POA: Diagnosis not present

## 2022-11-07 DIAGNOSIS — Z8719 Personal history of other diseases of the digestive system: Secondary | ICD-10-CM | POA: Diagnosis not present

## 2022-11-07 DIAGNOSIS — K3189 Other diseases of stomach and duodenum: Secondary | ICD-10-CM | POA: Diagnosis not present

## 2022-11-07 DIAGNOSIS — Z8673 Personal history of transient ischemic attack (TIA), and cerebral infarction without residual deficits: Secondary | ICD-10-CM | POA: Diagnosis not present

## 2022-11-07 DIAGNOSIS — K449 Diaphragmatic hernia without obstruction or gangrene: Secondary | ICD-10-CM | POA: Diagnosis not present

## 2022-11-07 DIAGNOSIS — Z888 Allergy status to other drugs, medicaments and biological substances status: Secondary | ICD-10-CM | POA: Diagnosis not present

## 2022-11-07 DIAGNOSIS — I10 Essential (primary) hypertension: Secondary | ICD-10-CM | POA: Diagnosis not present

## 2022-11-07 DIAGNOSIS — Z79899 Other long term (current) drug therapy: Secondary | ICD-10-CM | POA: Diagnosis not present

## 2022-11-07 DIAGNOSIS — K22710 Barrett's esophagus with low grade dysplasia: Secondary | ICD-10-CM | POA: Diagnosis not present

## 2022-11-07 DIAGNOSIS — Z87891 Personal history of nicotine dependence: Secondary | ICD-10-CM | POA: Diagnosis not present

## 2022-11-07 DIAGNOSIS — Z882 Allergy status to sulfonamides status: Secondary | ICD-10-CM | POA: Diagnosis not present

## 2022-11-07 DIAGNOSIS — Z9889 Other specified postprocedural states: Secondary | ICD-10-CM | POA: Diagnosis not present

## 2022-11-11 ENCOUNTER — Telehealth: Payer: Self-pay

## 2022-11-11 NOTE — Telephone Encounter (Signed)
..     Pre-operative Risk Assessment    Patient Name: Walter Cross  DOB: Mar 30, 1951 MRN: [id number]      Request for Surgical Clearance    Procedure:   UPPER ENDOSCOPY  Date of Surgery:  Clearance TBD                                 Surgeon:  DR Jenna Luo Surgeon's Group or Practice Name:  [facility] Phone number:  Tomasita Crumble Fax number:  [phone]   Type of Clearance Requested:   - Medical  - Pharmacy:  Hold Clopidogrel (Plavix)     Type of Anesthesia:  Not Indicated   Additional requests/questions:    Gwenlyn Found   11/11/2022, 12:00 PM

## 2022-11-11 NOTE — Telephone Encounter (Signed)
Call placed to pt regarding surgical clearance and the need for a tele visit.  Per pt, he just had this done 1 week ago and isn't due to have it again for 6 months.  Pt is aware that we will send back to the requesting surgeon's office to make them aware that they can resend clearance when it's a little closer to the procedure date. Also, surgeon's office will be made aware that we do not prescribe pt's Plavix and that will need to come from the prescribing MD.  Pt was thankful for the call.

## 2022-11-11 NOTE — Telephone Encounter (Signed)
Primary Cardiologist:Philip Nahser, MD   Preoperative team, please contact this patient and set up a phone call appointment for further preoperative risk assessment. Please obtain consent and complete medication review. Thank you for your help.   Please notify requesting provider that Plavix is not prescribed by cardiology.    Emmaline Life, NP-C  11/11/2022, 12:41 PM [address], [address] Office [phone] Fax [phone]

## 2022-12-06 ENCOUNTER — Ambulatory Visit (INDEPENDENT_AMBULATORY_CARE_PROVIDER_SITE_OTHER): Payer: PPO | Admitting: Family Medicine

## 2022-12-06 VITALS — BP 148/82 | HR 86 | Ht 69.0 in | Wt 179.0 lb

## 2022-12-06 DIAGNOSIS — R42 Dizziness and giddiness: Secondary | ICD-10-CM | POA: Diagnosis not present

## 2022-12-06 DIAGNOSIS — R5383 Other fatigue: Secondary | ICD-10-CM | POA: Diagnosis not present

## 2022-12-06 NOTE — Progress Notes (Signed)
Subjective:    Patient ID: Walter Cross, male    DOB: 01/26/1951, 72 y.o.   MRN: [id number]  Dizziness  Depression         Patient is a 72 year old gentleman with a history of a TIA who presents today with dizziness.  He states that whenever he stands up, he will feel lightheaded.  He will lose his balance.  He has not fallen.  It lasts for a few seconds after he stands.  It then goes away.  It usually occurs when he standing up from a seated position after sitting.  He denies any syncope.  He denies any irregular heartbeats.  He denies any falls.  He states that this has been going on now for quite some time maybe even a year but it has steadily gotten worse.  Of note he is on carvedilol and diltiazem as well as tamsulosin.  He also reports fatigue.  He states that he feels listless with no energy.  He felt like Trintellix was doing better to manage his depression with venlafaxine and he is interested in switching to better manage his depression with the hope that he would have more energy and drive.  However he denies feeling overly depressed.  He denies suicidal thoughts.  He does report some mild short-term memory problems with.  He states he will frequently repeat questions.  He will forget things people have told him.  He denies forgetting to pay bills.  He denies losing money.  He denies getting lost while driving. Past Surgical History:  Procedure Laterality Date   CERVICAL SPINE SURGERY  [phone]   nerve release, Dr Hal Neer   SPINE SURGERY     L4-L5, [phone]   Current Outpatient Medications on File Prior to Visit  Medication Sig Dispense Refill   acyclovir ointment (ZOVIRAX) 5 % Apply 1 application topically daily as needed (cold sores & herpes). 15 g 1   atorvastatin (LIPITOR) 40 MG tablet TAKE 1 TABLET(40 MG) BY MOUTH DAILY 90 tablet 3   carvedilol (COREG) 12.5 MG tablet TAKE 1 TABLET(12.5 MG) BY MOUTH TWICE DAILY WITH A MEAL 180 tablet 3   Cholecalciferol (VITAMIN D) 50  MCG (2000 UT) CAPS Take 1 capsule by mouth daily in the afternoon.     clonazePAM (KLONOPIN) 0.5 MG tablet Take 1 tablet (0.5 mg total) by mouth 2 (two) times daily as needed for anxiety. 30 tablet 0   clopidogrel (PLAVIX) 75 MG tablet Take 1 tablet (75 mg total) by mouth daily. 90 tablet 3   Cyanocobalamin (VITAMIN B-12) 1000 MCG SUBL Place 1 tablet under the tongue daily.      diltiazem (CARTIA XT) 180 MG 24 hr capsule TAKE 1 CAPSULE(180 MG) BY MOUTH DAILY 90 capsule 3   folic acid (FOLVITE) 1 MG tablet TAKE 2 TABLETS(2 MG) BY MOUTH DAILY 180 tablet 0   ibuprofen (ADVIL,MOTRIN) 200 MG tablet Take 200 mg by mouth every 4 (four) hours as needed for headache, mild pain or moderate pain.      nystatin-triamcinolone (MYCOLOG II) cream Apply 1 application topically as needed.     pantoprazole (PROTONIX) 40 MG tablet Take 1 tablet (40 mg total) by mouth daily. 90 tablet 1   predniSONE (DELTASONE) 20 MG tablet 3 tabs poqday 1-2, 2 tabs poqday 3-4, 1 tab poqday 5-6 12 tablet 3   ramipril (ALTACE) 10 MG capsule TAKE 1 CAPSULE(10 MG) BY MOUTH DAILY 30 capsule 0   sildenafil (REVATIO) 20 MG tablet 3-5 tablets  daily as needed 50 tablet 6   tamsulosin (FLOMAX) 0.4 MG CAPS capsule TAKE 1 CAPSULE(0.4 MG) BY MOUTH DAILY 30 capsule 3   venlafaxine XR (EFFEXOR-XR) 150 MG 24 hr capsule TAKE 1 CAPSULE(150 MG) BY MOUTH DAILY WITH BREAKFAST 90 capsule 3   No current facility-administered medications on file prior to visit.   Allergies  Allergen Reactions   Sulfa Antibiotics     Pt reports causes aches all over   Esomeprazole Magnesium Other (See Comments)    cramps   Septra [Bactrim] Other (See Comments)    Back ache   Sulfamethoxazole-Trimethoprim     Other reaction(s): Other (See Comments) Back ache Back ache Back ache    Social History   Socioeconomic History   Marital status: Widowed    Spouse name: Not on file   Number of children: Not on file   Years of education: Not on file   Highest  education level: Not on file  Occupational History   Occupation: Patent examiner: [employer]  Tobacco Use   Smoking status: Former    Packs/day: 1.00    Years: 7.00    Total pack years: 7.00    Types: Cigarettes    Quit date: 11/29/1975    Years since quitting: 47.0   Smokeless tobacco: Never  Vaping Use   Vaping Use: Never used  Substance and Sexual Activity   Alcohol use: Yes    Alcohol/week: 2.0 standard drinks of alcohol    Types: 2 Standard drinks or equivalent per week   Drug use: No   Sexual activity: Not Currently    Birth control/protection: None  Other Topics Concern   Not on file  Social History Narrative   Not on file   Social Determinants of Health   Financial Resource Strain: Low Risk  (04/28/2022)   Overall Financial Resource Strain (CARDIA)    Difficulty of Paying Living Expenses: Not hard at all  Food Insecurity: No Food Insecurity (04/28/2022)   Hunger Vital Sign    Worried About Running Out of Food in the Last Year: Never true    [place] in the Last Year: Never true  Transportation Needs: Unknown (04/28/2022)   PRAPARE - Hydrologist (Medical): No    Lack of Transportation (Non-Medical): Not on file  Physical Activity: Insufficiently Active (04/28/2022)   Exercise Vital Sign    Days of Exercise per Week: 3 days    Minutes of Exercise per Session: 30 min  Stress: No Stress Concern Present (04/28/2022)   [place]    Feeling of Stress : Not at all  Social Connections: Socially Isolated (04/28/2022)   Social Connection and Isolation Panel [NHANES]    Frequency of Communication with Friends and Family: More than three times a week    Frequency of Social Gatherings with Friends and Family: Twice a week    Attends Religious Services: Never    Marine scientist or Organizations: No    Attends Archivist Meetings:  Never    Marital Status: Widowed  Intimate Partner Violence: Not At Risk (04/28/2022)   Humiliation, Afraid, Rape, and Kick questionnaire    Fear of Current or Ex-Partner: No    Emotionally Abused: No    Physically Abused: No    Sexually Abused: No   Family History  Problem Relation Age of Onset   Cancer Mother  cervical   Cancer Father        lung   Stroke Brother    Atrial fibrillation Other      Review of Systems  Neurological:  Positive for dizziness.  Psychiatric/Behavioral:  Positive for depression.   All other systems reviewed and are negative.      Objective:   Physical Exam Vitals reviewed.  Constitutional:      General: He is not in acute distress.    Appearance: He is well-developed. He is not diaphoretic.  HENT:     Head: Normocephalic and atraumatic.     Right Ear: External ear normal.     Left Ear: External ear normal.     Nose: Nose normal.     Mouth/Throat:     Pharynx: No oropharyngeal exudate.  Eyes:     General: No scleral icterus.       Right eye: No discharge.        Left eye: No discharge.     Conjunctiva/sclera: Conjunctivae normal.     Pupils: Pupils are equal, round, and reactive to light.  Neck:     Thyroid: No thyromegaly.     Vascular: No JVD.     Trachea: No tracheal deviation.  Cardiovascular:     Rate and Rhythm: Normal rate and regular rhythm.     Heart sounds: Normal heart sounds. No murmur heard.    No friction rub. No gallop.  Pulmonary:     Effort: Pulmonary effort is normal. No respiratory distress.     Breath sounds: Normal breath sounds. No stridor. No wheezing or rales.  Chest:     Chest wall: No tenderness.  Abdominal:     General: Bowel sounds are normal. There is no distension.     Palpations: Abdomen is soft. There is no mass.     Tenderness: There is no abdominal tenderness. There is no guarding or rebound.  Genitourinary:    Prostate: Normal.     Rectum: Normal.  Musculoskeletal:        General: No  tenderness. Normal range of motion.     Cervical back: Normal range of motion and neck supple.  Lymphadenopathy:     Cervical: No cervical adenopathy.  Skin:    General: Skin is warm.     Coloration: Skin is not pale.     Findings: No erythema or rash.  Neurological:     Mental Status: He is alert and oriented to person, place, and time.     Cranial Nerves: No cranial nerve deficit.     Motor: No abnormal muscle tone.     Coordination: Coordination normal.     Deep Tendon Reflexes: Reflexes are normal and symmetric.  Psychiatric:        Behavior: Behavior normal.        Thought Content: Thought content normal.        Judgment: Judgment normal.   Patient has a wide-based gait.  He shuffles while he walks.        Assessment & Plan:  Fatigue, unspecified type - Plan: CBC with Differential/Platelet, COMPLETE METABOLIC PANEL WITH GFR, TSH, Vitamin B12, Testosterone Total,Free,Bio, Males  Orthostatic dizziness I am concerned that the patient's orthostatic dizziness is likely a drop in his blood pressure with standing.  This is likely made worse due to his carvedilol and diltiazem as well as tamsulosin.  I will have the patient hold tamsulosin to see if this improves.  I also encouraged him to drink more water  and to limit his alcohol to less than 2 beers a day.  Recheck in 1 week.  If symptoms or not improving at that point I would recommend imaging of the brain given his previous history of stroke to evaluate for any cerebellar lesion as the patient does have an abnormal wide-based shuffling gait.  Given his fatigue I will check a CBC a CMP TSH and B12 and testosterone level.  If lab work is normal, I will consider switching the patient's venlafaxine to Trintellix as his fatigue may be related to depression.

## 2022-12-07 LAB — TESTOSTERONE TOTAL,FREE,BIO, MALES
Albumin: 4.2 g/dL (ref 3.6–5.1)
Sex Hormone Binding: 30 nmol/L (ref 22–77)
Testosterone, Bioavailable: 95.1 ng/dL (ref 15.0–150.0)
Testosterone, Free: 49.4 pg/mL (ref 6.0–73.0)
Testosterone: 348 ng/dL (ref 250–827)

## 2022-12-07 LAB — CBC WITH DIFFERENTIAL/PLATELET
Absolute Monocytes: 657 cells/uL (ref 200–950)
Basophils Absolute: 31 cells/uL (ref 0–200)
Basophils Relative: 0.5 %
Eosinophils Absolute: 81 cells/uL (ref 15–500)
Eosinophils Relative: 1.3 %
HCT: 43.5 % (ref 38.5–50.0)
Hemoglobin: 15.4 g/dL (ref 13.2–17.1)
Lymphs Abs: 812 cells/uL — ABNORMAL LOW (ref 850–3900)
MCH: 34.3 pg — ABNORMAL HIGH (ref 27.0–33.0)
MCHC: 35.4 g/dL (ref 32.0–36.0)
MCV: 96.9 fL (ref 80.0–100.0)
MPV: 10 fL (ref 7.5–12.5)
Monocytes Relative: 10.6 %
Neutro Abs: 4619 cells/uL (ref 1500–7800)
Neutrophils Relative %: 74.5 %
Platelets: 226 10*3/uL (ref 140–400)
RBC: 4.49 10*6/uL (ref 4.20–5.80)
RDW: 13.3 % (ref 11.0–15.0)
Total Lymphocyte: 13.1 %
WBC: 6.2 10*3/uL (ref 3.8–10.8)

## 2022-12-07 LAB — COMPLETE METABOLIC PANEL WITH GFR
AG Ratio: 1.7 (calc) (ref 1.0–2.5)
ALT: 19 U/L (ref 9–46)
AST: 21 U/L (ref 10–35)
Albumin: 4.2 g/dL (ref 3.6–5.1)
Alkaline phosphatase (APISO): 91 U/L (ref 35–144)
BUN: 8 mg/dL (ref 7–25)
CO2: 26 mmol/L (ref 20–32)
Calcium: 9.2 mg/dL (ref 8.6–10.3)
Chloride: 101 mmol/L (ref 98–110)
Creat: 0.72 mg/dL (ref 0.70–1.28)
Globulin: 2.5 g/dL (calc) (ref 1.9–3.7)
Glucose, Bld: 110 mg/dL — ABNORMAL HIGH (ref 65–99)
Potassium: 4.4 mmol/L (ref 3.5–5.3)
Sodium: 140 mmol/L (ref 135–146)
Total Bilirubin: 0.6 mg/dL (ref 0.2–1.2)
Total Protein: 6.7 g/dL (ref 6.1–8.1)
eGFR: 97 mL/min/{1.73_m2} (ref >=60)

## 2022-12-07 LAB — TSH: TSH: 1.94 mIU/L (ref 0.40–4.50)

## 2022-12-07 LAB — VITAMIN B12: Vitamin B-12: 183 pg/mL — ABNORMAL LOW (ref 200–1100)

## 2022-12-08 ENCOUNTER — Telehealth: Payer: Self-pay | Admitting: Family Medicine

## 2022-12-08 NOTE — Telephone Encounter (Signed)
Received call from Maudie Mercury, Nurse at [facility] at [facility]. Fax received but transmission was incomplete. New fax sent today; requesting for you to have it completed and to resend it to fax # [phone].  Please advise Maudie Mercury with any questions at [phone].

## 2022-12-12 ENCOUNTER — Ambulatory Visit (INDEPENDENT_AMBULATORY_CARE_PROVIDER_SITE_OTHER): Payer: PPO

## 2022-12-12 DIAGNOSIS — E538 Deficiency of other specified B group vitamins: Secondary | ICD-10-CM

## 2022-12-12 DIAGNOSIS — R5383 Other fatigue: Secondary | ICD-10-CM

## 2022-12-12 MED ORDER — CYANOCOBALAMIN 1000 MCG/ML IJ SOLN
1000.0000 ug | Freq: Once | INTRAMUSCULAR | Status: AC
  Administered 2022-12-12: 1000 ug via INTRAMUSCULAR

## 2022-12-24 ENCOUNTER — Other Ambulatory Visit: Payer: Self-pay | Admitting: Family Medicine

## 2022-12-26 NOTE — Telephone Encounter (Signed)
Requested Prescriptions  Pending Prescriptions Disp Refills   clopidogrel (PLAVIX) 75 MG tablet [Pharmacy Med Name: CLOPIDOGREL '75MG'$  TABLETS] 90 tablet 0    Sig: TAKE 1 TABLET(75 MG) BY MOUTH DAILY     Hematology: Antiplatelets - clopidogrel Failed - 12/24/2022  8:32 AM      Failed - Valid encounter within last 6 months    Recent Outpatient Visits           8 months ago History of TIA (transient ischemic attack)   [place] Pickard, Cammie Mcgee, MD   1 year ago Long term (current) use of anticoagulants   [place] Eulogio Bear, NP   1 year ago History of TIA (transient ischemic attack)   [place] Susy Frizzle, MD   3 years ago General medical exam   [place] Susy Frizzle, MD   4 years ago General medical exam   [place] Pickard, Cammie Mcgee, MD              Passed - HCT in normal range and within 180 days    HCT  Date Value Ref Range Status  12/06/2022 43.5 38.5 - 50.0 % Final         Passed - HGB in normal range and within 180 days    Hemoglobin  Date Value Ref Range Status  12/06/2022 15.4 13.2 - 17.1 g/dL Final         Passed - PLT in normal range and within 180 days    Platelets  Date Value Ref Range Status  12/06/2022 226 140 - 400 Thousand/uL Final         Passed - Cr in normal range and within 360 days    Creat  Date Value Ref Range Status  12/06/2022 0.72 0.70 - 1.28 mg/dL Final

## 2022-12-31 ENCOUNTER — Other Ambulatory Visit: Payer: Self-pay | Admitting: Family Medicine

## 2023-01-05 ENCOUNTER — Telehealth: Payer: Self-pay | Admitting: Pharmacist

## 2023-01-05 NOTE — Progress Notes (Addendum)
Care Management & Coordination Services Pharmacy Team  Reason for Encounter: Hypertension  Contacted patient to discuss hypertension disease state. Spoke with patient on 01/05/2023     Current antihypertensive regimen:  Ramipril 10 mg daily Carvedilol 12.5 mg twice daily Diltiazem 180 mg daily  Patient verbally confirms he is taking the above medications as directed. Yes  How often are you checking your Blood Pressure? 1-2x per week  he checks his blood pressure in the evening after taking his medication.  Current home BP readings:  120/66, 117/68, 125/71, 129/70   Wrist or arm cuff: arm Caffeine intake: one cup of decaff in the morning Salt intake: patient states he avoids as much as he can OTC medications including pseudoephedrine or NSAIDs?  Any readings above 180/100? No If yes any symptoms of hypertensive emergency? patient denies any symptoms of high blood pressure  What recent interventions/DTPs have been made by any provider to improve Blood Pressure control since last CPP Visit: patient discontinued Tamsulosin in hopes it would help improve dizziness.  Any recent hospitalizations or ED visits since last visit with CPP? No  What diet changes have been made to improve Blood Pressure Control?  Patient states he eats well and he's trying to keep his weight steady.  What exercise is being done to improve your Blood Pressure Control?  "Some but not much".  Discontinuing Tamsulosin didn't help dizziness. It only made it "harder to pee". Can he start back taking it?  Has taken half dose of Venlafaxine for a few weeks and has noticed a minor improvement in his balance.  Trintilix has helped his mood although it's expensive. He would like to continue taking this medication.  Adherence Review: Is the patient currently on ACE/ARB medication? Yes Does the patient have >5 day gap between last estimated fill dates? No  Star Rating Drugs:  Atorvastatin 40 mg last filled  10/22/2022 90 DS Ramipril 10 mg last filled 12/30/2022 30 DS   Chart Updates: Recent office visits:  12/06/2022 OV (PCP) Susy Frizzle, MD; I will have the patient hold tamsulosin to see if this improves. If lab work is normal, I will consider switching the patient's venlafaxine to Trintellix as his fatigue may be related to depression    Recent consult visits:  None  Hospital visits:  None in previous 6 months  Medications: Outpatient Encounter Medications as of 01/05/2023  Medication Sig   acyclovir ointment (ZOVIRAX) 5 % Apply 1 application topically daily as needed (cold sores & herpes).   atorvastatin (LIPITOR) 40 MG tablet TAKE 1 TABLET(40 MG) BY MOUTH DAILY   carvedilol (COREG) 12.5 MG tablet TAKE 1 TABLET(12.5 MG) BY MOUTH TWICE DAILY WITH A MEAL   Cholecalciferol (VITAMIN D) 50 MCG (2000 UT) CAPS Take 1 capsule by mouth daily in the afternoon.   clonazePAM (KLONOPIN) 0.5 MG tablet Take 1 tablet (0.5 mg total) by mouth 2 (two) times daily as needed for anxiety.   clopidogrel (PLAVIX) 75 MG tablet TAKE 1 TABLET(75 MG) BY MOUTH DAILY   diltiazem (CARTIA XT) 180 MG 24 hr capsule TAKE 1 CAPSULE(180 MG) BY MOUTH DAILY   folic acid (FOLVITE) 1 MG tablet TAKE 2 TABLETS(2 MG) BY MOUTH DAILY   ibuprofen (ADVIL,MOTRIN) 200 MG tablet Take 200 mg by mouth every 4 (four) hours as needed for headache, mild pain or moderate pain.    nystatin-triamcinolone (MYCOLOG II) cream Apply 1 application topically as needed.   pantoprazole (PROTONIX) 40 MG tablet Take 1 tablet (40 mg  total) by mouth daily.   predniSONE (DELTASONE) 20 MG tablet 3 tabs poqday 1-2, 2 tabs poqday 3-4, 1 tab poqday 5-6   ramipril (ALTACE) 10 MG capsule TAKE 1 CAPSULE(10 MG) BY MOUTH DAILY   sildenafil (REVATIO) 20 MG tablet 3-5 tablets daily as needed   tamsulosin (FLOMAX) 0.4 MG CAPS capsule TAKE 1 CAPSULE(0.4 MG) BY MOUTH DAILY   venlafaxine XR (EFFEXOR-XR) 150 MG 24 hr capsule TAKE 1 CAPSULE(150 MG) BY MOUTH DAILY WITH  BREAKFAST   No facility-administered encounter medications on file as of 01/05/2023.    Recent Office Vitals: BP Readings from Last 3 Encounters:  12/06/22 (!) 148/82  06/20/22 125/78  04/04/22 (!) 148/91   Pulse Readings from Last 3 Encounters:  12/06/22 86  06/20/22 67  04/04/22 85    Wt Readings from Last 3 Encounters:  12/06/22 179 lb (81.2 kg)  06/20/22 177 lb 6.4 oz (80.5 kg)  04/28/22 174 lb (78.9 kg)     Kidney Function Lab Results  Component Value Date/Time   CREATININE 0.72 12/06/2022 11:41 AM   CREATININE 0.82 04/04/2022 02:33 PM   GFR 96.72 04/16/2013 09:21 AM   GFRNONAA >60 05/12/2021 05:45 PM   GFRNONAA 93 04/01/2019 11:10 AM   GFRAA 100 12/04/2020 03:19 PM   GFRAA 107 04/01/2019 11:10 AM       Latest Ref Rng & Units 12/06/2022   11:41 AM 04/04/2022    2:33 PM 05/12/2021    5:45 PM  BMP  Glucose 65 - 99 mg/dL 110  112  124   BUN 7 - 25 mg/dL 8  8  23   $ Creatinine 0.70 - 1.28 mg/dL 0.72  0.82  0.78   BUN/Creat Ratio 6 - 22 (calc) SEE NOTE:  NOT APPLICABLE    Sodium A999333 - 146 mmol/L 140  137  138   Potassium 3.5 - 5.3 mmol/L 4.4  4.4  3.7   Chloride 98 - 110 mmol/L 101  98  102   CO2 20 - 32 mmol/L 26  30  24   $ Calcium 8.6 - 10.3 mg/dL 9.2  9.3  9.3     Future Appointments  Date Time Provider [place]  04/11/2023  3:30 PM Edythe Clarity, [place] None  05/04/2023  3:45 PM [facility]-NURSE HEALTH ADVISOR [facility]    April D Calhoun, [place] Pharmacist Assistant [phone]

## 2023-01-18 ENCOUNTER — Encounter: Payer: Self-pay | Admitting: Family Medicine

## 2023-01-19 ENCOUNTER — Other Ambulatory Visit: Payer: Self-pay | Admitting: Family Medicine

## 2023-01-19 MED ORDER — TAMSULOSIN HCL 0.4 MG PO CAPS: ORAL_CAPSULE | ORAL | 3 refills | Status: DC

## 2023-01-19 MED ORDER — VORTIOXETINE HBR 10 MG PO TABS: 10.0000 mg | ORAL_TABLET | Freq: Every day | ORAL | 3 refills | Status: DC

## 2023-01-19 MED ORDER — RAMIPRIL 10 MG PO CAPS: ORAL_CAPSULE | ORAL | 3 refills | Status: DC

## 2023-02-02 ENCOUNTER — Encounter: Payer: Self-pay | Admitting: Family Medicine

## 2023-02-02 ENCOUNTER — Telehealth: Payer: Self-pay

## 2023-02-02 ENCOUNTER — Ambulatory Visit (INDEPENDENT_AMBULATORY_CARE_PROVIDER_SITE_OTHER): Payer: PPO | Admitting: Family Medicine

## 2023-02-02 VITALS — BP 146/82 | HR 73 | Temp 98.5°F | Wt 181.2 lb

## 2023-02-02 DIAGNOSIS — Z8673 Personal history of transient ischemic attack (TIA), and cerebral infarction without residual deficits: Secondary | ICD-10-CM | POA: Diagnosis not present

## 2023-02-02 DIAGNOSIS — R110 Nausea: Secondary | ICD-10-CM | POA: Diagnosis not present

## 2023-02-02 DIAGNOSIS — R413 Other amnesia: Secondary | ICD-10-CM

## 2023-02-02 DIAGNOSIS — R270 Ataxia, unspecified: Secondary | ICD-10-CM

## 2023-02-02 MED ORDER — ONDANSETRON HCL 4 MG PO TABS: 4.0000 mg | ORAL_TABLET | Freq: Three times a day (TID) | ORAL | 0 refills | Status: DC | PRN

## 2023-02-02 NOTE — Telephone Encounter (Signed)
PA SENT TO [employer]:  Your PA has been faxed to the plan as a paper copy. Please contact the plan directly if you haven't received a determination in a typical timeframe.  You will be notified of the determination via fax. How do I know if the plan approved the PA?  Add Reminder to your Dashboard Remind me in:  5 business days Contact plan to follow up on [facility]

## 2023-02-02 NOTE — Progress Notes (Signed)
Subjective:    Patient ID: Walter Cross, male    DOB: 1951/05/29, 72 y.o.   MRN: [id number] Patient states that his dizziness has gotten better since he stopped Effexor and switch back to Trintellix.   However, he reports 3 to 4 days of nausea, dry heaves, and diarrhea.  He states that he had a dull headache in his frontal sinus area and bodyaches that preceded this.  He denies any recent travel.  He denies any abdominal pain.  He denies any blood in stool.  He denies any cough or shortness of breath.  He denies any melena or hematochezia.  Diarrhea is loose watery stool. Past Surgical History:  Procedure Laterality Date   CERVICAL SPINE SURGERY  [phone]   nerve release, Dr Hal Neer   SPINE SURGERY     L4-L5, [phone]   Current Outpatient Medications on File Prior to Visit  Medication Sig Dispense Refill   acyclovir ointment (ZOVIRAX) 5 % Apply 1 application topically daily as needed (cold sores & herpes). 15 g 1   atorvastatin (LIPITOR) 40 MG tablet TAKE 1 TABLET(40 MG) BY MOUTH DAILY 90 tablet 3   carvedilol (COREG) 12.5 MG tablet TAKE 1 TABLET(12.5 MG) BY MOUTH TWICE DAILY WITH A MEAL 180 tablet 3   Cholecalciferol (VITAMIN D) 50 MCG (2000 UT) CAPS Take 1 capsule by mouth daily in the afternoon.     clonazePAM (KLONOPIN) 0.5 MG tablet Take 1 tablet (0.5 mg total) by mouth 2 (two) times daily as needed for anxiety. 30 tablet 0   clopidogrel (PLAVIX) 75 MG tablet TAKE 1 TABLET(75 MG) BY MOUTH DAILY 90 tablet 0   diltiazem (CARTIA XT) 180 MG 24 hr capsule TAKE 1 CAPSULE(180 MG) BY MOUTH DAILY 90 capsule 3   folic acid (FOLVITE) 1 MG tablet TAKE 2 TABLETS(2 MG) BY MOUTH DAILY 180 tablet 0   nystatin-triamcinolone (MYCOLOG II) cream Apply 1 application topically as needed.     pantoprazole (PROTONIX) 40 MG tablet Take 1 tablet (40 mg total) by mouth daily. 90 tablet 1   predniSONE (DELTASONE) 20 MG tablet 3 tabs poqday 1-2, 2 tabs poqday 3-4, 1 tab poqday 5-6 12 tablet 3   ramipril  (ALTACE) 10 MG capsule TAKE 1 CAPSULE(10 MG) BY MOUTH DAILY 90 capsule 3   sildenafil (REVATIO) 20 MG tablet 3-5 tablets daily as needed 50 tablet 6   tamsulosin (FLOMAX) 0.4 MG CAPS capsule TAKE 1 CAPSULE(0.4 MG) BY MOUTH DAILY 90 capsule 3   vortioxetine HBr (TRINTELLIX) 10 MG TABS tablet Take 1 tablet (10 mg total) by mouth daily. 90 tablet 3   No current facility-administered medications on file prior to visit.   Allergies  Allergen Reactions   Sulfa Antibiotics     Pt reports causes aches all over   Esomeprazole Magnesium Other (See Comments)    cramps   Septra [Bactrim] Other (See Comments)    Back ache   Sulfamethoxazole-Trimethoprim     Other reaction(s): Other (See Comments) Back ache Back ache Back ache    Social History   Socioeconomic History   Marital status: Widowed    Spouse name: Not on file   Number of children: Not on file   Years of education: Not on file   Highest education level: Not on file  Occupational History   Occupation: Radio producer    Employer: [employer]  Tobacco Use   Smoking status: Former    Packs/day: 1.00    Years: 7.00  Total pack years: 7.00    Types: Cigarettes    Quit date: 11/29/1975    Years since quitting: 47.2   Smokeless tobacco: Never  Vaping Use   Vaping Use: Never used  Substance and Sexual Activity   Alcohol use: Yes    Alcohol/week: 2.0 standard drinks of alcohol    Types: 2 Standard drinks or equivalent per week   Drug use: No   Sexual activity: Not Currently    Birth control/protection: None  Other Topics Concern   Not on file  Social History Narrative   Not on file   Social Determinants of Health   Financial Resource Strain: Low Risk  (04/28/2022)   Overall Financial Resource Strain (CARDIA)    Difficulty of Paying Living Expenses: Not hard at all  Food Insecurity: No Food Insecurity (04/28/2022)   Hunger Vital Sign    Worried About Running Out of Food in the Last Year: Never true     [place] in the Last Year: Never true  Transportation Needs: Unknown (04/28/2022)   PRAPARE - Hydrologist (Medical): No    Lack of Transportation (Non-Medical): Not on file  Physical Activity: Insufficiently Active (04/28/2022)   Exercise Vital Sign    Days of Exercise per Week: 3 days    Minutes of Exercise per Session: 30 min  Stress: No Stress Concern Present (04/28/2022)   [place]    Feeling of Stress : Not at all  Social Connections: Socially Isolated (04/28/2022)   Social Connection and Isolation Panel [NHANES]    Frequency of Communication with Friends and Family: More than three times a week    Frequency of Social Gatherings with Friends and Family: Twice a week    Attends Religious Services: Never    Marine scientist or Organizations: No    Attends Archivist Meetings: Never    Marital Status: Widowed  Intimate Partner Violence: Not At Risk (04/28/2022)   Humiliation, Afraid, Rape, and Kick questionnaire    Fear of Current or Ex-Partner: No    Emotionally Abused: No    Physically Abused: No    Sexually Abused: No   Family History  Problem Relation Age of Onset   Cancer Mother        cervical   Cancer Father        lung   Stroke Brother    Atrial fibrillation Other      Review of Systems  Neurological:  Positive for dizziness.  All other systems reviewed and are negative.      Objective:   Physical Exam Vitals reviewed.  Constitutional:      General: He is not in acute distress.    Appearance: He is well-developed. He is not diaphoretic.  HENT:     Head: Normocephalic and atraumatic.     Right Ear: External ear normal.     Left Ear: External ear normal.     Nose: Nose normal.     Mouth/Throat:     Pharynx: No oropharyngeal exudate.  Eyes:     General: No scleral icterus.       Right eye: No discharge.        Left eye: No discharge.      Conjunctiva/sclera: Conjunctivae normal.     Pupils: Pupils are equal, round, and reactive to light.  Neck:     Thyroid: No thyromegaly.     Vascular: No  JVD.     Trachea: No tracheal deviation.  Cardiovascular:     Rate and Rhythm: Normal rate and regular rhythm.     Heart sounds: Normal heart sounds. No murmur heard.    No friction rub. No gallop.  Pulmonary:     Effort: Pulmonary effort is normal. No respiratory distress.     Breath sounds: Normal breath sounds. No stridor. No wheezing or rales.  Chest:     Chest wall: No tenderness.  Abdominal:     General: Bowel sounds are normal. There is no distension.     Palpations: Abdomen is soft. There is no mass.     Tenderness: There is no abdominal tenderness. There is no guarding or rebound.  Genitourinary:    Prostate: Normal.     Rectum: Normal.  Musculoskeletal:        General: No tenderness. Normal range of motion.     Cervical back: Normal range of motion and neck supple.  Lymphadenopathy:     Cervical: No cervical adenopathy.  Skin:    General: Skin is warm.     Coloration: Skin is not pale.     Findings: No erythema or rash.  Neurological:     Mental Status: He is alert and oriented to person, place, and time.     Cranial Nerves: No cranial nerve deficit.     Motor: No abnormal muscle tone.     Coordination: Coordination normal.     Deep Tendon Reflexes: Reflexes are normal and symmetric.  Psychiatric:        Behavior: Behavior normal.        Thought Content: Thought content normal.        Judgment: Judgment normal.         Assessment & Plan:  Nausea  Ataxia - Plan: MR Brain W Wo Contrast  History of TIA (transient ischemic attack) - Plan: MR Brain W Wo Contrast  Memory loss - Plan: MR Brain W Wo Contrast  Patient insist that the dizziness is better.  Nausea could be related to viral gastroenteritis.  Recommended trying Zofran 4 mg every 8 hours as needed and tincture of time.  Symptoms should gradually  improve over the next 3 to 4 days.  However, the patient almost fell getting off the exam table.  He staggered and had to grab the wall.  Given his recent issues with balance, his wide-based shuffling gait, his daily headaches, and his recent report of memory loss, I have recommended an MRI of the brain.  Please see the history of present illness and his previous office visit dated 12/06/22

## 2023-02-02 NOTE — Telephone Encounter (Signed)
Pt called in stating that he needed a PA for this med ondansetron (ZOFRAN) 4 MG tablet [id number]. Please advise.  Cb#: [phone]

## 2023-02-03 ENCOUNTER — Other Ambulatory Visit: Payer: Self-pay | Admitting: Family Medicine

## 2023-02-03 ENCOUNTER — Telehealth: Payer: Self-pay | Admitting: Family Medicine

## 2023-02-03 MED ORDER — PROMETHAZINE HCL 25 MG PO TABS: 25.0000 mg | ORAL_TABLET | Freq: Three times a day (TID) | ORAL | 0 refills | Status: DC | PRN

## 2023-02-03 NOTE — Telephone Encounter (Signed)
Patient called to follow up on Rx received yesterday for nausea; stated his insurance won't pay for it since the medication is generally used for something other than nausea. Pharmacy requesting an alternative medication.  Pharmacy confirmed as:  [facility] [place], [place] - [address] AT [place] [facility] [address] [place] [address] Phone: [phone]  Fax: [phone] DEA #: [id number]   Please advise at [phone].

## 2023-02-13 DIAGNOSIS — K22710 Barrett's esophagus with low grade dysplasia: Secondary | ICD-10-CM | POA: Diagnosis not present

## 2023-02-13 DIAGNOSIS — K449 Diaphragmatic hernia without obstruction or gangrene: Secondary | ICD-10-CM | POA: Diagnosis not present

## 2023-02-13 DIAGNOSIS — Z79899 Other long term (current) drug therapy: Secondary | ICD-10-CM | POA: Diagnosis not present

## 2023-02-13 DIAGNOSIS — Z8719 Personal history of other diseases of the digestive system: Secondary | ICD-10-CM | POA: Diagnosis not present

## 2023-02-13 DIAGNOSIS — Z09 Encounter for follow-up examination after completed treatment for conditions other than malignant neoplasm: Secondary | ICD-10-CM | POA: Diagnosis not present

## 2023-02-13 DIAGNOSIS — Z7902 Long term (current) use of antithrombotics/antiplatelets: Secondary | ICD-10-CM | POA: Diagnosis not present

## 2023-02-13 DIAGNOSIS — I10 Essential (primary) hypertension: Secondary | ICD-10-CM | POA: Diagnosis not present

## 2023-02-13 DIAGNOSIS — K219 Gastro-esophageal reflux disease without esophagitis: Secondary | ICD-10-CM | POA: Diagnosis not present

## 2023-02-27 ENCOUNTER — Ambulatory Visit
Admission: RE | Admit: 2023-02-27 | Discharge: 2023-02-27 | Disposition: A | Discharge status: Home / Self Care | Payer: PPO | Source: Ambulatory Visit | Attending: Family Medicine | Admitting: Family Medicine

## 2023-02-27 DIAGNOSIS — R270 Ataxia, unspecified: Secondary | ICD-10-CM

## 2023-02-27 DIAGNOSIS — R413 Other amnesia: Secondary | ICD-10-CM

## 2023-02-27 DIAGNOSIS — Z8673 Personal history of transient ischemic attack (TIA), and cerebral infarction without residual deficits: Secondary | ICD-10-CM

## 2023-02-27 DIAGNOSIS — I6782 Cerebral ischemia: Secondary | ICD-10-CM | POA: Diagnosis not present

## 2023-02-27 MED ORDER — GADOPICLENOL 0.5 MMOL/ML IV SOLN
8.0000 mL | Freq: Once | INTRAVENOUS | Status: AC | PRN
  Administered 2023-02-27: 8 mL via INTRAVENOUS

## 2023-03-06 ENCOUNTER — Other Ambulatory Visit: Payer: Self-pay | Admitting: Family Medicine

## 2023-03-22 ENCOUNTER — Other Ambulatory Visit: Payer: Self-pay | Admitting: Family Medicine

## 2023-03-22 DIAGNOSIS — F321 Major depressive disorder, single episode, moderate: Secondary | ICD-10-CM

## 2023-04-02 ENCOUNTER — Other Ambulatory Visit: Payer: Self-pay | Admitting: Family Medicine

## 2023-04-03 NOTE — Telephone Encounter (Signed)
Requested Prescriptions  Pending Prescriptions Disp Refills   clopidogrel (PLAVIX) 75 MG tablet [Pharmacy Med Name: CLOPIDOGREL 75MG  TABLETS] 90 tablet 1    Sig: TAKE 1 TABLET(75 MG) BY MOUTH DAILY     Hematology: Antiplatelets - clopidogrel Failed - 04/02/2023 11:03 AM      Failed - Valid encounter within last 6 months    Recent Outpatient Visits           12 months ago History of TIA (transient ischemic attack)   [facility] Pickard, Priscille Heidelberg, MD   1 year ago Long term (current) use of anticoagulants   [facility] Valentino Nose, NP   2 years ago History of TIA (transient ischemic attack)   [facility] Donita Brooks, MD   4 years ago General medical exam   [facility] Donita Brooks, MD   5 years ago General medical exam   [facility] Pickard, Priscille Heidelberg, MD              Passed - HCT in normal range and within 180 days    HCT  Date Value Ref Range Status  12/06/2022 43.5 38.5 - 50.0 % Final         Passed - HGB in normal range and within 180 days    Hemoglobin  Date Value Ref Range Status  12/06/2022 15.4 13.2 - 17.1 g/dL Final         Passed - PLT in normal range and within 180 days    Platelets  Date Value Ref Range Status  12/06/2022 226 140 - 400 Thousand/uL Final         Passed - Cr in normal range and within 360 days    Creat  Date Value Ref Range Status  12/06/2022 0.72 0.70 - 1.28 mg/dL Final

## 2023-04-10 ENCOUNTER — Telehealth: Payer: Self-pay | Admitting: Pharmacist

## 2023-04-10 NOTE — Progress Notes (Signed)
Care Management & Coordination Services Pharmacy Team  Reason for Encounter: Appointment Reminder  Contacted patient to confirm telephone appointment with Erskine Emery, PharmD on 04/11/2023 at 3:30 pm. Unsuccessful outreach. Left voicemail with appointment details.   Star Rating Drugs:  Atorvastatin 40 mg last filled 01/26/2023 90 DS Ramipril 10 mg last filled 01/26/2023 90 DS   Care Gaps: Annual wellness visit in last year? Yes   Future Appointments  Date Time Provider Department Center  04/11/2023  3:30 PM Erroll Luna [facility] None   April D Calhoun, [facility] Clinical Pharmacist Assistant [phone]

## 2023-04-11 ENCOUNTER — Ambulatory Visit: Payer: PPO | Admitting: Pharmacist

## 2023-04-11 NOTE — Progress Notes (Signed)
Care Management & Coordination Services Pharmacy Note  04/11/2023 Name:  Walter Cross MRN:  [id number] DOB:  October 16, 1951  Summary: PharmD FU visit for depression.  Patient now on Trintellix which he reports as helping.  May cause copay barrier.  Does state that he has noticed some difference in mood since starting this  Recommendations/Changes made from today's visit: FU 4-6 weeks on efficacy Will mail out patient assistance for Trintellix  Follow up plan: CMA 4-6 weeks PHQ-9 PharmD 6 months   Subjective: Walter Cross is an 72 y.o. year old male who is a primary patient of Pickard, Priscille Heidelberg, MD.  The care coordination team was consulted for assistance with disease management and care coordination needs.    Engaged with patient by telephone for follow up visit.  Recent office visits:  12/06/2022 OV (PCP) Donita Brooks, MD; I will have the patient hold tamsulosin to see if this improves. If lab work is normal, I will consider switching the patient's venlafaxine to Trintellix as his fatigue may be related to depression     Recent consult visits:  None   Hospital visits:  None in previous 6 months   Objective:  Lab Results  Component Value Date   CREATININE 0.72 12/06/2022   BUN 8 12/06/2022   GFR 96.72 04/16/2013   EGFR 97 12/06/2022   GFRNONAA >60 05/12/2021   GFRAA 100 12/04/2020   NA 140 12/06/2022   K 4.4 12/06/2022   CALCIUM 9.2 12/06/2022   CO2 26 12/06/2022   GLUCOSE 110 (H) 12/06/2022    Lab Results  Component Value Date/Time   HGBA1C 5.5 09/26/2020 03:06 AM   HGBA1C 5.8 (H) 03/17/2015 09:56 AM   GFR 96.72 04/16/2013 09:21 AM    Last diabetic Eye exam: No results found for: "HMDIABEYEEXA"  Last diabetic Foot exam: No results found for: "HMDIABFOOTEX"   Lab Results  Component Value Date   CHOL 155 04/04/2022   HDL 72 04/04/2022   LDLCALC 62 04/04/2022   LDLDIRECT 115.6 04/16/2013   TRIG 125 04/04/2022   CHOLHDL 2.2 04/04/2022        Latest Ref Rng & Units 12/06/2022   11:41 AM 04/04/2022    2:33 PM 03/08/2021   11:09 AM  Hepatic Function  Total Protein 6.1 - 8.1 g/dL 6.7  6.3  6.6   Albumin 3.7 - 4.7 g/dL   4.4   AST 10 - 35 U/L 21  24  18    ALT 9 - 46 U/L 19  30  18    Alk Phosphatase 44 - 121 IU/L   91   Total Bilirubin 0.2 - 1.2 mg/dL 0.6  0.8  0.5   Bilirubin, Direct 0.00 - 0.40 mg/dL   4.01     Lab Results  Component Value Date/Time   TSH 1.94 12/06/2022 11:41 AM   TSH 1.48 03/24/2017 09:59 AM       Latest Ref Rng & Units 12/06/2022   11:41 AM 04/04/2022    2:33 PM 05/12/2021    5:45 PM  CBC  WBC 3.8 - 10.8 Thousand/uL 6.2  5.9  6.9   Hemoglobin 13.2 - 17.1 g/dL 02.7  25.3  66.4   Hematocrit 38.5 - 50.0 % 43.5  42.5  42.8   Platelets 140 - 400 Thousand/uL 226  205  251     Lab Results  Component Value Date/Time   VITAMINB12 183 (L) 12/06/2022 11:41 AM   VITAMINB12 309 03/08/2021 12:00 AM    Clinical  ASCVD: No  The 10-year ASCVD risk score (Arnett DK, et al., 2019) is: 29.7%   Values used to calculate the score:     Age: 26 years     Sex: Male     Is Non-Hispanic African American: No     Diabetic: No     Tobacco smoker: No     Systolic Blood Pressure: 164 mmHg     Is BP treated: Yes     HDL Cholesterol: 72 mg/dL     Total Cholesterol: 155 mg/dL        12/03/1094    0:45 PM 05/14/2021   11:22 AM 04/09/2021    3:54 PM  Depression screen PHQ 2/9  Decreased Interest 0 0 0  Down, Depressed, Hopeless 0 0 0  PHQ - 2 Score 0 0 0     Social History   Tobacco Use  Smoking Status Former   Packs/day: 1.00   Years: 7.00   Additional pack years: 0.00   Total pack years: 7.00   Types: Cigarettes   Quit date: 11/29/1975   Years since quitting: 47.3  Smokeless Tobacco Never   BP Readings from Last 3 Encounters:  02/02/23 (!) 146/82  12/06/22 (!) 148/82  06/20/22 125/78   Pulse Readings from Last 3 Encounters:  02/02/23 73  12/06/22 86  06/20/22 67   Wt Readings from Last 3 Encounters:   02/02/23 181 lb 3.2 oz (82.2 kg)  12/06/22 179 lb (81.2 kg)  06/20/22 177 lb 6.4 oz (80.5 kg)   BMI Readings from Last 3 Encounters:  02/02/23 26.76 kg/m  12/06/22 26.43 kg/m  06/20/22 26.20 kg/m    Allergies  Allergen Reactions   Sulfa Antibiotics     Pt reports causes aches all over   Esomeprazole Magnesium Other (See Comments)    cramps   Septra [Bactrim] Other (See Comments)    Back ache   Sulfamethoxazole-Trimethoprim     Other reaction(s): Other (See Comments) Back ache Back ache Back ache     Medications Reviewed Today     Reviewed by Erroll Luna, [facility] (Pharmacist) on 04/11/23 at 1614  Med List Status: <None>   Medication Order Taking? Sig Documenting Provider Last Dose Status Informant  acyclovir ointment (ZOVIRAX) 5 % [id number] No Apply 1 application topically daily as needed (cold sores & herpes). Donita Brooks, MD Taking Active Self  atorvastatin (LIPITOR) 40 MG tablet [id number] No TAKE 1 TABLET(40 MG) BY MOUTH DAILY Nahser, Deloris Ping, MD Taking Active   carvedilol (COREG) 12.5 MG tablet [id number] No TAKE 1 TABLET(12.5 MG) BY MOUTH TWICE DAILY WITH A MEAL Nahser, Deloris Ping, MD Taking Active   Cholecalciferol (VITAMIN D) 50 MCG (2000 UT) CAPS [id number] No Take 1 capsule by mouth daily in the afternoon. [provider] Taking Active   clonazePAM (KLONOPIN) 0.5 MG tablet [id number]  TAKE 1 TABLET(0.5 MG) BY MOUTH TWICE DAILY AS NEEDED FOR ANXIETY Donita Brooks, MD  Active   clopidogrel (PLAVIX) 75 MG tablet [id number]  TAKE 1 TABLET(75 MG) BY MOUTH DAILY Donita Brooks, MD  Active   diltiazem (CARTIA XT) 180 MG 24 hr capsule [id number] No TAKE 1 CAPSULE(180 MG) BY MOUTH DAILY Nahser, Deloris Ping, MD Taking Active   folic acid (FOLVITE) 1 MG tablet [id number] No TAKE 2 TABLETS(2 MG) BY MOUTH DAILY Donita Brooks, MD Taking Active   nystatin-triamcinolone [facility] II) cream [id number] No Apply 1 application topically as needed. [provider] Taking Active Self  ondansetron (ZOFRAN) 4 MG tablet [id number]  Take 1 tablet (4 mg total) by mouth every 8 (eight) hours as needed for nausea or vomiting. Donita Brooks, MD  Active   pantoprazole (PROTONIX) 40 MG tablet [id number] No Take 1 tablet (40 mg total) by mouth daily. Donita Brooks, MD Taking Active   predniSONE (DELTASONE) 20 MG tablet [id number] No 3 tabs poqday 1-2, 2 tabs poqday 3-4, 1 tab poqday 5-6 Donita Brooks, MD Taking Active   promethazine (PHENERGAN) 25 MG tablet [id number]  Take 1 tablet (25 mg total) by mouth every 8 (eight) hours as needed for nausea or vomiting. Donita Brooks, MD  Active   ramipril (ALTACE) 10 MG capsule [id number]  TAKE 1 CAPSULE(10 MG) BY MOUTH DAILY Donita Brooks, MD  Active   sildenafil (REVATIO) 20 MG tablet [id number] No 3-5 tablets daily as needed Nahser, Deloris Ping, MD Taking Active Self  tamsulosin (FLOMAX) 0.4 MG CAPS capsule [id number]  TAKE 1 CAPSULE(0.4 MG) BY MOUTH DAILY Donita Brooks, MD  Active   vortioxetine HBr (TRINTELLIX) 10 MG TABS tablet [id number]  Take 1 tablet (10 mg total) by mouth daily. Donita Brooks, MD  Active             SDOH:  (Social Determinants of Health) assessments and interventions performed: Yes Financial Resource Strain: Low Risk  (04/11/2023)   Overall Financial Resource Strain (CARDIA)    Difficulty of Paying Living Expenses: Not hard at all   Food Insecurity: No Food Insecurity (04/11/2023)   Hunger Vital Sign    Worried About Running Out of Food in the Last Year: Never true    Ran Out of Food in the Last Year: Never true    SDOH Interventions    Flowsheet Row Clinical Support from 04/28/2022 in [facility] Clinical Support from 04/09/2021 in [facility]  SDOH Interventions    Food Insecurity Interventions Intervention Not Indicated Intervention Not Indicated  Housing Interventions Intervention Not Indicated Intervention Not Indicated   Transportation Interventions Intervention Not Indicated Intervention Not Indicated  Financial Strain Interventions Intervention Not Indicated Intervention Not Indicated  Physical Activity Interventions Other (Comments)  [Increase as tolerated.] Intervention Not Indicated  Stress Interventions Intervention Not Indicated Intervention Not Indicated  Social Connections Interventions Intervention Not Indicated Intervention Not Indicated       Medication Assistance: Application for Trintellix  medication assistance program. in process.  Anticipated assistance start date unknown.  See plan of care for additional detail.  Medication Access: Within the past 30 days, how often has patient missed a dose of medication? 0 Is a pillbox or other method used to improve adherence? No  Factors that may affect medication adherence? financial need Are meds synced by current pharmacy? No  Are meds delivered by current pharmacy? No  Does patient experience delays in picking up medications due to transportation concerns? No   [facility] Reviewed: Is patient disadvantaged to use [facility]?: Yes  Current Rx insurance plan: HTA Name and location of Current pharmacy:  [id number] [address] - [place], [place] - [address] [place] [address] Phone: [phone] Fax: [phone]  [id number] [address] - [place], [place] - [address] [place] [address] Phone: [phone] Fax: [phone]  [id number] [place], [place] - [address] AT [facility] [address] [facility] [address] [place] [address] Phone: [phone] Fax: [phone]  [facility]  services reviewed with patient today?: Yes  Patient requests to transfer care to [facility]?: No  Reason patient declined to change pharmacies: Disadvantaged due to insurance/mail  order  Compliance/Adherence/Medication fill history: [facility] Rating Drugs:  Atorvastatin 40 mg last filled 01/26/2023 90 DS Ramipril 10 mg last filled 01/26/2023 90 DS     Care Gaps: Annual wellness visit in last year? Yes   Assessment/Plan     Hypertension (BP goal <140/90) -Controlled, not assessed -Current treatment: Ramipril 10mg  daily Appropriate, Query effective,   Diltiazem 180 CD daily Appropriate, Query effective,  Carvedilol 12.5mg  BID Appropriate, Query effective, -Medications previously tried: none noted  -Current home readings: does not monitor all the time but reports it has been normal to on the high side  -Current exercise habits: not much lately, has recently wanted to be more active and started walking today actually.  Plans to increase physical activity -Denies hypotensive/hypertensive symptoms - however does report he is very tired all of the time -Educated on BP goals and benefits of medications for prevention of heart attack, stroke and kidney damage; Daily salt intake goal < 2300 mg; Exercise goal of 150 minutes per week; Importance of home blood pressure monitoring; Symptoms of hypotension and importance of maintaining adequate hydration; -Counseled to monitor BP at home weekly, document, and provide log at future appointments -Recommended to continue current medication Recommended he try switching Diltiazem to night time dosing to see if splitting up his daytime BP meds helps with daytime sleepiness.  Update 10/28/21 Tried switching diltiazem to bedtime this did not seem to help with sleepiness during the daytime.  His BP remains uncontrolled.  He reports systolic in the 150s and diastolic mostly in the 80s.  He has not been checking at home the past few weeks, however.  If BP remains elevated, he may benefit from addition of HCTZ 25mg  daily.  Will check in on BP in about 2-3 weeks to see how it is doing at home. Continue meds for now.  Update  05/05/22 Had to stop the doxazosin due to some swelling in the legs. Plans to get serious about walking an exercise and plans to walk at least 2-3 days per week. Reports pressure at home is up and down. Most systolic readings are > 140. We will let him try the exercise route for about 1 month and then check in to see how BP is running. Could still consider addition of HCTZ to current regimen,   Hyperlipidemia: (LDL goal < 70) -Controlled, not assessed -Current treatment: Atorvastatin 40mg  daily -Medications previously tried: none noted  -Educated on Cholesterol goals;  Benefits of statin for ASCVD risk reduction; Importance of limiting foods high in cholesterol; Exercise goal of 150 minutes per week; Strategies to manage statin-induced myalgias; -Most recent LDL is excellent - patient is concerned his tiredness may be coming from statin as well as memory loss.  Stressed importance of these medications on reducing cardiovascular events, he is agreeable to continue. -Recommended to continue current medication Patient interested in decreasing dose and plans to follow up with cardiology on this.    Depression/Anxiety (Goal: Minimize symptoms) 04/11/23 -Controlled, now on Trintellix -Current treatment: Trintellix 10mg  daily Appropriate, Query effective, ,  Clonazepam 0.5mg  prn Appropriate, Effective, Safe, Accessible -Medications previously tried/failed: Lexapro, Venlafaxine -PHQ9:     04/28/2022    3:49 PM 05/14/2021   11:22 AM 04/09/2021    3:54 PM  PHQ9 SCORE ONLY  PHQ-9 Total Score 0 0 0    -GAD7:  No flowsheet data found. -Educated on Benefits of medication for symptom control -Reports stable mood.  Did not do PHQ-9 today but will have CMA call to do PHQ-9 after 4-6 weeks of Trintellix.  He does mention possible copay barrier.  Will mail application through [employer] for PAP.   No changes to meds, CMA to call to do PHQ-9 in 4-6 weeks to assess efficacy.  Update 10/28/21 From  discussion it appears patient may have some untreated depression leading to lack of motivation.  He reports an overall lack of interest or motivation in getting up to do things.  Wants to get out and exercise just has not been able to do so.  Denies any suicidal ideation or thoughts of self harm.  Plans to makes appointment with PCP first of the year on this. Continue meds for now - may benefit from switch to Zoloft or Wellbutrin to see how this help. Contact providers with any new or worsening symptoms.  Update 05/05/22 Seems to have more motivation at today's visit.  However does report that he has days where he likes to sleep a lot of probably more than he should. Plans to increase his exercise to see if this helps with his overall mood. Will check in about a month from now to see how he is feeling. Could consider switch to another therapy if still feeling symptoms of depression.  Insomnia (Goal: Reduce symptoms) -Controlled, not assessed -Current treatment  None noted -Medications previously tried: none -No reports of sleeping disturbances, in fact he sleeps too much per his wife and his own reports -Could be some untreated depression - patient aware to follow up if it does not improve  -Recommended continue current management strategies  Patient Goals/Self-Care Activities Patient will:  - take medications as prescribed focus on medication adherence by pill box check blood pressure a few times per week, document, and provide at future appointments  Follow Up Plan: The care management team will reach out to the patient again over the next 120 days.           Willa Frater, PharmD Clinical Pharmacist  [facility] [phone]

## 2023-05-15 DIAGNOSIS — E669 Obesity, unspecified: Secondary | ICD-10-CM | POA: Diagnosis not present

## 2023-05-15 DIAGNOSIS — F32A Depression, unspecified: Secondary | ICD-10-CM | POA: Diagnosis not present

## 2023-05-15 DIAGNOSIS — Z8719 Personal history of other diseases of the digestive system: Secondary | ICD-10-CM | POA: Diagnosis not present

## 2023-05-15 DIAGNOSIS — Z87891 Personal history of nicotine dependence: Secondary | ICD-10-CM | POA: Diagnosis not present

## 2023-05-15 DIAGNOSIS — Z09 Encounter for follow-up examination after completed treatment for conditions other than malignant neoplasm: Secondary | ICD-10-CM | POA: Diagnosis not present

## 2023-05-15 DIAGNOSIS — K449 Diaphragmatic hernia without obstruction or gangrene: Secondary | ICD-10-CM | POA: Diagnosis not present

## 2023-05-15 DIAGNOSIS — D759 Disease of blood and blood-forming organs, unspecified: Secondary | ICD-10-CM | POA: Diagnosis not present

## 2023-05-15 DIAGNOSIS — Z6825 Body mass index (BMI) 25.0-25.9, adult: Secondary | ICD-10-CM | POA: Diagnosis not present

## 2023-05-15 DIAGNOSIS — Z79899 Other long term (current) drug therapy: Secondary | ICD-10-CM | POA: Diagnosis not present

## 2023-05-15 DIAGNOSIS — Z7902 Long term (current) use of antithrombotics/antiplatelets: Secondary | ICD-10-CM | POA: Diagnosis not present

## 2023-05-15 DIAGNOSIS — I10 Essential (primary) hypertension: Secondary | ICD-10-CM | POA: Diagnosis not present

## 2023-05-15 DIAGNOSIS — Z8673 Personal history of transient ischemic attack (TIA), and cerebral infarction without residual deficits: Secondary | ICD-10-CM | POA: Diagnosis not present

## 2023-05-15 DIAGNOSIS — K22710 Barrett's esophagus with low grade dysplasia: Secondary | ICD-10-CM | POA: Diagnosis not present

## 2023-05-15 DIAGNOSIS — K319 Disease of stomach and duodenum, unspecified: Secondary | ICD-10-CM | POA: Diagnosis not present

## 2023-06-28 NOTE — Patient Instructions (Incomplete)
[name] , Thank you for taking time to come for your Medicare Wellness Visit. I appreciate your ongoing commitment to your health goals. Please review the following plan we discussed and let me know if I can assist you in the future.   Referrals/Orders/Follow-Ups/Clinician Recommendations: Aim for 30 minutes of exercise or brisk walking, 6-8 glasses of water, and 5 servings of fruits and vegetables each day.  This is a list of the screening recommended for you and due dates:  Health Maintenance  Topic Date Due   Zoster (Shingles) Vaccine (1 of 2) Never done   Flu Shot  06/29/2023   Medicare Annual Wellness Visit  11/05/2023   Colon Cancer Screening  06/15/2030   Pneumonia Vaccine  Completed   Hepatitis C Screening  Completed   HPV Vaccine  Aged Out   DTaP/Tdap/Td vaccine  Discontinued   COVID-19 Vaccine  Discontinued    Advanced directives: (ACP Link)Information on Advanced Care Planning can be found at [facility] Advance Health Care Directives Advance Health Care Directives ([email or web address]   Next Medicare Annual Wellness Visit scheduled for next year: Yes  Preventive Care 65 Years and Older, Male  Preventive care refers to lifestyle choices and visits with your health care provider that can promote health and wellness. What does preventive care include? A yearly physical exam. This is also called an annual well check. Dental exams once or twice a year. Routine eye exams. Ask your health care provider how often you should have your eyes checked. Personal lifestyle choices, including: Daily care of your teeth and gums. Regular physical activity. Eating a healthy diet. Avoiding tobacco and drug use. Limiting alcohol use. Practicing safe sex. Taking low doses of aspirin every day. Taking vitamin and mineral supplements as recommended by your health care provider. What happens during an annual well check? The services and screenings done by your health care  provider during your annual well check will depend on your age, overall health, lifestyle risk factors, and family history of disease. Counseling  Your health care provider may ask you questions about your: Alcohol use. Tobacco use. Drug use. Emotional well-being. Home and relationship well-being. Sexual activity. Eating habits. History of falls. Memory and ability to understand (cognition). Work and work Astronomer. Screening  You may have the following tests or measurements: Height, weight, and BMI. Blood pressure. Lipid and cholesterol levels. These may be checked every 5 years, or more frequently if you are over 46 years old. Skin check. Lung cancer screening. You may have this screening every year starting at age 55 if you have a 30-pack-year history of smoking and currently smoke or have quit within the past 15 years. Fecal occult blood test (FOBT) of the stool. You may have this test every year starting at age 47. Flexible sigmoidoscopy or colonoscopy. You may have a sigmoidoscopy every 5 years or a colonoscopy every 10 years starting at age 66. Prostate cancer screening. Recommendations will vary depending on your family history and other risks. Hepatitis C blood test. Hepatitis B blood test. Sexually transmitted disease (STD) testing. Diabetes screening. This is done by checking your blood sugar (glucose) after you have not eaten for a while (fasting). You may have this done every 1-3 years. Abdominal aortic aneurysm (AAA) screening. You may need this if you are a current or former smoker. Osteoporosis. You may be screened starting at age 73 if you are at high risk. Talk with your health care provider about your test results, treatment  options, and if necessary, the need for more tests. Vaccines  Your health care provider may recommend certain vaccines, such as: Influenza vaccine. This is recommended every year. Tetanus, diphtheria, and acellular pertussis (Tdap, Td)  vaccine. You may need a Td booster every 10 years. Zoster vaccine. You may need this after age 63. Pneumococcal 13-valent conjugate (PCV13) vaccine. One dose is recommended after age 36. Pneumococcal polysaccharide (PPSV23) vaccine. One dose is recommended after age 21. Talk to your health care provider about which screenings and vaccines you need and how often you need them. This information is not intended to replace advice given to you by your health care provider. Make sure you discuss any questions you have with your health care provider. Document Released: 12/11/2015 Document Revised: 08/03/2016 Document Reviewed: 09/15/2015 Elsevier Interactive Patient Education  2017 [employer].  Fall Prevention in the Home Falls can cause injuries. They can happen to people of all ages. There are many things you can do to make your home safe and to help prevent falls. What can I do on the outside of my home? Regularly fix the edges of walkways and driveways and fix any cracks. Remove anything that might make you trip as you walk through a door, such as a raised step or threshold. Trim any bushes or trees on the path to your home. Use bright outdoor lighting. Clear any walking paths of anything that might make someone trip, such as rocks or tools. Regularly check to see if handrails are loose or broken. Make sure that both sides of any steps have handrails. Any raised decks and porches should have guardrails on the edges. Have any leaves, snow, or ice cleared regularly. Use sand or salt on walking paths during winter. Clean up any spills in your garage right away. This includes oil or grease spills. What can I do in the bathroom? Use night lights. Install grab bars by the toilet and in the tub and shower. Do not use towel bars as grab bars. Use non-skid mats or decals in the tub or shower. If you need to sit down in the shower, use a plastic, non-slip stool. Keep the floor dry. Clean up any  water that spills on the floor as soon as it happens. Remove soap buildup in the tub or shower regularly. Attach bath mats securely with double-sided non-slip rug tape. Do not have throw rugs and other things on the floor that can make you trip. What can I do in the bedroom? Use night lights. Make sure that you have a light by your bed that is easy to reach. Do not use any sheets or blankets that are too big for your bed. They should not hang down onto the floor. Have a firm chair that has side arms. You can use this for support while you get dressed. Do not have throw rugs and other things on the floor that can make you trip. What can I do in the kitchen? Clean up any spills right away. Avoid walking on wet floors. Keep items that you use a lot in easy-to-reach places. If you need to reach something above you, use a strong step stool that has a grab bar. Keep electrical cords out of the way. Do not use floor polish or wax that makes floors slippery. If you must use wax, use non-skid floor wax. Do not have throw rugs and other things on the floor that can make you trip. What can I do with my stairs? Do not  leave any items on the stairs. Make sure that there are handrails on both sides of the stairs and use them. Fix handrails that are broken or loose. Make sure that handrails are as long as the stairways. Check any carpeting to make sure that it is firmly attached to the stairs. Fix any carpet that is loose or worn. Avoid having throw rugs at the top or bottom of the stairs. If you do have throw rugs, attach them to the floor with carpet tape. Make sure that you have a light switch at the top of the stairs and the bottom of the stairs. If you do not have them, ask someone to add them for you. What else can I do to help prevent falls? Wear shoes that: Do not have high heels. Have rubber bottoms. Are comfortable and fit you well. Are closed at the toe. Do not wear sandals. If you use a  stepladder: Make sure that it is fully opened. Do not climb a closed stepladder. Make sure that both sides of the stepladder are locked into place. Ask someone to hold it for you, if possible. Clearly mark and make sure that you can see: Any grab bars or handrails. First and last steps. Where the edge of each step is. Use tools that help you move around (mobility aids) if they are needed. These include: Canes. Walkers. Scooters. Crutches. Turn on the lights when you go into a dark area. Replace any light bulbs as soon as they burn out. Set up your furniture so you have a clear path. Avoid moving your furniture around. If any of your floors are uneven, fix them. If there are any pets around you, be aware of where they are. Review your medicines with your doctor. Some medicines can make you feel dizzy. This can increase your chance of falling. Ask your doctor what other things that you can do to help prevent falls. This information is not intended to replace advice given to you by your health care provider. Make sure you discuss any questions you have with your health care provider. Document Released: 09/10/2009 Document Revised: 04/21/2016 Document Reviewed: 12/19/2014 Elsevier Interactive Patient Education  2017 [employer].

## 2023-06-28 NOTE — Progress Notes (Unsigned)
Subjective:   Walter Cross is a 72 y.o. male who presents for Medicare Annual/Subsequent preventive examination.  Visit Complete: {VISITMETHOD:[phone]}  Patient Medicare AWV questionnaire was completed by the patient on ***; I have confirmed that all information answered by patient is correct and no changes since this date.  Review of Systems           Objective:    There were no vitals filed for this visit. There is no height or weight on file to calculate BMI.     04/28/2022    3:53 PM 04/09/2021    3:52 PM 09/26/2020    1:00 AM  Advanced Directives  Does Patient Have a Medical Advance Directive? No No No  Does patient want to make changes to medical advance directive?  Yes (MAU/Ambulatory/Procedural Areas - Information given)   Would patient like information on creating a medical advance directive? No - Patient declined  No - Patient declined    Current Medications (verified) Outpatient Encounter Medications as of 06/29/2023  Medication Sig   acyclovir ointment (ZOVIRAX) 5 % Apply 1 application topically daily as needed (cold sores & herpes).   atorvastatin (LIPITOR) 40 MG tablet TAKE 1 TABLET(40 MG) BY MOUTH DAILY   carvedilol (COREG) 12.5 MG tablet TAKE 1 TABLET(12.5 MG) BY MOUTH TWICE DAILY WITH A MEAL   Cholecalciferol (VITAMIN D) 50 MCG (2000 UT) CAPS Take 1 capsule by mouth daily in the afternoon.   clonazePAM (KLONOPIN) 0.5 MG tablet TAKE 1 TABLET(0.5 MG) BY MOUTH TWICE DAILY AS NEEDED FOR ANXIETY   clopidogrel (PLAVIX) 75 MG tablet TAKE 1 TABLET(75 MG) BY MOUTH DAILY   diltiazem (CARTIA XT) 180 MG 24 hr capsule TAKE 1 CAPSULE(180 MG) BY MOUTH DAILY   folic acid (FOLVITE) 1 MG tablet TAKE 2 TABLETS(2 MG) BY MOUTH DAILY   nystatin-triamcinolone (MYCOLOG II) cream Apply 1 application topically as needed.   ondansetron (ZOFRAN) 4 MG tablet Take 1 tablet (4 mg total) by mouth every 8 (eight) hours as needed for nausea or vomiting.   pantoprazole (PROTONIX) 40 MG  tablet Take 1 tablet (40 mg total) by mouth daily.   predniSONE (DELTASONE) 20 MG tablet 3 tabs poqday 1-2, 2 tabs poqday 3-4, 1 tab poqday 5-6   promethazine (PHENERGAN) 25 MG tablet Take 1 tablet (25 mg total) by mouth every 8 (eight) hours as needed for nausea or vomiting.   ramipril (ALTACE) 10 MG capsule TAKE 1 CAPSULE(10 MG) BY MOUTH DAILY   sildenafil (REVATIO) 20 MG tablet 3-5 tablets daily as needed   tamsulosin (FLOMAX) 0.4 MG CAPS capsule TAKE 1 CAPSULE(0.4 MG) BY MOUTH DAILY   vortioxetine HBr (TRINTELLIX) 10 MG TABS tablet Take 1 tablet (10 mg total) by mouth daily.   No facility-administered encounter medications on file as of 06/29/2023.    Allergies (verified) Sulfa antibiotics, Esomeprazole magnesium, Septra [bactrim], and Sulfamethoxazole-trimethoprim   History: Past Medical History:  Diagnosis Date   Adenomatous colon polyp 2015    Buccini (q 5 yrs)    Barrett's esophagus    Benign colon polyp    Depression    Dermatitis    axillary   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 Echo 2007: mod LVH, diast  dysfn, mild MR, no LVOT obstruction;  b. echo 9/13: upper septal thickening, no SAM of MV, no LVOT gradient, mild LVH, EF 65%   TIA (transient ischemic attack)    Vitamin D deficiency    Past Surgical History:  Procedure Laterality Date   CERVICAL SPINE SURGERY  1992-1993   nerve release, Dr Gerlene Fee   SPINE SURGERY     L4-L5, [phone]   Family History  Problem Relation Age of Onset   Cancer Mother 63       cervical   Cancer Father        lung   Stroke Brother    Atrial fibrillation Other    Social History   Socioeconomic History   Marital status: Widowed    Spouse name: Not on file   Number of children: Not on file   Years of education:  Not on file   Highest education level: Not on file  Occupational History   Occupation: Optician, dispensing: [employer]  Tobacco Use   Smoking status: Former    Current packs/day: 0.00    Average packs/day: 1 pack/day for 7.0 years (7.0 ttl pk-yrs)    Types: Cigarettes    Start date: 11/28/1968    Quit date: 11/29/1975    Years since quitting: 47.6   Smokeless tobacco: Never  Vaping Use   Vaping status: Never Used  Substance and Sexual Activity   Alcohol use: Yes    Alcohol/week: 2.0 standard drinks of alcohol    Types: 2 Standard drinks or equivalent per week   Drug use: No   Sexual activity: Not Currently    Birth control/protection: None  Other Topics Concern   Not on file  Social History Narrative   Not on file   Social Determinants of Health   Financial Resource Strain: Low Risk  (04/11/2023)   Overall Financial Resource Strain (CARDIA)    Difficulty of Paying Living Expenses: Not hard at all  Food Insecurity: No Food Insecurity (04/11/2023)   Hunger Vital Sign    Worried About Running Out of Food in the Last Year: Never true    Ran Out of Food in the Last Year: Never true  Transportation Needs: Unknown (04/28/2022)   PRAPARE - Administrator, Civil Service (Medical): No    Lack of Transportation (Non-Medical): Not on file  Physical Activity: Insufficiently Active (04/28/2022)   Exercise Vital Sign    Days of Exercise per Week: 3 days    Minutes of Exercise per Session: 30 min  Stress: No Stress Concern Present (04/28/2022)   [employer] of Occupational Health - Occupational Stress Questionnaire    Feeling of Stress : Not at all  Social Connections: Socially Isolated (04/28/2022)   Social Connection and Isolation Panel [NHANES]    Frequency of Communication with Friends and Family: More than three times a week    Frequency of Social Gatherings with Friends and Family: Twice a week    Attends Religious Services: Never    Automotive engineer or Organizations: No    Attends Banker Meetings: Never    Marital Status: Widowed    Tobacco Counseling Counseling given: Not Answered   Clinical Intake:                        Activities of Daily Living     No data to display  Patient Care Team: Donita Brooks, MD as PCP - General (Family Medicine) Nahser, Deloris Ping, MD as PCP - Cardiology (Cardiology) Erroll Luna, [facility] (Inactive) as Pharmacist (Pharmacist)  Indicate any recent Medical Services you may have received from other than [facility] providers in the past year (date may be approximate).     Assessment:   This is a routine wellness examination for Walter Cross.  Hearing/Vision screen No results found.  Dietary issues and exercise activities discussed:     Goals Addressed   None    Depression Screen    04/28/2022    3:49 PM 05/14/2021   11:22 AM 04/09/2021    3:54 PM 10/27/2020    2:41 PM 04/01/2019   10:46 AM 03/26/2018    9:35 AM 03/26/2018    9:32 AM  PHQ 2/9 Scores  PHQ - 2 Score 0 0 0 0 0 0   Exception Documentation       Other- indicate reason in comment box  Not completed       Currently being treated    Fall Risk    04/28/2022    3:54 PM 09/01/2021    3:35 PM 05/14/2021   11:22 AM 04/09/2021    3:53 PM 04/01/2019   10:46 AM  Fall Risk   Falls in the past year? 0 0 0 0 0  Number falls in past yr: 0  0 0 0  Injury with Fall? 0  0 0   Risk for fall due to : No Fall Risks   No Fall Risks   Follow up Falls prevention discussed   Falls evaluation completed;Falls prevention discussed Falls evaluation completed    MEDICARE RISK AT HOME:   TIMED UP AND GO:  Was the test performed?  No    Cognitive Function:        04/28/2022    3:57 PM  6CIT Screen  What Year? 0 points  What month? 0 points  What time? 0 points  Count back from 20 0 points  Months in reverse 0 points  Repeat phrase 0 points  Total Score 0 points     Immunizations Immunization History  Administered Date(s) Administered   Fluad Quad(high Dose 65+) 11/05/2019, 09/16/2020   Influenza, High Dose Seasonal PF 09/05/2018   Influenza,inj,Quad PF,6+ Mos 09/20/2013, 11/19/2014, 09/17/2015, 09/13/2016, 11/03/2017   Influenza-Unspecified 10/08/2021   Pneumococcal Conjugate-13 03/18/2016   Pneumococcal Polysaccharide-23 03/17/2015   Tdap 02/16/2011    TDAP status: Due, Education has been provided regarding the importance of this vaccine. Advised may receive this vaccine at local pharmacy or Health Dept. Aware to provide a copy of the vaccination record if obtained from local pharmacy or Health Dept. Verbalized acceptance and understanding.  Pneumococcal vaccine status: Up to date  Covid-19 vaccine status: Information provided on how to obtain vaccines.   Qualifies for Shingles Vaccine? Yes   Zostavax completed No   Shingrix Completed?: No.    Education has been provided regarding the importance of this vaccine. Patient has been advised to call insurance company to determine out of pocket expense if they have not yet received this vaccine. Advised may also receive vaccine at local pharmacy or Health Dept. Verbalized acceptance and understanding.  Screening Tests Health Maintenance  Topic Date Due   Zoster Vaccines- Shingrix (1 of 2) Never done   INFLUENZA VACCINE  06/29/2023   Medicare Annual Wellness (AWV)  11/05/2023   Colonoscopy  06/15/2030   Pneumonia Vaccine 29+ Years old  Completed  Hepatitis C Screening  Completed   HPV VACCINES  Aged Out   DTaP/Tdap/Td  Discontinued   COVID-19 Vaccine  Discontinued    Health Maintenance  Health Maintenance Due  Topic Date Due   Zoster Vaccines- Shingrix (1 of 2) Never done    Colorectal cancer screening: Type of screening: Colonoscopy. Completed 06/15/20. Repeat every 10 years  Lung Cancer Screening: (Low Dose CT Chest recommended if Age 4-80 years, 20 pack-year currently smoking OR  have quit w/in 15years.) does not qualify.   Lung Cancer Screening Referral: n/a  Additional Screening:  Hepatitis C Screening: does qualify; Completed 03/18/16  Vision Screening: Recommended annual ophthalmology exams for early detection of glaucoma and other disorders of the eye. Is the patient up to date with their annual eye exam?  {YES/NO:21197} Who is the provider or what is the name of the office in which the patient attends annual eye exams? *** If pt is not established with a provider, would they like to be referred to a provider to establish care? {YES/NO:21197}.   Dental Screening: Recommended annual dental exams for proper oral hygiene  Community Resource Referral / Chronic Care Management: CRR required this visit?  {YES/NO:21197}  CCM required this visit?  {CCM Required [phone]}     Plan:     I have personally reviewed and noted the following in the patient's chart:   Medical and social history Use of alcohol, tobacco or illicit drugs  Current medications and supplements including opioid prescriptions. {Opioid Prescriptions:[phone]} Functional ability and status Nutritional status Physical activity Advanced directives List of other physicians Hospitalizations, surgeries, and ER visits in previous 12 months Vitals Screenings to include cognitive, depression, and falls Referrals and appointments  In addition, I have reviewed and discussed with patient certain preventive protocols, quality metrics, and best practice recommendations. A written personalized care plan for preventive services as well as general preventive health recommendations were provided to patient.     Kandis Fantasia [place], [place]   03/06/8118   After Visit Summary: {CHL AMB AWV After Visit Summary:[phone]}  Nurse Notes: ***

## 2023-06-29 ENCOUNTER — Ambulatory Visit (INDEPENDENT_AMBULATORY_CARE_PROVIDER_SITE_OTHER): Payer: PPO

## 2023-06-29 VITALS — Ht 69.0 in | Wt 181.0 lb

## 2023-06-29 DIAGNOSIS — Z0000 Encounter for general adult medical examination without abnormal findings: Secondary | ICD-10-CM | POA: Diagnosis not present

## 2023-07-03 ENCOUNTER — Other Ambulatory Visit: Payer: Self-pay | Admitting: Family Medicine

## 2023-07-04 NOTE — Telephone Encounter (Signed)
Last refill 04/03/23 #90 1RF, too soon Requested Prescriptions  Pending Prescriptions Disp Refills   clopidogrel (PLAVIX) 75 MG tablet [Pharmacy Med Name: CLOPIDOGREL 75MG  TABLETS] 90 tablet 1    Sig: TAKE 1 TABLET(75 MG) BY MOUTH DAILY     Hematology: Antiplatelets - clopidogrel Failed - 07/03/2023  8:03 AM      Failed - HCT in normal range and within 180 days    HCT  Date Value Ref Range Status  12/06/2022 43.5 38.5 - 50.0 % Final         Failed - HGB in normal range and within 180 days    Hemoglobin  Date Value Ref Range Status  12/06/2022 15.4 13.2 - 17.1 g/dL Final         Failed - PLT in normal range and within 180 days    Platelets  Date Value Ref Range Status  12/06/2022 226 140 - 400 Thousand/uL Final         Failed - Valid encounter within last 6 months    Recent Outpatient Visits           1 year ago History of TIA (transient ischemic attack)   [facility] Donita Brooks, MD   2 years ago Long term (current) use of anticoagulants   [facility] Valentino Nose, NP   2 years ago History of TIA (transient ischemic attack)   [facility] Pickard, Priscille Heidelberg, MD   4 years ago General medical exam   [facility] Donita Brooks, MD   5 years ago General medical exam   [facility] Donita Brooks, MD              Passed - Cr in normal range and within 360 days    Creat  Date Value Ref Range Status  12/06/2022 0.72 0.70 - 1.28 mg/dL Final

## 2023-07-27 ENCOUNTER — Other Ambulatory Visit: Payer: Self-pay | Admitting: Cardiovascular Disease

## 2023-07-27 DIAGNOSIS — I421 Obstructive hypertrophic cardiomyopathy: Secondary | ICD-10-CM

## 2023-07-27 DIAGNOSIS — I10 Essential (primary) hypertension: Secondary | ICD-10-CM

## 2023-07-27 MED ORDER — ATORVASTATIN CALCIUM 40 MG PO TABS: ORAL_TABLET | ORAL | 0 refills | Status: DC

## 2023-08-21 ENCOUNTER — Other Ambulatory Visit: Payer: Self-pay | Admitting: Cardiovascular Disease

## 2023-08-21 DIAGNOSIS — I10 Essential (primary) hypertension: Secondary | ICD-10-CM

## 2023-08-21 DIAGNOSIS — I421 Obstructive hypertrophic cardiomyopathy: Secondary | ICD-10-CM

## 2023-08-22 ENCOUNTER — Other Ambulatory Visit: Payer: Self-pay

## 2023-08-24 ENCOUNTER — Other Ambulatory Visit: Payer: Self-pay | Admitting: Cardiovascular Disease

## 2023-08-24 DIAGNOSIS — H40053 Ocular hypertension, bilateral: Secondary | ICD-10-CM | POA: Diagnosis not present

## 2023-08-24 DIAGNOSIS — I10 Essential (primary) hypertension: Secondary | ICD-10-CM

## 2023-08-24 DIAGNOSIS — I421 Obstructive hypertrophic cardiomyopathy: Secondary | ICD-10-CM

## 2023-08-24 DIAGNOSIS — H40013 Open angle with borderline findings, low risk, bilateral: Secondary | ICD-10-CM | POA: Diagnosis not present

## 2023-10-03 ENCOUNTER — Encounter: Payer: Self-pay | Admitting: Family Medicine

## 2023-10-09 ENCOUNTER — Ambulatory Visit (INDEPENDENT_AMBULATORY_CARE_PROVIDER_SITE_OTHER): Payer: PPO | Admitting: Family Medicine

## 2023-10-09 VITALS — BP 190/95 | HR 84 | Temp 98.2°F | Ht 69.0 in | Wt 176.0 lb

## 2023-10-09 DIAGNOSIS — E538 Deficiency of other specified B group vitamins: Secondary | ICD-10-CM

## 2023-10-09 DIAGNOSIS — I10 Essential (primary) hypertension: Secondary | ICD-10-CM | POA: Diagnosis not present

## 2023-10-09 DIAGNOSIS — R413 Other amnesia: Secondary | ICD-10-CM

## 2023-10-09 DIAGNOSIS — Z8673 Personal history of transient ischemic attack (TIA), and cerebral infarction without residual deficits: Secondary | ICD-10-CM

## 2023-10-09 MED ORDER — HYDROCHLOROTHIAZIDE 25 MG PO TABS
25.0000 mg | ORAL_TABLET | Freq: Every day | ORAL | 3 refills | Status: DC
Start: 2023-10-09 — End: 2023-11-06

## 2023-10-09 MED ORDER — BUPROPION HCL ER (XL) 150 MG PO TB24
150.0000 mg | ORAL_TABLET | Freq: Every day | ORAL | 1 refills | Status: DC
Start: 2023-10-09 — End: 2023-11-09

## 2023-10-09 NOTE — Progress Notes (Signed)
Subjective:    Patient ID: Walter Cross, male    DOB: 04/03/1951, 72 y.o.   MRN: [id number]  Patient is here today for follow-up.  He states that the Trintellix is not working well.  He reports increased irritation.  He reports losing his temper more easily.  He reports feeling sad.  He reports lack of motivation and anhedonia.  He denies any suicidal thoughts.  However concerningly, his blood pressure is very high today.  He has been checking it at home and getting systolic blood pressures in the 130 range.  However I did check that today and found it to be extremely high.  This was verified with his own cuff.  He denies any chest pain shortness of breath or dyspnea on exertion.  Of note he was diagnosed with B12 deficiency earlier this year.  He is some mild memory loss and lack of energy but he has not been taking his B12. Past Surgical History:  Procedure Laterality Date   CERVICAL SPINE SURGERY  [phone]   nerve release, Dr Gerlene Fee   SPINE SURGERY     L4-L5, [phone]   Current Outpatient Medications on File Prior to Visit  Medication Sig Dispense Refill   acyclovir ointment (ZOVIRAX) 5 % Apply 1 application topically daily as needed (cold sores & herpes). 15 g 1   atorvastatin (LIPITOR) 40 MG tablet TAKE 1 TABLET(40 MG) BY MOUTH DAILY 90 tablet 0   carvedilol (COREG) 12.5 MG tablet TAKE 1 TABLET(12.5 MG) BY MOUTH TWICE DAILY WITH A MEAL 180 tablet 0   Cholecalciferol (VITAMIN D) 50 MCG (2000 UT) CAPS Take 1 capsule by mouth daily in the afternoon.     clonazePAM (KLONOPIN) 0.5 MG tablet TAKE 1 TABLET(0.5 MG) BY MOUTH TWICE DAILY AS NEEDED FOR ANXIETY 30 tablet 1   clopidogrel (PLAVIX) 75 MG tablet TAKE 1 TABLET(75 MG) BY MOUTH DAILY 90 tablet 1   diltiazem (CARDIZEM CD) 180 MG 24 hr capsule TAKE 1 CAPSULE(180 MG) BY MOUTH DAILY 90 capsule 0   folic acid (FOLVITE) 1 MG tablet TAKE 2 TABLETS(2 MG) BY MOUTH DAILY 180 tablet 0   pantoprazole (PROTONIX) 40 MG tablet Take 1 tablet (40  mg total) by mouth daily. 90 tablet 1   ramipril (ALTACE) 10 MG capsule TAKE 1 CAPSULE(10 MG) BY MOUTH DAILY 90 capsule 3   sildenafil (REVATIO) 20 MG tablet 3-5 tablets daily as needed 50 tablet 6   tamsulosin (FLOMAX) 0.4 MG CAPS capsule TAKE 1 CAPSULE(0.4 MG) BY MOUTH DAILY 90 capsule 3   vortioxetine HBr (TRINTELLIX) 10 MG TABS tablet Take 1 tablet (10 mg total) by mouth daily. 90 tablet 3   No current facility-administered medications on file prior to visit.     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[employer]  Tobacco Use   Smoking  status: Former    Current packs/day: 0.00    Average packs/day: 1 pack/day for 7.0 years (7.0 ttl pk-yrs)    Types: Cigarettes    Start date: 11/28/1968    Quit date: 11/29/1975    Years since quitting: 47.8   Smokeless tobacco: Never  Vaping Use   Vaping status: Never Used  Substance and Sexual Activity   Alcohol use: Yes    Alcohol/week: 2.0 standard drinks of alcohol    Types: 2 Standard drinks or equivalent per week   Drug use: No   Sexual activity: Not Currently    Birth control/protection: None  Other Topics Concern   Not on file  Social History Narrative   Not on file   Social Determinants of Health   Financial Resource Strain: Low Risk  (10/05/2023)   Overall Financial Resource Strain (CARDIA)    Difficulty of Paying Living Expenses: Not hard at all  Food  Insecurity: No Food Insecurity (10/05/2023)   Hunger Vital Sign    Worried About Running Out of Food in the Last Year: Never true    Ran Out of Food in the Last Year: Never true  Transportation Needs: No Transportation Needs (10/05/2023)   PRAPARE - Administrator, Civil Service (Medical): No    Lack of Transportation (Non-Medical): No  Physical Activity: Inactive (10/05/2023)   Exercise Vital Sign    Days of Exercise per Week: 0 days    Minutes of Exercise per Session: 30 min  Stress: Stress Concern Present (10/05/2023)   [employer] of Occupational Health - Occupational Stress Questionnaire    Feeling of Stress : To some extent  Social Connections: Moderately Integrated (10/05/2023)   Social Connection and Isolation Panel [NHANES]    Frequency of Communication with Friends and Family: Twice a week    Frequency of Social Gatherings with Friends and Family: Twice a week    Attends Religious Services: 1 to 4 times per year    Active Member of [employer] or Organizations: No    Attends Banker Meetings: Never    Marital Status: Living with partner  Intimate Partner Violence: Not At Risk (06/29/2023)   Humiliation, Afraid, Rape, and Kick questionnaire    Fear of Current or Ex-Partner: No    Emotionally Abused: No    Physically Abused: No    Sexually Abused: No   Family History  Problem Relation Age of Onset   Cancer Mother 81       cervical   Cancer Father        lung   Stroke Brother    Atrial fibrillation Other      Review of Systems  All other systems reviewed and are negative.      Objective:   Physical Exam Vitals reviewed.  Constitutional:      General: He is not in acute distress.    Appearance: He is well-developed. He is not diaphoretic.  HENT:     Head: Normocephalic and atraumatic.     Right Ear: External ear normal.     Left Ear: External ear normal.     Nose: Nose normal.     Mouth/Throat:     Pharynx: No oropharyngeal exudate.   Eyes:     General: No scleral icterus.       Right eye: No discharge.        Left eye: No discharge.     Conjunctiva/sclera: Conjunctivae normal.     Pupils: Pupils are equal, round,  and reactive to light.  Neck:     Thyroid: No thyromegaly.     Vascular: No JVD.     Trachea: No tracheal deviation.  Cardiovascular:     Rate and Rhythm: Normal rate and regular rhythm.     Heart sounds: Normal heart sounds. No murmur heard.    No friction rub. No gallop.  Pulmonary:     Effort: Pulmonary effort is normal. No respiratory distress.     Breath sounds: Normal breath sounds. No stridor. No wheezing or rales.  Chest:     Chest wall: No tenderness.  Abdominal:     General: Bowel sounds are normal. There is no distension.     Palpations: Abdomen is soft. There is no mass.     Tenderness: There is no abdominal tenderness. There is no guarding or rebound.  Genitourinary:    Prostate: Normal.     Rectum: Normal.  Musculoskeletal:        General: No tenderness. Normal range of motion.     Cervical back: Normal range of motion and neck supple.  Lymphadenopathy:     Cervical: No cervical adenopathy.  Skin:    General: Skin is warm.     Coloration: Skin is not pale.     Findings: No erythema or rash.  Neurological:     Mental Status: He is alert and oriented to person, place, and time.     Cranial Nerves: No cranial nerve deficit.     Motor: No abnormal muscle tone.     Coordination: Coordination normal.     Deep Tendon Reflexes: Reflexes are normal and symmetric.  Psychiatric:        Behavior: Behavior normal.        Thought Content: Thought content normal.        Judgment: Judgment normal.         Assessment & Plan:  History of TIA (transient ischemic attack) - Plan: CBC with Differential/Platelet, COMPLETE METABOLIC PANEL WITH GFR, Lipid panel  Vitamin B12 deficiency - Plan: CBC with Differential/Platelet, COMPLETE METABOLIC PANEL WITH GFR, Lipid panel, Vitamin B12  Memory  loss - Plan: CBC with Differential/Platelet, COMPLETE METABOLIC PANEL WITH GFR, Lipid panel  Primary hypertension - Plan: CBC with Differential/Platelet, COMPLETE METABOLIC PANEL WITH GFR, Lipid panel  Blood pressure is much higher than his numbers from home.  However I am going to go ahead and start hydrochlorothiazide 25 mg a day and recheck blood pressure in 1 week.  Start B12 1000 mcg daily to help prevent further memory loss and neuropathy.  Add Wellbutrin extended release 150 mg daily to the Trintellix for depression.  Check CBC CMP lipid panel and B12.  Goal LDL cholesterol is less than 70 given his history of TIA.  Recheck next week monitor his blood pressure more closely.  He is asymptomatic today.

## 2023-10-10 LAB — CBC WITH DIFFERENTIAL/PLATELET
Absolute Lymphocytes: 936 {cells}/uL (ref 850–3900)
Absolute Monocytes: 763 {cells}/uL (ref 200–950)
Basophils Absolute: 58 {cells}/uL (ref 0–200)
Basophils Relative: 0.8 %
Eosinophils Absolute: 94 {cells}/uL (ref 15–500)
Eosinophils Relative: 1.3 %
HCT: 42.2 % (ref 38.5–50.0)
Hemoglobin: 14.3 g/dL (ref 13.2–17.1)
MCH: 33.6 pg — ABNORMAL HIGH (ref 27.0–33.0)
MCHC: 33.9 g/dL (ref 32.0–36.0)
MCV: 99.3 fL (ref 80.0–100.0)
MPV: 11.1 fL (ref 7.5–12.5)
Monocytes Relative: 10.6 %
Neutro Abs: 5350 {cells}/uL (ref 1500–7800)
Neutrophils Relative %: 74.3 %
Platelets: 221 10*3/uL (ref 140–400)
RBC: 4.25 10*6/uL (ref 4.20–5.80)
RDW: 12.7 % (ref 11.0–15.0)
Total Lymphocyte: 13 %
WBC: 7.2 10*3/uL (ref 3.8–10.8)

## 2023-10-10 LAB — COMPLETE METABOLIC PANEL WITH GFR
AG Ratio: 1.6 (calc) (ref 1.0–2.5)
ALT: 15 U/L (ref 9–46)
AST: 20 U/L (ref 10–35)
Albumin: 4.1 g/dL (ref 3.6–5.1)
Alkaline phosphatase (APISO): 72 U/L (ref 35–144)
BUN: 10 mg/dL (ref 7–25)
CO2: 31 mmol/L (ref 20–32)
Calcium: 8.7 mg/dL (ref 8.6–10.3)
Chloride: 102 mmol/L (ref 98–110)
Creat: 0.75 mg/dL (ref 0.70–1.28)
Globulin: 2.5 g/dL (ref 1.9–3.7)
Glucose, Bld: 103 mg/dL — ABNORMAL HIGH (ref 65–99)
Potassium: 4 mmol/L (ref 3.5–5.3)
Sodium: 144 mmol/L (ref 135–146)
Total Bilirubin: 0.9 mg/dL (ref 0.2–1.2)
Total Protein: 6.6 g/dL (ref 6.1–8.1)
eGFR: 95 mL/min/{1.73_m2} (ref >=60)

## 2023-10-10 LAB — VITAMIN B12: Vitamin B-12: 159 pg/mL — ABNORMAL LOW (ref 200–1100)

## 2023-10-10 LAB — LIPID PANEL
Cholesterol: 151 mg/dL (ref <200)
HDL: 100 mg/dL (ref >=40)
LDL Cholesterol (Calc): 34 mg/dL
Non-HDL Cholesterol (Calc): 51 mg/dL (ref <130)
Total CHOL/HDL Ratio: 1.5 (calc) (ref <5.0)
Triglycerides: 86 mg/dL (ref <150)

## 2023-10-11 ENCOUNTER — Other Ambulatory Visit: Payer: Self-pay

## 2023-10-11 DIAGNOSIS — E538 Deficiency of other specified B group vitamins: Secondary | ICD-10-CM

## 2023-10-12 ENCOUNTER — Telehealth: Payer: Self-pay | Admitting: Family Medicine

## 2023-10-12 NOTE — Telephone Encounter (Signed)
Patient returned the call she missed from the clinic; left message with [id number] to request call back.  Please advise at [phone].

## 2023-10-17 ENCOUNTER — Emergency Department (HOSPITAL_COMMUNITY)
Admission: EM | Admit: 2023-10-17 | Discharge: 2023-10-17 | Disposition: A | Discharge status: Home / Self Care | Payer: PPO | Attending: Emergency Medicine | Admitting: Emergency Medicine

## 2023-10-17 ENCOUNTER — Encounter: Payer: PPO | Admitting: Pharmacist

## 2023-10-17 ENCOUNTER — Encounter (HOSPITAL_COMMUNITY): Payer: Self-pay

## 2023-10-17 DIAGNOSIS — E876 Hypokalemia: Secondary | ICD-10-CM | POA: Diagnosis not present

## 2023-10-17 DIAGNOSIS — F10220 Alcohol dependence with intoxication, uncomplicated: Secondary | ICD-10-CM | POA: Diagnosis not present

## 2023-10-17 DIAGNOSIS — I10 Essential (primary) hypertension: Secondary | ICD-10-CM | POA: Insufficient documentation

## 2023-10-17 DIAGNOSIS — R55 Syncope and collapse: Secondary | ICD-10-CM

## 2023-10-17 DIAGNOSIS — I959 Hypotension, unspecified: Secondary | ICD-10-CM | POA: Diagnosis not present

## 2023-10-17 DIAGNOSIS — Z860101 Personal history of adenomatous and serrated colon polyps: Secondary | ICD-10-CM | POA: Diagnosis not present

## 2023-10-17 DIAGNOSIS — Z79899 Other long term (current) drug therapy: Secondary | ICD-10-CM | POA: Diagnosis not present

## 2023-10-17 DIAGNOSIS — K22719 Barrett's esophagus with dysplasia, unspecified: Secondary | ICD-10-CM | POA: Diagnosis not present

## 2023-10-17 DIAGNOSIS — F10129 Alcohol abuse with intoxication, unspecified: Secondary | ICD-10-CM | POA: Diagnosis not present

## 2023-10-17 DIAGNOSIS — K219 Gastro-esophageal reflux disease without esophagitis: Secondary | ICD-10-CM | POA: Diagnosis not present

## 2023-10-17 DIAGNOSIS — Y906 Blood alcohol level of 120-199 mg/100 ml: Secondary | ICD-10-CM | POA: Diagnosis not present

## 2023-10-17 DIAGNOSIS — Z7902 Long term (current) use of antithrombotics/antiplatelets: Secondary | ICD-10-CM | POA: Insufficient documentation

## 2023-10-17 DIAGNOSIS — F10929 Alcohol use, unspecified with intoxication, unspecified: Secondary | ICD-10-CM

## 2023-10-17 LAB — CBC WITH DIFFERENTIAL/PLATELET
Abs Immature Granulocytes: 0.01 10*3/uL (ref 0.00–0.07)
Basophils Absolute: 0.1 10*3/uL (ref 0.0–0.1)
Basophils Relative: 1 %
Eosinophils Absolute: 0.1 10*3/uL (ref 0.0–0.5)
Eosinophils Relative: 1 %
HCT: 39.2 % (ref 39.0–52.0)
Hemoglobin: 13.8 g/dL (ref 13.0–17.0)
Immature Granulocytes: 0 %
Lymphocytes Relative: 14 %
Lymphs Abs: 1 10*3/uL (ref 0.7–4.0)
MCH: 34.2 pg — ABNORMAL HIGH (ref 26.0–34.0)
MCHC: 35.2 g/dL (ref 30.0–36.0)
MCV: 97.3 fL (ref 80.0–100.0)
Monocytes Absolute: 0.9 10*3/uL (ref 0.1–1.0)
Monocytes Relative: 13 %
Neutro Abs: 4.7 10*3/uL (ref 1.7–7.7)
Neutrophils Relative %: 71 %
Platelets: 234 10*3/uL (ref 150–400)
RBC: 4.03 MIL/uL — ABNORMAL LOW (ref 4.22–5.81)
RDW: 12.6 % (ref 11.5–15.5)
WBC: 6.7 10*3/uL (ref 4.0–10.5)
nRBC: 0 % (ref 0.0–0.2)

## 2023-10-17 LAB — BASIC METABOLIC PANEL
Anion gap: 11 (ref 5–15)
BUN: 16 mg/dL (ref 8–23)
CO2: 25 mmol/L (ref 22–32)
Calcium: 7.7 mg/dL — ABNORMAL LOW (ref 8.9–10.3)
Chloride: 102 mmol/L (ref 98–111)
Creatinine, Ser: 1.01 mg/dL (ref 0.61–1.24)
GFR, Estimated: >60 mL/min (ref >60)
Glucose, Bld: 128 mg/dL — ABNORMAL HIGH (ref 70–99)
Potassium: 2.9 mmol/L — ABNORMAL LOW (ref 3.5–5.1)
Sodium: 138 mmol/L (ref 135–145)

## 2023-10-17 LAB — ETHANOL: Alcohol, Ethyl (B): 163 mg/dL — ABNORMAL HIGH (ref <10)

## 2023-10-17 LAB — CBG MONITORING, ED: Glucose-Capillary: 140 mg/dL — ABNORMAL HIGH (ref 70–99)

## 2023-10-17 MED ORDER — POTASSIUM CHLORIDE CRYS ER 20 MEQ PO TBCR
40.0000 meq | EXTENDED_RELEASE_TABLET | Freq: Once | ORAL | Status: AC
  Administered 2023-10-17: 40 meq via ORAL
  Filled 2023-10-17: qty 2

## 2023-10-17 MED ORDER — POTASSIUM CHLORIDE CRYS ER 20 MEQ PO TBCR: 20.0000 meq | EXTENDED_RELEASE_TABLET | Freq: Two times a day (BID) | ORAL | 0 refills | Status: DC

## 2023-10-17 NOTE — ED Provider Notes (Signed)
[place] EMERGENCY DEPARTMENT AT [facility] Provider Note   CSN: [id number] Arrival date & time: 10/17/23  1805     History  Chief Complaint  Patient presents with   Loss of Consciousness   Hypotension    Walter Cross is a 72 y.o. male.   Loss of Consciousness    Patient has a history of hyperlipidemia hypertension acid reflux TIA hypertrophic cardiomyopathy.  Patient presents ED for evaluation of a syncopal episode.  Patient states he had a beer at home.  He then went to a bar and was having his second beer when he apparently passed out at the bar.  Patient did not fall but apparently put his head on the tabletop and was unresponsive.  EMS was called.  Patient does not remember what happened.  He states he feels fine now.  No report of any seizures.  He is not having any chest pain or shortness of breath.  No vomiting or diarrhea.  No fevers or chills.  He denies any numbness or weakness.  He denies any prior history of heart rhythm problems or fainting spells  Home Medications Prior to Admission medications   Medication Sig Start Date End Date Taking? Authorizing Provider  potassium chloride SA (KLOR-CON M) 20 MEQ tablet Take 1 tablet (20 mEq total) by mouth 2 (two) times daily for 5 days. 10/17/23 10/22/23 Yes Linwood Dibbles, MD  acyclovir ointment (ZOVIRAX) 5 % Apply 1 application topically daily as needed (cold sores & herpes). 03/18/16   Donita Brooks, MD  atorvastatin (LIPITOR) 40 MG tablet TAKE 1 TABLET(40 MG) BY MOUTH DAILY 08/22/23   Nahser, Deloris Ping, MD  buPROPion (WELLBUTRIN XL) 150 MG 24 hr tablet Take 1 tablet (150 mg total) by mouth daily. 10/09/23   Donita Brooks, MD  carvedilol (COREG) 12.5 MG tablet TAKE 1 TABLET(12.5 MG) BY MOUTH TWICE DAILY WITH A MEAL 08/22/23   Nahser, Deloris Ping, MD  Cholecalciferol (VITAMIN D) 50 MCG (2000 UT) CAPS Take 1 capsule by mouth daily in the afternoon.    [provider]  clonazePAM (KLONOPIN) 0.5 MG  tablet TAKE 1 TABLET(0.5 MG) BY MOUTH TWICE DAILY AS NEEDED FOR ANXIETY 03/23/23   Donita Brooks, MD  clopidogrel (PLAVIX) 75 MG tablet TAKE 1 TABLET(75 MG) BY MOUTH DAILY 04/03/23   Donita Brooks, MD  diltiazem (CARDIZEM CD) 180 MG 24 hr capsule TAKE 1 CAPSULE(180 MG) BY MOUTH DAILY 08/24/23   Nahser, Deloris Ping, MD  folic acid (FOLVITE) 1 MG tablet TAKE 2 TABLETS(2 MG) BY MOUTH DAILY 02/01/22   Donita Brooks, MD  hydrochlorothiazide (HYDRODIURIL) 25 MG tablet Take 1 tablet (25 mg total) by mouth daily. 10/09/23   Donita Brooks, MD  pantoprazole (PROTONIX) 40 MG tablet Take 1 tablet (40 mg total) by mouth daily. 12/02/20   Donita Brooks, MD  ramipril (ALTACE) 10 MG capsule TAKE 1 CAPSULE(10 MG) BY MOUTH DAILY 01/19/23   Donita Brooks, MD  sildenafil (REVATIO) 20 MG tablet 3-5 tablets daily as needed 12/05/19   Nahser, Deloris Ping, MD  tamsulosin (FLOMAX) 0.4 MG CAPS capsule TAKE 1 CAPSULE(0.4 MG) BY MOUTH DAILY 01/19/23   Donita Brooks, MD  vortioxetine HBr (TRINTELLIX) 10 MG TABS tablet Take 1 tablet (10 mg total) by mouth daily. 01/19/23   Donita Brooks, MD      Allergies    Sulfa antibiotics, Esomeprazole magnesium, Septra [bactrim], and Sulfamethoxazole-trimethoprim    Review of Systems  Review of Systems  Cardiovascular:  Positive for syncope.    Physical Exam Updated Vital Signs BP 127/68   Pulse 65   Resp 18   Ht 1.753 m (5\' 9" )   Wt 79.8 kg   SpO2 96%   BMI 25.99 kg/m  Physical Exam Vitals and nursing note reviewed.  Constitutional:      General: He is not in acute distress.    Appearance: He is well-developed.  HENT:     Head: Normocephalic and atraumatic.     Right Ear: External ear normal.     Left Ear: External ear normal.  Eyes:     General: No visual field deficit or scleral icterus.       Right eye: No discharge.        Left eye: No discharge.     Conjunctiva/sclera: Conjunctivae normal.  Neck:     Trachea: No tracheal deviation.   Cardiovascular:     Rate and Rhythm: Normal rate and regular rhythm.  Pulmonary:     Effort: Pulmonary effort is normal. No respiratory distress.     Breath sounds: Normal breath sounds. No stridor. No wheezing or rales.  Abdominal:     General: Bowel sounds are normal. There is no distension.     Palpations: Abdomen is soft.     Tenderness: There is no abdominal tenderness. There is no guarding or rebound.  Musculoskeletal:        General: No tenderness or deformity.     Cervical back: Neck supple.  Skin:    General: Skin is warm and dry.     Findings: No rash.  Neurological:     General: No focal deficit present.     Mental Status: He is alert and oriented to person, place, and time.     Cranial Nerves: No cranial nerve deficit, dysarthria or facial asymmetry.     Sensory: No sensory deficit.     Motor: No abnormal muscle tone, seizure activity or pronator drift.     Coordination: Coordination normal.     Comments:  able to hold both legs off bed for 5 seconds, sensation intact in all extremities,  no left or right sided neglect,  no nystagmus noted   Psychiatric:        Mood and Affect: Mood normal.     ED Results / Procedures / Treatments   Labs (all labs ordered are listed, but only abnormal results are displayed) Labs Reviewed  BASIC METABOLIC PANEL - Abnormal; Notable for the following components:      Result Value   Potassium 2.9 (*)    Glucose, Bld 128 (*)    Calcium 7.7 (*)    All other components within normal limits  CBC WITH DIFFERENTIAL/PLATELET - Abnormal; Notable for the following components:   RBC 4.03 (*)    MCH 34.2 (*)    All other components within normal limits  ETHANOL - Abnormal; Notable for the following components:   Alcohol, Ethyl (B) 163 (*)    All other components within normal limits  CBG MONITORING, ED - Abnormal; Notable for the following components:   Glucose-Capillary 140 (*)    All other components within normal limits     EKG EKG Interpretation Date/Time:  Tuesday October 17 2023 18:38:33 EST Ventricular Rate:  68 PR Interval:  177 QRS Duration:  102 QT Interval:  418 QTC Calculation: 445 R Axis:   77  Text Interpretation: Sinus rhythm Confirmed by Linwood Dibbles [phone]) on 10/17/2023  6:39:37 PM  Radiology No results found.  Procedures Procedures    Medications Ordered in ED Medications  potassium chloride SA (KLOR-CON M) CR tablet 40 mEq (40 mEq Oral Given 10/17/23 2022)    ED Course/ Medical Decision Making/ A&P Clinical Course as of 10/17/23 2031  Tue Oct 17, 2023  2009 Alcohol level elevated at 163.  Metabolic panel decreased to 2.9.  CBC normal. [JK]    Clinical Course User Index [JK] Linwood Dibbles, MD                                 Medical Decision Making Problems Addressed: Alcoholic intoxication with complication [facility]): acute illness or injury that poses a threat to life or bodily functions Hypokalemia: acute illness or injury that poses a threat to life or bodily functions Syncope, unspecified syncope type: acute illness or injury that poses a threat to life or bodily functions  Amount and/or Complexity of Data Reviewed Labs: ordered. Decision-making details documented in ED Course. ECG/medicine tests: ordered.  Risk Prescription drug management.   Presented to the ED for evaluation after syncopal episode.  Patient had been drinking beer earlier today.  Patient had seen his primary care doctor recently and was also started on hydrochlorothiazide.  Patient is ED workup reassuring.  Vital signs normal.  No cardiac dysrhythmia noted.  Patient's potassium level is decreased to 2.9.  He was given potassium replacement in the ED.  I suspect this is related to his recent diuretic prescription.  Will have him take supplemental potassium and have him follow-up with his doctor to have that rechecked.  Patient's alcohol level was elevated today.  He is alert and oriented however this  may have contributed to this syncopal episode he experienced.  Will have patient continue to monitor his blood pressure closely.  Follow-up with his doctor next week to be rechecked.  Warning signs precautions discussed.        Final Clinical Impression(s) / ED Diagnoses Final diagnoses:  Hypokalemia  Syncope, unspecified syncope type  Alcoholic intoxication with complication (HCC)    Rx / DC Orders ED Discharge Orders          Ordered    potassium chloride SA (KLOR-CON M) 20 MEQ tablet  2 times daily        10/17/23 2028              Linwood Dibbles, MD 10/17/23 2031

## 2023-10-17 NOTE — Discharge Instructions (Addendum)
It is possible your new blood pressure medication contributed to an episode of low blood pressure and the low potassium level we noted today.  It is also possible that the alcohol interacted with your medications.  Start taking the potassium supplements as prescribed.  Continue to monitor your blood pressure closely.  Follow-up with your doctor to be rechecked.  Return to the ER for any recurrent episodes.

## 2023-10-17 NOTE — ED Triage Notes (Signed)
Per EMS, Pt, from a bar, had a syncopal episode.  Denies pain.  Denies n/v/d.  Pt reports he was starting his 2nd beer when he passed out.  Pt did not fall, but placed head on table/bar.   Pt is A&Ox4.

## 2023-10-17 NOTE — ED Notes (Signed)
ED Provider at bedside. 

## 2023-10-18 ENCOUNTER — Telehealth: Payer: Self-pay

## 2023-10-18 NOTE — Transitions of Care (Post Inpatient/ED Visit) (Signed)
   10/18/2023  Name: Walter Cross MRN: [id number] DOB: 07-12-1951  Today's TOC FU Call Status: Today's TOC FU Call Status:: Unsuccessful Call (1st Attempt) Unsuccessful Call (1st Attempt) Date: 10/18/23  Attempted to reach the patient regarding the most recent Inpatient/ED visit.  Follow Up Plan: Additional outreach attempts will be made to reach the patient to complete the Transitions of Care (Post Inpatient/ED visit) call.   Signature Karena Addison, LPN [facility] Nurse Health Advisor Direct Dial [phone]

## 2023-10-18 NOTE — Transitions of Care (Post Inpatient/ED Visit) (Signed)
   10/18/2023  Name: TEANDRE MCCRACKEN MRN: [id number] DOB: 1951-11-04  Today's TOC FU Call Status: Today's TOC FU Call Status:: Successful TOC FU Call Completed Unsuccessful Call (1st Attempt) Date: 10/18/23 [facility] FU Call Complete Date: 10/18/23 Patient's Name and Date of Birth confirmed.  Transition Care Management Follow-up Telephone Call Date of Discharge: 10/17/23 Discharge Facility: [facility]) Type of Discharge: Emergency Department Reason for ED Visit: Other: (syncope) How have you been since you were released from the hospital?: Better Any questions or concerns?: No  Items Reviewed: Did you receive and understand the discharge instructions provided?: Yes Medications obtained,verified, and reconciled?: Yes (Medications Reviewed) Any new allergies since your discharge?: No Dietary orders reviewed?: NA Do you have support at home?: No  Medications Reviewed Today: Medications Reviewed Today   Medications were not reviewed in this encounter     Home Care and Equipment/Supplies: Were Home Health Services Ordered?: NA Any new equipment or medical supplies ordered?: NA  Functional Questionnaire: Do you need assistance with bathing/showering or dressing?: No Do you need assistance with meal preparation?: No Do you need assistance with eating?: No Do you have difficulty maintaining continence: No Do you need assistance with getting out of bed/getting out of a chair/moving?: No Do you have difficulty managing or taking your medications?: No  Follow up appointments reviewed: PCP Follow-up appointment confirmed?: No (no avail appt. sent message to staff to schedule) MD Provider Line [phone] Given: No Specialist Hospital Follow-up appointment confirmed?: NA Do you need transportation to your follow-up appointment?: No Do you understand care options if your condition(s) worsen?: Yes-patient verbalized understanding    SIGNATURE low grade fever

## 2023-10-19 ENCOUNTER — Telehealth: Payer: Self-pay

## 2023-10-19 NOTE — Telephone Encounter (Signed)
From Dr. Tanya Nones: Schedule with amber next week to recheck bp and potassium.  Hold hydrochlorothiazide.   I have scheduled him for 11/25 @ 3:45. I left a message for him to call the office to advise and sent a my chart message. Thank you ma'am.

## 2023-10-20 ENCOUNTER — Telehealth: Payer: Self-pay

## 2023-10-20 NOTE — Telephone Encounter (Signed)
   Pre-operative Risk Assessment    Patient Name: Walter Cross  DOB: 06/01/1951 MRN: [id number]     Request for Surgical Clearance    Procedure:  Colonoscopy/ endoscopy   Date of Surgery:  Clearance 11/07/23                                 Surgeon:  Dr. Bernette Redbird Surgeon's Group or Practice Name:  [facility] Phone number:  [phone] Fax number:  [phone]   Type of Clearance Requested:   - Pharmacy:  Hold Clopidogrel (Plavix)   5 days prior    Type of Anesthesia:     Additional requests/questions:    Scarlette Shorts   10/20/2023, 12:17 PM

## 2023-10-23 ENCOUNTER — Ambulatory Visit (INDEPENDENT_AMBULATORY_CARE_PROVIDER_SITE_OTHER): Payer: PPO | Admitting: Family Medicine

## 2023-10-23 ENCOUNTER — Encounter: Payer: Self-pay | Admitting: Family Medicine

## 2023-10-23 VITALS — BP 120/82 | HR 81 | Temp 98.4°F | Ht 69.0 in | Wt 175.0 lb

## 2023-10-23 DIAGNOSIS — E876 Hypokalemia: Secondary | ICD-10-CM

## 2023-10-23 DIAGNOSIS — Z23 Encounter for immunization: Secondary | ICD-10-CM | POA: Diagnosis not present

## 2023-10-23 NOTE — Addendum Note (Signed)
Addended by: Arta Silence on: 10/23/2023 04:40 PM   Modules accepted: Orders

## 2023-10-23 NOTE — Progress Notes (Signed)
Subjective:  HPI: Walter Cross is a 72 y.o. male presenting on 10/23/2023 for No chief complaint on file.   HPI Patient is in today for blood pressure and potassium recheck. He was seen in ED 6 days ago for syncopal episode secondary to hypokalemia. EKG NSR. Has been taking potassium chloride 20 mEq BID and discontinued the HCTZ. No further syncopal events, no dizziness/lightheadedness, chest pain, shortness of breath.  Review of Systems  All other systems reviewed and are negative.   Relevant past medical history reviewed and updated as indicated.   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 Echo 2007: mod LVH, diast dysfn, mild MR, no LVOT obstruction;  b. echo 9/13: upper septal thickening, no SAM of MV, no LVOT gradient, mild LVH, EF 65%   TIA (transient ischemic attack)    Vitamin D deficiency      Past Surgical History:  Procedure Laterality Date   CERVICAL SPINE SURGERY  1992-1993   nerve release, Dr Gerlene Fee   SPINE SURGERY     L4-L5, [phone]    Allergies and medications reviewed and updated.   Current Outpatient Medications:    acyclovir ointment (ZOVIRAX) 5 %, Apply 1 application topically daily as needed (cold sores & herpes)., Disp: 15 g, Rfl: 1   atorvastatin (LIPITOR) 40 MG tablet, TAKE 1 TABLET(40 MG) BY MOUTH DAILY, Disp: 90 tablet, Rfl: 0   buPROPion (WELLBUTRIN XL) 150 MG 24 hr tablet, Take 1 tablet (150 mg total) by mouth daily., Disp: 30 tablet, Rfl: 1   carvedilol (COREG) 12.5 MG tablet, TAKE 1 TABLET(12.5 MG) BY MOUTH TWICE DAILY WITH A MEAL, Disp: 180  tablet, Rfl: 0   Cholecalciferol (VITAMIN D) 50 MCG (2000 UT) CAPS, Take 1 capsule by mouth daily in the afternoon., Disp: , Rfl:    clonazePAM (KLONOPIN) 0.5 MG tablet, TAKE 1 TABLET(0.5 MG) BY MOUTH TWICE DAILY AS NEEDED FOR ANXIETY, Disp: 30 tablet, Rfl: 1   clopidogrel (PLAVIX) 75 MG tablet, TAKE 1 TABLET(75 MG) BY MOUTH DAILY, Disp: 90 tablet, Rfl: 1   diltiazem (CARDIZEM CD) 180 MG 24 hr capsule, TAKE 1 CAPSULE(180 MG) BY MOUTH DAILY, Disp: 90 capsule, Rfl: 0   folic acid (FOLVITE) 1 MG tablet, TAKE 2 TABLETS(2 MG) BY MOUTH DAILY, Disp: 180 tablet, Rfl: 0   ramipril (ALTACE) 10 MG capsule, TAKE 1 CAPSULE(10 MG) BY MOUTH DAILY, Disp: 90 capsule, Rfl: 3   sildenafil (REVATIO) 20 MG tablet, 3-5 tablets daily as needed, Disp: 50 tablet, Rfl: 6   tamsulosin (FLOMAX) 0.4 MG CAPS capsule, TAKE 1 CAPSULE(0.4 MG) BY MOUTH DAILY, Disp: 90 capsule, Rfl: 3   vortioxetine HBr (TRINTELLIX) 10 MG TABS tablet, Take 1 tablet (10 mg total) by mouth daily., Disp: 90 tablet, Rfl: 3   hydrochlorothiazide (HYDRODIURIL) 25 MG tablet, Take 1 tablet (25 mg total) by mouth daily., Disp: 90 tablet, Rfl: 3   pantoprazole (PROTONIX) 40 MG tablet, Take 1 tablet (40 mg total) by mouth  daily., Disp: 90 tablet, Rfl: 1   potassium chloride SA (KLOR-CON M) 20 MEQ tablet, Take 1 tablet (20 mEq total) by mouth 2 (two) times daily for 5 days., Disp: 10 tablet, Rfl: 0  Allergies  Allergen Reactions   Sulfa Antibiotics     Pt reports causes aches all over   Esomeprazole Magnesium Other (See Comments)    cramps   Septra [Bactrim] Other (See Comments)    Back ache   Sulfamethoxazole-Trimethoprim     Other reaction(s): Other (See Comments) Back ache Back ache Back ache     Objective:   BP 120/82 (BP Location: Left Arm)   Pulse 81   Temp 98.4 F (36.9 C)   Ht 5\' 9"  (1.753 m)   Wt 175 lb (79.4 kg)   SpO2 97%   BMI 25.84 kg/m      10/23/2023    3:41 PM 10/17/2023    8:26 PM 10/17/2023    7:00 PM  Vitals with  BMI  Height 5\' 9"     Weight 175 lbs    BMI 25.83    Systolic 120 127 161  Diastolic 82 68 65  Pulse 81 65 65     Physical Exam Vitals and nursing note reviewed.  Constitutional:      Appearance: Normal appearance. He is normal weight.  HENT:     Head: Normocephalic and atraumatic.  Cardiovascular:     Rate and Rhythm: Normal rate and regular rhythm.     Pulses: Normal pulses.     Heart sounds: Normal heart sounds.  Pulmonary:     Effort: Pulmonary effort is normal.     Breath sounds: Normal breath sounds.  Skin:    General: Skin is warm and dry.     Capillary Refill: Capillary refill takes less than 2 seconds.  Neurological:     General: No focal deficit present.     Mental Status: He is alert and oriented to person, place, and time. Mental status is at baseline.  Psychiatric:        Mood and Affect: Mood normal.        Behavior: Behavior normal.        Thought Content: Thought content normal.        Judgment: Judgment normal.     Assessment & Plan:  Hypokalemia Assessment & Plan: BP 120/82 today in office. Continue to hold hydrochlorothiazide. Will obtain CMP and determine if he needs continued potassium replacement. Return to PCP in 2-4 weeks for BP follow up or sooner if needed. Seek medical care immediately for chest pain, shortness of breath, or recurrent syncope.  Orders: -     COMPLETE METABOLIC PANEL WITH GFR     Follow up plan: Return in about 2 weeks (around 11/06/2023) for follow-up.  Park Meo, FNP

## 2023-10-23 NOTE — Telephone Encounter (Signed)
   Patient Name: Walter Cross  DOB: 01-19-1951 MRN: [id number]  Primary Cardiologist: None  Chart reviewed as part of pre-operative protocol coverage.   -We do not prescribe the patient's Plavix.  He takes this for his CVA.  Please contact prescribing provider.  Thanks.  Will route this bundled recommendation to requesting provider via Epic fax function and remove from pre-op pool. Please call with questions.  Sharlene Dory, PA-C 10/23/2023, 7:33 AM

## 2023-10-23 NOTE — Assessment & Plan Note (Addendum)
BP 120/82 today in office. Continue to hold hydrochlorothiazide. Will obtain CMP and determine if he needs continued potassium replacement. Return to PCP in 2-4 weeks for BP follow up or sooner if needed. Seek medical care immediately for chest pain, shortness of breath, or recurrent syncope.

## 2023-10-24 DIAGNOSIS — D492 Neoplasm of unspecified behavior of bone, soft tissue, and skin: Secondary | ICD-10-CM | POA: Diagnosis not present

## 2023-10-24 DIAGNOSIS — D225 Melanocytic nevi of trunk: Secondary | ICD-10-CM | POA: Diagnosis not present

## 2023-10-24 DIAGNOSIS — Z7189 Other specified counseling: Secondary | ICD-10-CM | POA: Diagnosis not present

## 2023-10-24 DIAGNOSIS — L814 Other melanin hyperpigmentation: Secondary | ICD-10-CM | POA: Diagnosis not present

## 2023-10-24 DIAGNOSIS — L718 Other rosacea: Secondary | ICD-10-CM | POA: Diagnosis not present

## 2023-10-24 DIAGNOSIS — L218 Other seborrheic dermatitis: Secondary | ICD-10-CM | POA: Diagnosis not present

## 2023-10-24 DIAGNOSIS — L821 Other seborrheic keratosis: Secondary | ICD-10-CM | POA: Diagnosis not present

## 2023-10-24 LAB — COMPLETE METABOLIC PANEL WITH GFR
AG Ratio: 2 (calc) (ref 1.0–2.5)
ALT: 12 U/L (ref 9–46)
AST: 18 U/L (ref 10–35)
Albumin: 4.3 g/dL (ref 3.6–5.1)
Alkaline phosphatase (APISO): 75 U/L (ref 35–144)
BUN: 11 mg/dL (ref 7–25)
CO2: 27 mmol/L (ref 20–32)
Calcium: 8.6 mg/dL (ref 8.6–10.3)
Chloride: 101 mmol/L (ref 98–110)
Creat: 0.91 mg/dL (ref 0.70–1.28)
Globulin: 2.2 g/dL (ref 1.9–3.7)
Glucose, Bld: 108 mg/dL — ABNORMAL HIGH (ref 65–99)
Potassium: 4.7 mmol/L (ref 3.5–5.3)
Sodium: 139 mmol/L (ref 135–146)
Total Bilirubin: 1 mg/dL (ref 0.2–1.2)
Total Protein: 6.5 g/dL (ref 6.1–8.1)
eGFR: 89 mL/min/{1.73_m2} (ref >=60)

## 2023-10-25 ENCOUNTER — Other Ambulatory Visit: Payer: Self-pay | Admitting: Family Medicine

## 2023-10-25 NOTE — Telephone Encounter (Signed)
Reordered 01/19/23 #90 3 RF  Requested Prescriptions  Refused Prescriptions Disp Refills   tamsulosin (FLOMAX) 0.4 MG CAPS capsule [Pharmacy Med Name: TAMSULOSIN 0.4MG  CAPSULES] 90 capsule 3    Sig: TAKE 1 CAPSULE(0.4 MG) BY MOUTH DAILY     Urology: Alpha-Adrenergic Blocker Failed - 10/25/2023  8:03 AM      Failed - PSA in normal range and within 360 days    PSA  Date Value Ref Range Status  04/04/2022 0.51 < OR = 4.00 ng/mL Final    Comment:    The total PSA value from this assay system is  standardized against the WHO standard. The test  result will be approximately 20% lower when compared  to the equimolar-standardized total PSA (Beckman  Coulter). Comparison of serial PSA results should be  interpreted with this fact in mind. . This test was performed using the Siemens  chemiluminescent method. Values obtained from  different assay methods cannot be used interchangeably. PSA levels, regardless of value, should not be interpreted as absolute evidence of the presence or absence of disease.          Failed - Valid encounter within last 12 months    Recent Outpatient Visits           1 year ago History of TIA (transient ischemic attack)   [facility] Pickard, Priscille Heidelberg, MD   2 years ago Long term (current) use of anticoagulants   [facility] Valentino Nose, NP   2 years ago History of TIA (transient ischemic attack)   [facility] Pickard, Priscille Heidelberg, MD   4 years ago General medical exam   [facility] Donita Brooks, MD   5 years ago General medical exam   [facility] Pickard, Priscille Heidelberg, MD       Future Appointments             In 1 week Perlie Gold, PA-C [place] [facility]            Passed - Last BP in normal range    BP Readings from Last 1 Encounters:  10/23/23 120/82

## 2023-10-30 ENCOUNTER — Other Ambulatory Visit: Payer: Self-pay | Admitting: Family Medicine

## 2023-11-01 NOTE — Telephone Encounter (Signed)
Last OV 10/09/23 Requested Prescriptions  Pending Prescriptions Disp Refills   ramipril (ALTACE) 10 MG capsule [Pharmacy Med Name: RAMIPRIL 10MG  CAPSULES] 90 capsule 3    Sig: TAKE 1 CAPSULE(10 MG) BY MOUTH DAILY     Cardiovascular:  ACE Inhibitors Failed - 10/30/2023  8:03 AM      Failed - Valid encounter within last 6 months    Recent Outpatient Visits           1 year ago History of TIA (transient ischemic attack)   [facility] Donita Brooks, MD   2 years ago Long term (current) use of anticoagulants   [facility] Valentino Nose, NP   2 years ago History of TIA (transient ischemic attack)   [facility] Pickard, Priscille Heidelberg, MD   4 years ago General medical exam   [facility] Donita Brooks, MD   5 years ago General medical exam   [facility] Donita Brooks, MD       Future Appointments             In 5 days Perlie Gold, PA-C [place] [facility] at [facility], [facility]            Passed - Cr in normal range and within 180 days    Creat  Date Value Ref Range Status  10/23/2023 0.91 0.70 - 1.28 mg/dL Final         Passed - K in normal range and within 180 days    Potassium  Date Value Ref Range Status  10/23/2023 4.7 3.5 - 5.3 mmol/L Final         Passed - Patient is not pregnant      Passed - Last BP in normal range    BP Readings from Last 1 Encounters:  10/23/23 120/82

## 2023-11-06 ENCOUNTER — Ambulatory Visit: Payer: PPO | Attending: Cardiology | Admitting: Cardiology

## 2023-11-06 VITALS — BP 148/74 | HR 80 | Ht 69.0 in | Wt 176.0 lb

## 2023-11-06 DIAGNOSIS — E782 Mixed hyperlipidemia: Secondary | ICD-10-CM | POA: Diagnosis not present

## 2023-11-06 DIAGNOSIS — G459 Transient cerebral ischemic attack, unspecified: Secondary | ICD-10-CM

## 2023-11-06 DIAGNOSIS — I10 Essential (primary) hypertension: Secondary | ICD-10-CM | POA: Diagnosis not present

## 2023-11-06 DIAGNOSIS — I517 Cardiomegaly: Secondary | ICD-10-CM | POA: Diagnosis not present

## 2023-11-06 MED ORDER — DILTIAZEM HCL ER COATED BEADS 240 MG PO CP24: 240.0000 mg | ORAL_CAPSULE | Freq: Every day | ORAL | 3 refills | Status: DC

## 2023-11-06 NOTE — Patient Instructions (Signed)
Medication Instructions:  STOP Hydrochlorothiazide STOP Potassium  INCREASE Diltiazem to 240mg  Take 1 tablet once a day  *If you need a refill on your cardiac medications before your next appointment, please call your pharmacy*   Lab Work: None ordered   Testing/Procedures: None Ordered   Follow-Up: At [facility], you and your health needs are our priority.  As part of our continuing mission to provide you with exceptional heart care, we have created designated Provider Care Teams.  These Care Teams include your primary Cardiologist (physician) and Advanced Practice Providers (APPs -  Physician Assistants and Nurse Practitioners) who all work together to provide you with the care you need, when you need it.  We recommend signing up for the patient portal called "MyChart".  Sign up information is provided on this After Visit Summary.  MyChart is used to connect with patients for Virtual Visits (Telemedicine).  Patients are able to view lab/test results, encounter notes, upcoming appointments, etc.  Non-urgent messages can be sent to your provider as well.   To learn more about what you can do with MyChart, go to [email or web address].    Your next appointment:   2-3 month(s)  Provider:   Perlie Gold, PA-C       NEEDS APPT WITH PHARM-D TO DISCUSS HYPERTENSION Other Instructions

## 2023-11-06 NOTE — Progress Notes (Signed)
Cardiology Office Note:   Date:  11/06/2023  ID:  Walter Cross, DOB February 25, 1951, MRN [id number] PCP: Donita Brooks, MD  [facility] Providers Cardiologist:  None    History of Present Illness:   Discussed the use of AI scribe software for clinical note transcription with the patient, who gave verbal consent to proceed.  History of Present Illness   The patient is a 72 year old individual with a history of hypertension and left ventricular hypertrophy. The patient reports feeling generally well, with one recent issue related to blood pressure management. He was started on a new antihypertensive medication by Dr. Tanya Nones, hydrochlorothiazide due to significant hypertension noted in clinic. However, after just a few doses, patient experienced an episode of syncope prompting ED evaluation This episode lasted approximately 10-15 minutes and required paramedic intervention. ED evaluation noted hypokalemia. Hydrochlorothiazide was discontinued and patient started on short course of K supplementation. Repeat labs following these changes show normalization of K.   The patient's current antihypertensive regimen includes ramipril, carvedilol, and Cardizem. He reports that his blood pressure readings at home have been in the 170s, occasionally reaching the 180s, which he acknowledges is high. He also notes that he has been taking these readings after dinner, close to bedtime. The patient has not experienced any chest pain, shortness of breath, or palpitations. He reports no swelling in the legs but has been experiencing daily afternoon headaches, which he believes may be related to his blood pressure.  The patient admits to a lack of regular exercise and reports eating out approximately once a week. He denies consuming a lot of salty foods. He was previously on potassium supplements for a short period, but these have been discontinued as noted above. The patient is also on atorvastatin for  cholesterol management, with recent labs showing excellent control. He has a history of transient ischemic attack and is currently on Plavix for secondary prevention.      Studies Reviewed:    EKG:      Recent ED ECG reviewed. Sinus rhythm without acute/focal changes.    Risk Assessment/Calculations:     HYPERTENSION CONTROL Vitals:   11/06/23 1509 11/06/23 1523 11/06/23 1604  BP: (!) 160/88 (!) 168/88 (!) 148/74    The patient's blood pressure is elevated above target today.  In order to address the patient's elevated BP: A current anti-hypertensive medication was adjusted today.; A referral to the [facility] will be placed.           Physical Exam:   VS:  BP (!) 148/74   Pulse 80   Ht 5\' 9"  (1.753 m)   Wt 176 lb (79.8 kg)   SpO2 97%   BMI 25.99 kg/m    Wt Readings from Last 3 Encounters:  11/06/23 176 lb (79.8 kg)  10/23/23 175 lb (79.4 kg)  10/17/23 176 lb (79.8 kg)     Physical Exam Vitals reviewed.  Constitutional:      Appearance: Normal appearance.  HENT:     Head: Normocephalic.  Eyes:     Pupils: Pupils are equal, round, and reactive to light.  Cardiovascular:     Rate and Rhythm: Normal rate and regular rhythm.     Pulses: Normal pulses.     Heart sounds: Normal heart sounds.  Pulmonary:     Effort: Pulmonary effort is normal.     Breath sounds: Normal breath sounds.  Musculoskeletal:     Right lower leg: No edema.     Left  lower leg: No edema.  Skin:    General: Skin is warm and dry.     Capillary Refill: Capillary refill takes less than 2 seconds.  Neurological:     General: No focal deficit present.     Mental Status: He is alert and oriented to person, place, and time.  Psychiatric:        Mood and Affect: Mood normal.        Behavior: Behavior normal.        Thought Content: Thought content normal.        Judgment: Judgment normal.     ASSESSMENT AND PLAN:     Assessment and Plan    Hypertension Hypertension  with recent home readings in the 150s-170s range, occasionally higher. Best office blood pressure was 148/74. Recent intolerance to hydrochlorothiazide due to hypokalemia. Current regimen includes ramipril, carvedilol, and Cardizem.   - Increase diltiazem to 240 mg daily - Refer to clinical pharmacist for comprehensive medication review and blood pressure management given current atypical regimen, recent hypokalemia on HCTZ, and need for regimen simplification with some noted memory loss by PCP - Instruct to bring home blood pressure cuff to the clinical pharmacist appointment for calibration - Monitor blood pressure and heart rate at home, especially after medication adjustments - Follow up in 1-2 months to reassess blood pressure control  Cerebrovascular Disease TIA and cerebrovascular disease. Currently on Plavix. - Continue Plavix as prescribed by PCP  Hyperlipidemia Hyperlipidemia well-controlled with atorvastatin 40 mg daily. Recent LDL was 34. - Continue atorvastatin 40 mg daily  General Health Maintenance No significant changes in physical activity tolerance, no chest pain, and no palpitations. No recent episodes of syncope since stopping hydrochlorothiazide. - Encourage regular physical activity - Advise on low-sodium diet - Reinforce importance of medication adherence  Follow-up - Schedule follow-up appointment with clinical pharmacist for blood pressure management - Contact Dr. Caren Macadam office to schedule follow-up as recommended - Plan to see cardiology in a couple of months to reassess blood pressure control.            Signed, Perlie Gold, PA-C

## 2023-11-07 ENCOUNTER — Other Ambulatory Visit: Payer: Self-pay | Admitting: Family Medicine

## 2023-11-07 DIAGNOSIS — D123 Benign neoplasm of transverse colon: Secondary | ICD-10-CM | POA: Diagnosis not present

## 2023-11-07 DIAGNOSIS — Z860101 Personal history of adenomatous and serrated colon polyps: Secondary | ICD-10-CM | POA: Diagnosis not present

## 2023-11-07 DIAGNOSIS — D128 Benign neoplasm of rectum: Secondary | ICD-10-CM | POA: Diagnosis not present

## 2023-11-07 DIAGNOSIS — K5730 Diverticulosis of large intestine without perforation or abscess without bleeding: Secondary | ICD-10-CM | POA: Diagnosis not present

## 2023-11-07 DIAGNOSIS — D122 Benign neoplasm of ascending colon: Secondary | ICD-10-CM | POA: Diagnosis not present

## 2023-11-07 DIAGNOSIS — Z09 Encounter for follow-up examination after completed treatment for conditions other than malignant neoplasm: Secondary | ICD-10-CM | POA: Diagnosis not present

## 2023-11-09 ENCOUNTER — Other Ambulatory Visit: Payer: Self-pay | Admitting: Family Medicine

## 2023-11-09 DIAGNOSIS — D128 Benign neoplasm of rectum: Secondary | ICD-10-CM | POA: Diagnosis not present

## 2023-11-09 DIAGNOSIS — D122 Benign neoplasm of ascending colon: Secondary | ICD-10-CM | POA: Diagnosis not present

## 2023-11-09 DIAGNOSIS — D123 Benign neoplasm of transverse colon: Secondary | ICD-10-CM | POA: Diagnosis not present

## 2023-11-16 ENCOUNTER — Other Ambulatory Visit: Payer: Self-pay | Admitting: Cardiovascular Disease

## 2023-11-27 ENCOUNTER — Other Ambulatory Visit: Payer: Self-pay | Admitting: Family Medicine

## 2023-11-27 ENCOUNTER — Other Ambulatory Visit: Payer: Self-pay | Admitting: Cardiovascular Disease

## 2023-12-03 ENCOUNTER — Other Ambulatory Visit: Payer: Self-pay | Admitting: Family Medicine

## 2023-12-03 DIAGNOSIS — F321 Major depressive disorder, single episode, moderate: Secondary | ICD-10-CM

## 2023-12-04 NOTE — Telephone Encounter (Signed)
 Requested medication (s) are due for refill today - yes  Requested medication (s) are on the active medication list -yes  Future visit scheduled -no  Last refill: 03/23/23 #30 1RF  Notes to clinic: non delegated Rx  Requested Prescriptions  Pending Prescriptions Disp Refills   clonazePAM  (KLONOPIN ) 0.5 MG tablet [Pharmacy Med Name: CLONAZEPAM  0.5MG  TABLETS] 30 tablet     Sig: TAKE 1 TABLET(0.5 MG) BY MOUTH TWICE DAILY AS NEEDED FOR ANXIETY     Not Delegated - Psychiatry: Anxiolytics/Hypnotics 2 Failed - 12/04/2023  9:37 AM      Failed - This refill cannot be delegated      Failed - Urine Drug Screen completed in last 360 days      Failed - Valid encounter within last 6 months    Recent Outpatient Visits           1 year ago History of TIA (transient ischemic attack)   [facility] Duanne Butler DASEN, MD   2 years ago Long term (current) use of anticoagulants   [facility] Chandra Harlene LABOR, NP   2 years ago History of TIA (transient ischemic attack)   [facility] Pickard, Butler DASEN, MD   4 years ago General medical exam   [facility] Pickard, Butler DASEN, MD   5 years ago General medical exam   [facility] Pickard, Butler DASEN, MD       Future Appointments             In 1 week  [place] [facility]   In 1 month Trudy Birmingham, PA-C [place] [facility] at [employer], [employer] - Patient is not pregnant         Requested Prescriptions  Pending Prescriptions Disp Refills   clonazePAM  (KLONOPIN ) 0.5 MG tablet [Pharmacy Med Name: CLONAZEPAM  0.5MG  TABLETS] 30 tablet     Sig: TAKE 1 TABLET(0.5 MG) BY MOUTH TWICE DAILY AS NEEDED FOR ANXIETY     Not Delegated - Psychiatry: Anxiolytics/Hypnotics 2 Failed - 12/04/2023  9:37 AM      Failed - This refill cannot be delegated      Failed - Urine Drug Screen completed in last 360 days       Failed - Valid encounter within last 6 months    Recent Outpatient Visits           1 year ago History of TIA (transient ischemic attack)   [facility] Duanne Butler DASEN, MD   2 years ago Long term (current) use of anticoagulants   [facility] Chandra Harlene LABOR, NP   2 years ago History of TIA (transient ischemic attack)   [facility] Pickard, Butler DASEN, MD   4 years ago General medical exam   [facility] Duanne Butler DASEN, MD   5 years ago General medical exam   [facility] Pickard, Butler DASEN, MD       Future Appointments             In 1 week  [place] [facility]   In 1 month Trudy Birmingham, PA-C [place] [facility] at [facility], [employer] - Patient is not pregnant

## 2023-12-11 ENCOUNTER — Encounter: Payer: Self-pay | Admitting: Family Medicine

## 2023-12-12 ENCOUNTER — Ambulatory Visit: Payer: PPO | Attending: Cardiology | Admitting: Pharmacist

## 2023-12-12 ENCOUNTER — Other Ambulatory Visit: Payer: Self-pay | Admitting: Family Medicine

## 2023-12-12 VITALS — BP 132/70 | HR 71

## 2023-12-12 DIAGNOSIS — I10 Essential (primary) hypertension: Secondary | ICD-10-CM

## 2023-12-12 DIAGNOSIS — F321 Major depressive disorder, single episode, moderate: Secondary | ICD-10-CM

## 2023-12-12 MED ORDER — CLONAZEPAM 0.5 MG PO TABS: 0.5000 mg | ORAL_TABLET | Freq: Two times a day (BID) | ORAL | 1 refills | Status: DC | PRN

## 2023-12-12 NOTE — Assessment & Plan Note (Signed)
 Assessment: Blood pressure readings in clinic versus home do not correlate Patient did not bring home blood pressure cuff for validation today Pressure in clinic is close to goal.  Checked in both left and right arm which were similar Drinks 2-3 beers nightly Very little exercise  Plan: No medication changes today.  Continue carvedilol  12.5mg  twice a day, diltiazem  240mg  daily, ramipril  10mg  daily I have asked him to purchase a new blood pressure cuff and bring this in along with his blood pressure readings to next visit I have asked him to cut back on alcohol Increase exercise.  We talked about walking inside if it is too cold out.  Also discussed body weight exercises and banded exercises Follow-up in 2 to 3 weeks

## 2023-12-12 NOTE — Patient Instructions (Addendum)
 Your blood pressure goal is < 130/49mmHg   Please continue carvedilol  12.5mg  twice a day, diltiazem  240mg  daily, ramipril  10mg  daily  Please pick up a new blood pressure machine  Please bring in your blood pressure cuff and readings to next visit Please check blood pressure sometimes in AM and sometimes in PM  Cut back some on beer  Please call me at [phone] with any questions   Important lifestyle changes to control high blood pressure  Intervention  Effect on the BP   Weight loss Weight loss is one of the most effective lifestyle changes for controlling blood pressure. If you're overweight or obese, losing even a small amount of weight can help reduce blood pressure.    Blood pressure can decrease by 1 millimeter of mercury (mmHg) with each kilogram (about 2.2 pounds) of weight lost.   Exercise regularly As a general goal, aim for 30 minutes of moderate physical activity every day.    Regular physical activity can lower blood pressure by 5 - 8 mmHg.   Eat a healthy diet Eat a diet rich in whole grains, fruits, vegetables, lean meat, and low-fat dairy products. Limit processed foods, saturated fat, and sweets.    A heart-healthy diet can lower high blood pressure by 10 mmHg.   Reduce salt (sodium) in your diet Aim for 000mg  of sodium each day. Avoid deli meats, canned food, and frozen microwave meals which are high in sodium.     Limiting sodium can reduce blood pressure by 5 mmHg.   Limit alcohol One drink equals 12 ounces of beer, 5 ounces of wine, or 1.5 ounces of 80-proof liquor.    Limiting alcohol to < 1 drink a day for women or < 2 drinks a day for men can help lower blood pressure by about 4 mmHg.   To check your pressure at home you will need to:   Sit up in a chair, with feet flat on the floor and back supported. Do not cross your ankles or legs. Rest your left arm so that the cuff is about heart level. If the cuff goes on your upper arm, then just  relax your arm on the table, arm of the chair, or your lap. If you have a wrist cuff, hold your wrist against your chest at heart level. Place the cuff snugly around your arm, about 1 inch above the crease of your elbow. The cords should be inside the groove of your elbow.  Sit quietly, with the cuff in place, for about 5 minutes. Then press the power button to start a reading. Do not talk or move while the reading is taking place.  Record your readings on a sheet of paper. Although most cuffs have a memory, it is often easier to see a pattern developing when the numbers are all in front of you.  You can repeat the reading after 1-3 minutes if it is recommended.   Make sure your bladder is empty and you have not had caffeine or tobacco within the last 30 minutes   Always bring your blood pressure log with you to your appointments. If you have not brought your monitor in to be double checked for accuracy, please bring it to your next appointment.   You can find a list of validated (accurate) blood pressure cuffs at: validatebp.org

## 2023-12-12 NOTE — Progress Notes (Signed)
 Patient ID: Walter Cross                 DOB: 07-21-1951                      MRN: [id number]      HPI: Walter Cross is a 73 y.o. male referred by Walter Pouch, PA to HTN clinic. PMH is significant for HTN, HLD, TIA and LVH.   He was started on a new antihypertensive medication by Dr. Duanne, hydrochlorothiazide  due to significant hypertension noted in clinic. However, after just a few doses, patient experienced an episode of syncope prompting ED evaluation This episode lasted approximately 10-15 minutes and required paramedic intervention. ED evaluation noted hypokalemia. Hydrochlorothiazide  was discontinued and patient started on short course of K supplementation. Repeat labs following these changes show normalization of K.   Seen by Walter Cross 11/06/23. BP was 148/74 in clinic. Patient reported BP at home in the 150-170's in the evening. Diltiazem  was increased to 240mg .   Patient presents today to hypertension clinic.  He did not remember to bring his home blood pressure cuff or his list of readings.  He states that it has been running about 140-150/ 70s.  Heart rate 80s to 90s.  States his blood pressure cuff has been checked before and was usually within 10 points.  States his blood pressure has dropped about 10 points since increasing the dose of diltiazem .  Denies any dizziness, lightheadedness, headaches, blurred vision, swelling or shortness of breath.  He does very little exercise although states he made resolution to start walking more.  Denies any tobacco use.  Drinks 2-3 beers most nights.  Admits he previously smoked marijuana.  Does ask about THC Gummies which I recommended against.  He does describe good technique for checking blood pressure.  Tries to limit salt.  Does not salt food, knows how to read food label for sodium content.  Current HTN meds: carvedilol  12.5mg  twice a day, diltiazem  240mg  daily, ramipril  10mg  daily Previously tried: hydrochlorothiazide   (hypokalemia, syncope) BP goal: <130/80  Family History:  Family History  Problem Relation Age of Onset   Cancer Mother 86       cervical   Cancer Father        lung   Stroke Brother    Atrial fibrillation Other     Social History: 2-3 beers almost everyday, no tobacco  Diet: 1 cup half calf, tries to avoid salt  Exercise:  Very little  Home BP readings:  Did not bring in a list of readings reports 140s to 150s/70's HR 80-90 Blood pressure cuff is several years old.  Usually checking in the evening  Wt Readings from Last 3 Encounters:  11/06/23 176 lb (79.8 kg)  10/23/23 175 lb (79.4 kg)  10/17/23 176 lb (79.8 kg)   BP Readings from Last 3 Encounters:  12/12/23 132/70  11/06/23 (!) 148/74  10/23/23 120/82   Pulse Readings from Last 3 Encounters:  12/12/23 71  11/06/23 80  10/23/23 81    Renal function: CrCl cannot be calculated (Patient's most recent lab result is older than the maximum 21 days allowed.).  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 Echo 2007: mod LVH, diast dysfn, mild MR, no LVOT obstruction;  b. echo 9/13: upper septal thickening, no SAM of MV, no LVOT gradient, mild LVH, EF 65%   TIA (transient ischemic attack)    Vitamin D deficiency     Current Outpatient Medications on File Prior to Visit  Medication Sig Dispense Refill   acyclovir  ointment (ZOVIRAX ) 5 % Apply 1 application topically daily as needed (cold sores & herpes). 15 g 1   atorvastatin  (LIPITOR) 40 MG tablet TAKE 1 TABLET(40 MG) BY MOUTH DAILY 90 tablet 3   buPROPion  (WELLBUTRIN  XL) 150  MG 24 hr tablet TAKE 1 TABLET(150 MG) BY MOUTH DAILY 30 tablet 1   carvedilol  (COREG ) 12.5 MG tablet TAKE 1 TABLET(12.5 MG) BY MOUTH TWICE DAILY WITH A MEAL 180 tablet 3   Cholecalciferol (VITAMIN D) 50 MCG (2000 UT) CAPS Take 1 capsule by mouth daily in the afternoon.     clopidogrel  (PLAVIX ) 75 MG tablet TAKE 1 TABLET(75 MG) BY MOUTH DAILY 90 tablet 1   diltiazem  (CARDIZEM  CD) 240 MG 24 hr capsule Take 1 capsule (240 mg total) by mouth daily. 30 capsule 3   folic acid  (FOLVITE ) 1 MG tablet TAKE 2 TABLETS(2 MG) BY MOUTH DAILY 180 tablet 0   pantoprazole  (PROTONIX ) 40 MG tablet Take 1 tablet (40 mg total) by mouth daily. 90 tablet 1   ramipril  (ALTACE ) 10 MG capsule TAKE 1 CAPSULE(10 MG) BY MOUTH DAILY 90 capsule 1   sildenafil  (REVATIO ) 20 MG tablet 3-5 tablets daily as needed 50 tablet 6   tamsulosin  (FLOMAX ) 0.4 MG CAPS capsule TAKE 1 CAPSULE(0.4 MG) BY MOUTH DAILY 90 capsule 3   TRINTELLIX  10 MG TABS tablet TAKE 1 TABLET(10 MG) BY MOUTH DAILY 90 tablet 3   No current facility-administered medications on file prior to visit.    Allergies  Allergen Reactions   Sulfa Antibiotics     Pt reports causes aches all over   Esomeprazole Magnesium Other (See Comments)    cramps   Septra [Bactrim] Other (See Comments)    Back ache   Sulfamethoxazole-Trimethoprim     Other reaction(s): Other (See Comments) Back ache Back ache Back ache     Blood pressure 132/70, pulse 71.   Assessment/Plan:     1. Hypertension -  HTN (hypertension) Assessment: Blood pressure readings in clinic versus home do not correlate Patient did not bring home blood pressure cuff for validation today Pressure in clinic is close to goal.  Checked in both left and right arm which were similar Drinks 2-3 beers nightly Very little exercise  Plan: No medication changes today.  Continue carvedilol  12.5mg  twice a day, diltiazem  240mg  daily, ramipril  10mg  daily I have asked him to purchase a new blood pressure  cuff and bring this in along with his blood pressure readings to next visit I have asked him to cut back on alcohol Increase exercise.  We talked about walking inside if it is too cold out.  Also discussed body weight exercises and banded exercises Follow-up in 2 to 3 weeks   Thank you  Meer Reindl D Abdulkareem Badolato, Pharm.D, BCACP, CPP [facility] A Division of [facility] [address] [place], [place] [address]  Phone: [phone]; Fax: [phone]

## 2023-12-29 ENCOUNTER — Ambulatory Visit: Payer: PPO | Attending: Cardiology | Admitting: Pharmacist

## 2023-12-29 VITALS — BP 150/82 | HR 70

## 2023-12-29 DIAGNOSIS — I10 Essential (primary) hypertension: Secondary | ICD-10-CM | POA: Diagnosis not present

## 2023-12-29 NOTE — Assessment & Plan Note (Signed)
Assessment: Blood pressure above goal of less than 130/80 Patient has not made any lifestyle interventions Discussed addition of spironolactone however patient did not want to add any medications at this time  Plan: Patient refused to add a medication would prefer to work on lifestyle Continue carvedilol 12.5mg  twice a day, diltiazem 240mg  daily, ramipril 10mg  daily Start exercising.  Patient plans to walk. advised to work up to 150 minutes/week of moderate exercise Recommended patient decrease alcohol to no more than 2 drinks per day Follow-up in 6 weeks

## 2023-12-29 NOTE — Patient Instructions (Addendum)
Your blood pressure goal is < 130/32mmHg   Please increase your exercise Goal 150 min per week Continue carvedilol 12.5mg  twice a day, diltiazem 240mg  daily, ramipril 10mg  daily Decrease alcohol to no more than 2 drinks per day  Important lifestyle changes to control high blood pressure  Intervention  Effect on the BP   Weight loss Weight loss is one of the most effective lifestyle changes for controlling blood pressure. If you're overweight or obese, losing even a small amount of weight can help reduce blood pressure.    Blood pressure can decrease by 1 millimeter of mercury (mmHg) with each kilogram (about 2.2 pounds) of weight lost.   Exercise regularly As a general goal, aim for 30 minutes of moderate physical activity every day.    Regular physical activity can lower blood pressure by 5 - 8 mmHg.   Eat a healthy diet Eat a diet rich in whole grains, fruits, vegetables, lean meat, and low-fat dairy products. Limit processed foods, saturated fat, and sweets.    A heart-healthy diet can lower high blood pressure by 10 mmHg.   Reduce salt (sodium) in your diet Aim for 000mg  of sodium each day. Avoid deli meats, canned food, and frozen microwave meals which are high in sodium.     Limiting sodium can reduce blood pressure by 5 mmHg.   Limit alcohol One drink equals 12 ounces of beer, 5 ounces of wine, or 1.5 ounces of 80-proof liquor.    Limiting alcohol to < 1 drink a day for women or < 2 drinks a day for men can help lower blood pressure by about 4 mmHg.   To check your pressure at home you will need to:   Sit up in a chair, with feet flat on the floor and back supported. Do not cross your ankles or legs. Rest your left arm so that the cuff is about heart level. If the cuff goes on your upper arm, then just relax your arm on the table, arm of the chair, or your lap. If you have a wrist cuff, hold your wrist against your chest at heart level. Place the cuff snugly  around your arm, about 1 inch above the crease of your elbow. The cords should be inside the groove of your elbow.  Sit quietly, with the cuff in place, for about 5 minutes. Then press the power button to start a reading. Do not talk or move while the reading is taking place.  Record your readings on a sheet of paper. Although most cuffs have a memory, it is often easier to see a pattern developing when the numbers are all in front of you.  You can repeat the reading after 1-3 minutes if it is recommended.   Make sure your bladder is empty and you have not had caffeine or tobacco within the last 30 minutes   Always bring your blood pressure log with you to your appointments. If you have not brought your monitor in to be double checked for accuracy, please bring it to your next appointment.   You can find a list of validated (accurate) blood pressure cuffs at: validatebp.org

## 2023-12-29 NOTE — Progress Notes (Unsigned)
Patient ID: DATHAN ATTIA                 DOB: [birth year]                      MRN: [id number]      HPI: RAYYAN BURLEY is a 73 y.o. male referred by Perlie Gold, PA to HTN clinic. PMH is significant for HTN, HLD, TIA and LVH.   He was started on a new antihypertensive medication by Dr. Tanya Nones, hydrochlorothiazide due to significant hypertension noted in clinic. However, after just a few doses, patient experienced an episode of syncope prompting ED evaluation This episode lasted approximately 10-15 minutes and required paramedic intervention. ED evaluation noted hypokalemia. Hydrochlorothiazide was discontinued and patient started on short course of K supplementation. Repeat labs following these changes show normalization of K.   Seen by Perlie Gold 11/06/23. BP was 148/74 in clinic. Patient reported BP at home in the 150-170's in the evening. Diltiazem was increased to 240mg .   Patient seen in Pharm.D. clinic 12/12/2023.  His home blood pressures per memory were between 140 and 150 over 70s heart rate 80s to 90s.  Blood pressure in clinic that day 132/70.  Clinic readings and home readings do not correlate.  I asked him to purchase new blood pressure cuff and cut back on alcohol and increase his physical activity.  No medication changes were made that day.  Patient presents today to hypertension clinic.  He denies any dizziness lightheadedness headaches blurred vision or shortness of breath.  He states that he has not had a headache in a while used to get several week.  At last visit we discussed increasing exercise which patient has yet to do.  We also discussed decreasing alcohol, patient states that he has decreased alcohol intake however now reporting that he previously drank 4-5 beers and 1 glass of wine per night and now he is drinking 2-3 beers and 1 glass of wine per night.  Patient reports that he bought a new blood pressure cuff however he did not bring this with him.  He also  did not bring any blood pressure readings.  Reporting blood pressure at home 1 40-150 over 80s to 90s.  This does match my readings today in clinic.  He rests a good amount of time before checking his blood pressure.    Current HTN meds: carvedilol 12.5mg  twice a day, diltiazem 240mg  daily, ramipril 10mg  daily Previously tried: hydrochlorothiazide (hypokalemia, syncope) BP goal: <130/80  Family History:  Family History  Problem Relation Age of Onset   Cancer Mother 41       cervical   Cancer Father        lung   Stroke Brother    Atrial fibrillation Other     Social History: 2-3 beers almost everyday, no tobacco  Diet: 1 cup half calf, tries to avoid salt  Exercise:  Very little  Home BP readings:  Did not bring in a list of readings reports 140s to 150s/70's HR 80-90 Blood pressure cuff is several years old.  Usually checking in the evening  Wt Readings from Last 3 Encounters:  11/06/23 176 lb (79.8 kg)  10/23/23 175 lb (79.4 kg)  10/17/23 176 lb (79.8 kg)   BP Readings from Last 3 Encounters:  12/29/23 (!) 150/82  12/12/23 132/70  11/06/23 (!) 148/74   Pulse Readings from Last 3 Encounters:  12/29/23 70  12/12/23 71  11/06/23 80  Renal function: CrCl cannot be calculated (Patient's most recent lab result is older than the maximum 21 days allowed.).  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 Echo 2007: mod LVH, diast dysfn, mild MR, no LVOT obstruction;  b. echo 9/13: upper septal thickening, no SAM of MV, no LVOT gradient, mild LVH, EF 65%   TIA (transient ischemic  attack)    Vitamin D deficiency     Current Outpatient Medications on File Prior to Visit  Medication Sig Dispense Refill   carvedilol (COREG) 12.5 MG tablet TAKE 1 TABLET(12.5 MG) BY MOUTH TWICE DAILY WITH A MEAL 180 tablet 3   diltiazem (CARDIZEM CD) 240 MG 24 hr capsule Take 1 capsule (240 mg total) by mouth daily. 30 capsule 3   ramipril (ALTACE) 10 MG capsule TAKE 1 CAPSULE(10 MG) BY MOUTH DAILY 90 capsule 1   acyclovir ointment (ZOVIRAX) 5 % Apply 1 application topically daily as needed (cold sores & herpes). 15 g 1   atorvastatin (LIPITOR) 40 MG tablet TAKE 1 TABLET(40 MG) BY MOUTH DAILY 90 tablet 3   buPROPion (WELLBUTRIN XL) 150 MG 24 hr tablet TAKE 1 TABLET(150 MG) BY MOUTH DAILY 30 tablet 1   Cholecalciferol (VITAMIN D) 50 MCG (2000 UT) CAPS Take 1 capsule by mouth daily in the afternoon.     clonazePAM (KLONOPIN) 0.5 MG tablet Take 1 tablet (0.5 mg total) by mouth 2 (two) times daily as needed for anxiety. 30 tablet 1   clopidogrel (PLAVIX) 75 MG tablet TAKE 1 TABLET(75 MG) BY MOUTH DAILY 90 tablet 1   folic acid (FOLVITE) 1 MG tablet TAKE 2 TABLETS(2 MG) BY MOUTH DAILY 180 tablet 0   pantoprazole (PROTONIX) 40 MG tablet Take 1 tablet (40 mg total) by mouth daily. 90 tablet 1   sildenafil (REVATIO) 20 MG tablet 3-5 tablets daily as needed 50 tablet 6   tamsulosin (FLOMAX) 0.4 MG CAPS capsule TAKE 1 CAPSULE(0.4 MG) BY MOUTH DAILY 90 capsule 3   TRINTELLIX 10 MG TABS tablet TAKE 1 TABLET(10 MG) BY MOUTH DAILY 90 tablet 3   No current facility-administered medications on file prior to visit.    Allergies  Allergen Reactions   Sulfa Antibiotics     Pt reports causes aches all over   Esomeprazole Magnesium Other (See Comments)    cramps   Septra [Bactrim] Other (See Comments)    Back ache   Sulfamethoxazole-Trimethoprim     Other reaction(s): Other (See Comments) Back ache Back ache Back ache     Blood pressure (!) 150/82, pulse 70.   Assessment/Plan: HYPERTENSION  CONTROL Vitals:   12/29/23 1145 12/29/23 1146  BP: (!) 140/90 (!) 150/82    The patient's blood pressure is elevated above target today. {Click here if intervention needs to be changed Refresh Note :1}  In order to address the patient's elevated BP: Blood pressure will be monitored at home to determine if medication changes need to be made.; A referral to the  PharmD Hypertension Clinic will be placed.      1. Hypertension -  HTN (hypertension) Assessment: Blood pressure above goal of less than 130/80 Patient has not made any lifestyle interventions Discussed addition of spironolactone however patient did not want to add any medications at this time  Plan: Patient refused to add a medication would prefer to work on lifestyle Continue carvedilol 12.5mg  twice a day, diltiazem 240mg  daily, ramipril 10mg  daily Start exercising.  Patient plans to walk. advised to work up to 150 minutes/week of moderate exercise Recommended patient decrease alcohol to no more than 2 drinks per day Follow-up in 6 weeks    Thank you  Olene Floss, Pharm.D, BCACP, CPP  [facility] A Division of [facility] [address] [place], [place] [address]  Phone: [phone]; Fax: [phone]

## 2024-01-09 ENCOUNTER — Other Ambulatory Visit: Payer: PPO

## 2024-01-10 ENCOUNTER — Other Ambulatory Visit: Payer: PPO

## 2024-01-10 DIAGNOSIS — E538 Deficiency of other specified B group vitamins: Secondary | ICD-10-CM | POA: Diagnosis not present

## 2024-01-11 ENCOUNTER — Other Ambulatory Visit: Payer: Self-pay

## 2024-01-11 LAB — B12 AND FOLATE PANEL
Folate: 4.7 ng/mL — ABNORMAL LOW
Vitamin B-12: 375 pg/mL (ref 200–1100)

## 2024-01-25 ENCOUNTER — Encounter: Payer: Self-pay | Admitting: Cardiology

## 2024-01-25 ENCOUNTER — Ambulatory Visit: Payer: PPO | Attending: Cardiology | Admitting: Cardiology

## 2024-01-25 VITALS — BP 122/68 | HR 75 | Ht 69.0 in | Wt 168.4 lb

## 2024-01-25 DIAGNOSIS — G459 Transient cerebral ischemic attack, unspecified: Secondary | ICD-10-CM

## 2024-01-25 DIAGNOSIS — E782 Mixed hyperlipidemia: Secondary | ICD-10-CM | POA: Diagnosis not present

## 2024-01-25 DIAGNOSIS — I10 Essential (primary) hypertension: Secondary | ICD-10-CM | POA: Diagnosis not present

## 2024-01-25 DIAGNOSIS — I517 Cardiomegaly: Secondary | ICD-10-CM

## 2024-01-25 NOTE — Patient Instructions (Signed)
 Follow-Up: At [facility], you and your health needs are our priority.  As part of our continuing mission to provide you with exceptional heart care, we have created designated Provider Care Teams.  These Care Teams include your primary Cardiologist (physician) and Advanced Practice Providers (APPs -  Physician Assistants and Nurse Practitioners) who all work together to provide you with the care you need, when you need it.  We recommend signing up for the patient portal called "MyChart".  Sign up information is provided on this After Visit Summary.  MyChart is used to connect with patients for Virtual Visits (Telemedicine).  Patients are able to view lab/test results, encounter notes, upcoming appointments, etc.  Non-urgent messages can be sent to your provider as well.   To learn more about what you can do with MyChart, go to [email or web address].    Your next appointment:   6 month(s)  Provider:   Dr. Anne Fu      Other Instructions   1st Floor: - Lobby - Registration  - Pharmacy  - Lab - Cafe  2nd Floor: - PV Lab - Diagnostic Testing (echo, CT, nuclear med)  3rd Floor: - Vacant  4th Floor: - TCTS (cardiothoracic surgery) - AFib Clinic - Structural Heart Clinic - Vascular Surgery  - Vascular Ultrasound  5th Floor: - HeartCare Cardiology (general and EP) - Clinical Pharmacy for coumadin, hypertension, lipid, weight-loss medications, and med management appointments    Valet parking services will be available as well.

## 2024-01-25 NOTE — Progress Notes (Signed)
 Cardiology Office Note:   Date:  01/25/2024  ID:  Blake, Vetrano 02/27/1951, MRN [id number] PCP: Donita Brooks, MD  [facility] Providers Cardiologist:  None    History of Present Illness:   Discussed the use of AI scribe software for clinical note transcription with the patient, who gave verbal consent to proceed.  BROCK LARMON is a 73 year old male with hypertension and left ventricular hypertrophy who presents for cardiology follow-up and blood pressure management.  Last year, he was started on hydrochlorothiazide by his primary care provider due to significant hypertension, but experienced syncope after a few doses, leading to an emergency department visit where he was found to be hypokalemic. Hydrochlorothiazide was discontinued, and he was placed on a short course of potassium supplementation, which normalized his potassium levels. At the last visit in December, he was taking ramipril, carvedilol, and diltiazem. His home blood pressures were in the 170s, occasionally reaching the 180s. I increased Diltiazem dosing and referred to  hypertension clinic. Recent blood pressure readings reviewed today, noted to have improved, with values such as 125/63, 153/82, 125/71, and 111/60. No dizziness, lightheadedness, headaches, chest pain, or shortness of breath.  He has reduced his alcohol intake to about two beers and a glass of wine at bedtime. He acknowledges needing to increase his exercise and has access to a nearby park for walking. He reports no issues with his current medications, which include carvedilol 12.5 mg twice daily, diltiazem 240 mg daily, and ramipril 10 mg daily. He has not experienced any more dizziness or syncope since discontinuing hydrochlorothiazide.  He reports a productive cough but has not been around anyone sick. He received a flu shot and recalls a past incident where he tested positive for COVID-19 antibodies without knowing he had symptoms. He  denies any swelling in the legs or ankles but notes occasional venous congestion in the lower extremities without discomfort.       Today patient denies chest pain, shortness of breath, lower extremity edema, fatigue, palpitations, melena, hematuria, hemoptysis, diaphoresis, weakness, presyncope, syncope, orthopnea, and PND.   Studies Reviewed:     Risk Assessment/Calculations:              Physical Exam:   VS:  BP 100/70   Pulse 75   Ht 5\' 9"  (1.753 m)   Wt 168 lb 6.4 oz (76.4 kg)   SpO2 96%   BMI 24.87 kg/m    Wt Readings from Last 3 Encounters:  01/25/24 168 lb 6.4 oz (76.4 kg)  11/06/23 176 lb (79.8 kg)  10/23/23 175 lb (79.4 kg)     Physical Exam Vitals reviewed.  Constitutional:      Appearance: Normal appearance.  HENT:     Head: Normocephalic.  Eyes:     Pupils: Pupils are equal, round, and reactive to light.  Cardiovascular:     Rate and Rhythm: Normal rate and regular rhythm.     Pulses: Normal pulses.     Heart sounds: Normal heart sounds.  Pulmonary:     Effort: Pulmonary effort is normal.     Breath sounds: Normal breath sounds.  Abdominal:     General: Abdomen is flat.     Palpations: Abdomen is soft.  Musculoskeletal:     Right lower leg: No edema.     Left lower leg: No edema.  Skin:    General: Skin is warm and dry.     Capillary Refill: Capillary refill takes less than  2 seconds.  Neurological:     General: No focal deficit present.     Mental Status: He is alert and oriented to person, place, and time.  Psychiatric:        Mood and Affect: Mood normal.        Behavior: Behavior normal.        Thought Content: Thought content normal.        Judgment: Judgment normal.     ASSESSMENT AND PLAN:     Assessment and Plan    Hypertension Patient with recent hypertension and hx mild LVH. Since his last pharmD clinic appointment, BP under better control with home readings ranging from 111/60 to 153/82. Average systolic BP is around  . No symptoms of hypotension. Currently on Carvedilol 12.5mg  BID, Diltiazem 240mg  daily, and Ramipril 10mg  daily. -Continue current medications. -Encourage further lifestyle modifications including reduced alcohol consumption and increased exercise. -Check blood pressure twice daily until next appointment with hypertension clinic. -Bring home blood pressure cuff to next appointment for calibration check.  Hyperlipidemia Well controlled, LDL 34. -Continue Atorvastatin 40mg .   Cerebrovascular Disease TIA and cerebrovascular disease. Currently on Plavix. - Continue Plavix as prescribed by PCP  General Health Maintenance -Encourage continued lifestyle modifications including reduced alcohol consumption and increased exercise. -Schedule follow-up appointment in 1 year unless new concerns arise.             Signed, Perlie Gold, PA-C

## 2024-01-31 ENCOUNTER — Other Ambulatory Visit: Payer: Self-pay | Admitting: Cardiology

## 2024-02-08 ENCOUNTER — Other Ambulatory Visit: Payer: Self-pay | Admitting: Family Medicine

## 2024-02-09 NOTE — Telephone Encounter (Signed)
 Requested Prescriptions  Pending Prescriptions Disp Refills   buPROPion (WELLBUTRIN XL) 150 MG 24 hr tablet [Pharmacy Med Name: BUPROPION XL 150MG  TABLETS (24 H)] 30 tablet 1    Sig: TAKE 1 TABLET(150 MG) BY MOUTH DAILY     Psychiatry: Antidepressants - bupropion Failed - 02/09/2024 12:07 PM      Failed - Valid encounter within last 6 months    Recent Outpatient Visits           1 year ago History of TIA (transient ischemic attack)   [facility] Donita Brooks, MD   2 years ago Long term (current) use of anticoagulants   [facility] Valentino Nose, NP   3 years ago History of TIA (transient ischemic attack)   [facility] Pickard, Priscille Heidelberg, MD   4 years ago General medical exam   [facility] Donita Brooks, MD   5 years ago General medical exam   [facility] Donita Brooks, MD       Future Appointments             In 5 days [facility], [facility]             Passed - Cr in normal range and within 360 days    Creat  Date Value Ref Range Status  10/23/2023 0.91 0.70 - 1.28 mg/dL Final         Passed - AST in normal range and within 360 days    AST  Date Value Ref Range Status  10/23/2023 18 10 - 35 U/L Final         Passed - ALT in normal range and within 360 days    ALT  Date Value Ref Range Status  10/23/2023 12 9 - 46 U/L Final         Passed - Completed PHQ-2 or PHQ-9 in the last 360 days      Passed - Last BP in normal range    BP Readings from Last 1 Encounters:  01/25/24 122/68

## 2024-02-14 ENCOUNTER — Ambulatory Visit: Payer: PPO | Attending: Internal Medicine | Admitting: Pharmacist

## 2024-02-14 VITALS — BP 140/80 | HR 69

## 2024-02-14 DIAGNOSIS — I10 Essential (primary) hypertension: Secondary | ICD-10-CM

## 2024-02-14 NOTE — Assessment & Plan Note (Signed)
 Assessment: Blood pressure above goal of less than 130/80 Home blood pressures vary and there are not a lot of readings available Patient really does not want to add or change medications Strongly encouraged him to get up and get moving.  Go for a walk daily.  Advised that if his blood pressure is not improved at next visit we will need to add more medication  Plan: Check blood pressure at home daily Start exercising Follow-up in 2 months.  If blood pressure not improved we will need to add or change medication

## 2024-02-14 NOTE — Progress Notes (Signed)
 Patient ID: STANISLAW ACTON                 DOB: 04/26/1951                      MRN: [id number]      HPI: Walter Cross is a 73 y.o. male referred by Perlie Gold, PA to HTN clinic. PMH is significant for HTN, HLD, TIA and LVH.   He was started on a new antihypertensive medication by Dr. Tanya Nones, hydrochlorothiazide due to significant hypertension noted in clinic. However, after just a few doses, patient experienced an episode of syncope prompting ED evaluation This episode lasted approximately 10-15 minutes and required paramedic intervention. ED evaluation noted hypokalemia. Hydrochlorothiazide was discontinued and patient started on short course of K supplementation. Repeat labs following these changes show normalization of K.   Seen by Perlie Gold 11/06/23. BP was 148/74 in clinic. Patient reported BP at home in the 150-170's in the evening. Diltiazem was increased to 240mg .   Patient seen in Pharm.D. clinic 12/12/2023.  His home blood pressures per memory were between 140 and 150 over 70s heart rate 80s to 90s.  Blood pressure in clinic that day 132/70.  Clinic readings and home readings do not correlate.  I asked him to purchase new blood pressure cuff and cut back on alcohol and increase his physical activity.  No medication changes were made that day.  I last saw patient 12/29/2023.  Blood pressure in clinic was 150/82.  Patient had not increased his exercise but reported cutting back on alcohol.  Medication changes were recommended but patient refused preferring instead to work on lifestyle.    Blood pressure at appointment 01/25/2024 with Clayburn Pert was 122/68.   Patient today for follow-up.  He does bring in his home blood pressure cuff and a list of some readings.  He has about 11 readings since the beginning of January.  They vary greatly from 111-154/60-72.  Home blood pressure cuff was found to be accurate.  Blood pressure came down to 140/80 after repeat measurement.  He states  that he increase his exercise slightly.  Rest for at least 15 minutes before checking blood pressure.  Home BP cuff:149/94 Home BP cuff: 153/81 Clinic manual: 156/78   Current HTN meds: carvedilol 12.5mg  twice a day, diltiazem 240mg  daily, ramipril 10mg  daily Previously tried: hydrochlorothiazide (hypokalemia, syncope) BP goal: <130/80  Family History:  Family History  Problem Relation Age of Onset   Cancer Mother 39       cervical   Cancer Father        lung   Stroke Brother    Atrial fibrillation Other     Social History: 2 beers almost everyday, sometimes a class of wine, no tobacco  Diet: 1 cup half calf, tries to avoid salt  Exercise:  Very little- has increased a little- plans to improve  Home BP readings:  125 71  111 60  140 64  154 72  145 72  118 60  112 65  146 65  131.375 66.125    Wt Readings from Last 3 Encounters:  01/25/24 168 lb 6.4 oz (76.4 kg)  11/06/23 176 lb (79.8 kg)  10/23/23 175 lb (79.4 kg)   BP Readings from Last 3 Encounters:  02/14/24 (!) 140/80  01/25/24 122/68  12/29/23 (!) 150/82   Pulse Readings from Last 3 Encounters:  02/14/24 69  01/25/24 75  12/29/23 70    Renal  function: CrCl cannot be calculated (Patient's most recent lab result is older than the maximum 21 days allowed.).  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 Echo 2007: mod LVH, diast dysfn, mild MR, no LVOT obstruction;  b. echo 9/13: upper septal thickening, no SAM of MV, no LVOT gradient, mild LVH, EF 65%   TIA (transient ischemic attack)    Vitamin D deficiency      Current Outpatient Medications on File Prior to Visit  Medication Sig Dispense Refill   acyclovir ointment (ZOVIRAX) 5 % Apply 1 application topically daily as needed (cold sores & herpes). 15 g 1   atorvastatin (LIPITOR) 40 MG tablet TAKE 1 TABLET(40 MG) BY MOUTH DAILY 90 tablet 3   buPROPion (WELLBUTRIN XL) 150 MG 24 hr tablet TAKE 1 TABLET(150 MG) BY MOUTH DAILY 30 tablet 1   carvedilol (COREG) 12.5 MG tablet TAKE 1 TABLET(12.5 MG) BY MOUTH TWICE DAILY WITH A MEAL 180 tablet 3   Cholecalciferol (VITAMIN D) 50 MCG (2000 UT) CAPS Take 1 capsule by mouth daily in the afternoon.     clonazePAM (KLONOPIN) 0.5 MG tablet Take 1 tablet (0.5 mg total) by mouth 2 (two) times daily as needed for anxiety. 30 tablet 1   clopidogrel (PLAVIX) 75 MG tablet TAKE 1 TABLET(75 MG) BY MOUTH DAILY 90 tablet 1   diltiazem (CARDIZEM CD) 240 MG 24 hr capsule TAKE 1 CAPSULE(240 MG) BY MOUTH DAILY 90 capsule 3   folic acid (FOLVITE) 1 MG tablet TAKE 2 TABLETS(2 MG) BY MOUTH DAILY 180 tablet 0   pantoprazole (PROTONIX) 40 MG tablet Take 1 tablet (40 mg total) by mouth daily. 90 tablet 1   ramipril (ALTACE) 10 MG capsule TAKE 1 CAPSULE(10 MG) BY MOUTH DAILY 90 capsule 1   sildenafil (REVATIO) 20 MG tablet 3-5 tablets daily as needed 50 tablet 6   tamsulosin (FLOMAX) 0.4 MG CAPS capsule TAKE 1 CAPSULE(0.4 MG) BY MOUTH DAILY 90 capsule 3   TRINTELLIX 10 MG TABS tablet TAKE 1 TABLET(10 MG) BY MOUTH DAILY 90 tablet 3   No current facility-administered medications on file prior to visit.    Allergies  Allergen Reactions   Sulfa Antibiotics     Pt reports causes aches all over   Esomeprazole Magnesium Other (See Comments)    cramps   Septra [Bactrim] Other (See Comments)    Back ache   Sulfamethoxazole-Trimethoprim     Other reaction(s): Other (See Comments) Back ache Back ache Back ache     Blood pressure (!) 140/80, pulse 69.   Assessment/Plan: HYPERTENSION CONTROL Vitals:   02/14/24 1455 02/14/24  1501  BP: (!) 156/78 (!) 140/80    The patient's blood pressure is elevated above target today.  In order to address the patient's elevated BP: Blood pressure will be monitored at home to determine if medication changes need to be made.      1. Hypertension -  HTN (hypertension) Assessment: Blood pressure above goal of less  than 130/80 Home blood pressures vary and there are not a lot of readings available Patient really does not want to add or change medications Strongly encouraged him to get up and get moving.  Go for a walk daily.  Advised that if his blood pressure is not improved at next visit we will need to add more medication  Plan: Check blood pressure at home daily Start exercising Follow-up in 2 months.  If blood pressure not improved we will need to add or change medication     Thank you  Olene Floss, Pharm.D, BCACP, CPP [facility] A Division of [facility] [address], [place], [place] [address]  Phone: [phone]; Fax: [phone]

## 2024-02-14 NOTE — Patient Instructions (Addendum)
 Your blood pressure goal is < 130/5mmHg  Please check blood pressure daily. Please bring list of readings to next visit   Please increase your physical activity - goal is to walk 150 min per week Continue carvedilol 12.5mg  twice a day, diltiazem 240mg  daily, ramipril 10mg  daily   Important lifestyle changes to control high blood pressure  Intervention  Effect on the BP   Weight loss Weight loss is one of the most effective lifestyle changes for controlling blood pressure. If you're overweight or obese, losing even a small amount of weight can help reduce blood pressure.    Blood pressure can decrease by 1 millimeter of mercury (mmHg) with each kilogram (about 2.2 pounds) of weight lost.   Exercise regularly As a general goal, aim for 30 minutes of moderate physical activity every day.    Regular physical activity can lower blood pressure by 5 - 8 mmHg.   Eat a healthy diet Eat a diet rich in whole grains, fruits, vegetables, lean meat, and low-fat dairy products. Limit processed foods, saturated fat, and sweets.    A heart-healthy diet can lower high blood pressure by 10 mmHg.   Reduce salt (sodium) in your diet Aim for 000mg  of sodium each day. Avoid deli meats, canned food, and frozen microwave meals which are high in sodium.     Limiting sodium can reduce blood pressure by 5 mmHg.   Limit alcohol One drink equals 12 ounces of beer, 5 ounces of wine, or 1.5 ounces of 80-proof liquor.    Limiting alcohol to < 1 drink a day for women or < 2 drinks a day for men can help lower blood pressure by about 4 mmHg.   To check your pressure at home you will need to:   Sit up in a chair, with feet flat on the floor and back supported. Do not cross your ankles or legs. Rest your left arm so that the cuff is about heart level. If the cuff goes on your upper arm, then just relax your arm on the table, arm of the chair, or your lap. If you have a wrist cuff, hold your wrist against  your chest at heart level. Place the cuff snugly around your arm, about 1 inch above the crease of your elbow. The cords should be inside the groove of your elbow.  Sit quietly, with the cuff in place, for about 5 minutes. Then press the power button to start a reading. Do not talk or move while the reading is taking place.  Record your readings on a sheet of paper. Although most cuffs have a memory, it is often easier to see a pattern developing when the numbers are all in front of you.  You can repeat the reading after 1-3 minutes if it is recommended.   Make sure your bladder is empty and you have not had caffeine or tobacco within the last 30 minutes   Always bring your blood pressure log with you to your appointments. If you have not brought your monitor in to be double checked for accuracy, please bring it to your next appointment.   You can find a list of validated (accurate) blood pressure cuffs at: validatebp.org

## 2024-03-05 ENCOUNTER — Other Ambulatory Visit: Payer: Self-pay | Admitting: Family Medicine

## 2024-03-06 NOTE — Telephone Encounter (Signed)
 Requested Prescriptions  Pending Prescriptions Disp Refills   clopidogrel (PLAVIX) 75 MG tablet [Pharmacy Med Name: CLOPIDOGREL 75MG  TABLETS] 90 tablet 0    Sig: TAKE 1 TABLET(75 MG) BY MOUTH DAILY     Hematology: Antiplatelets - clopidogrel Passed - 03/06/2024  9:55 AM      Passed - HCT in normal range and within 180 days    HCT  Date Value Ref Range Status  10/17/2023 39.2 39.0 - 52.0 % Final         Passed - HGB in normal range and within 180 days    Hemoglobin  Date Value Ref Range Status  10/17/2023 13.8 13.0 - 17.0 g/dL Final         Passed - PLT in normal range and within 180 days    Platelets  Date Value Ref Range Status  10/17/2023 234 150 - 400 K/uL Final         Passed - Cr in normal range and within 360 days    Creat  Date Value Ref Range Status  10/23/2023 0.91 0.70 - 1.28 mg/dL Final         Passed - Valid encounter within last 6 months    Recent Outpatient Visits           4 months ago Hypokalemia   [place] [facility] Park Meo, FNP   4 months ago History of TIA (transient ischemic attack)   [place] [facility] Donita Brooks, MD   1 year ago Nausea   [place] [facility] Donita Brooks, MD   1 year ago Fatigue, unspecified type   [place] [facility] Pickard, Priscille Heidelberg, MD       Future Appointments             In 1 month Roselind Messier, Laurene Footman, RPH-CPP [facility] at [facility], [facility]

## 2024-03-16 ENCOUNTER — Other Ambulatory Visit: Payer: Self-pay | Admitting: Family Medicine

## 2024-03-18 NOTE — Telephone Encounter (Signed)
 Requested Prescriptions  Pending Prescriptions Disp Refills   buPROPion  (WELLBUTRIN  XL) 150 MG 24 hr tablet [Pharmacy Med Name: BUPROPION  XL 150MG  TABLETS (24 H)] 90 tablet 0    Sig: TAKE 1 TABLET(150 MG) BY MOUTH DAILY     Psychiatry: Antidepressants - bupropion  Failed - 03/18/2024 11:07 AM      Failed - Last BP in normal range    BP Readings from Last 1 Encounters:  02/14/24 (!) 140/80         Passed - Cr in normal range and within 360 days    Creat  Date Value Ref Range Status  10/23/2023 0.91 0.70 - 1.28 mg/dL Final         Passed - AST in normal range and within 360 days    AST  Date Value Ref Range Status  10/23/2023 18 10 - 35 U/L Final         Passed - ALT in normal range and within 360 days    ALT  Date Value Ref Range Status  10/23/2023 12 9 - 46 U/L Final         Passed - Completed PHQ-2 or PHQ-9 in the last 360 days      Passed - Valid encounter within last 6 months    Recent Outpatient Visits           4 months ago Hypokalemia   [facility] Jenelle Mis, FNP   5 months ago History of TIA (transient ischemic attack)   [place] [facility] Austine Lefort, MD   1 year ago Nausea   [place] [facility] Austine Lefort, MD   1 year ago Fatigue, unspecified type   [place] [facility] Pickard, Cisco Crest, MD       Future Appointments             In 3 weeks Maccia, Melissa D, RPH-CPP [facility] at [facility], [facility]

## 2024-04-11 ENCOUNTER — Ambulatory Visit: Attending: Cardiovascular Disease | Admitting: Pharmacist

## 2024-04-11 VITALS — BP 132/80 | HR 66

## 2024-04-11 DIAGNOSIS — I10 Essential (primary) hypertension: Secondary | ICD-10-CM | POA: Diagnosis not present

## 2024-04-11 NOTE — Assessment & Plan Note (Signed)
 Assessment: Blood pressure in clinic initially elevated improved with rest Home blood pressures averaging 130/65 Discussed with patient increasing his exercise.  He does report that he feels better after he walks.  Encouraged him to increase to 3 days a week for a few weeks and then try to increase to 4 days a week. Patient very hesitant for any medication changes Overall his blood pressure has improved and he does have several that were on the lower end  Plan: Continue carvedilol  12.5 mg twice a day, diltiazem  240 mg daily, ramipril  10 mg daily Continue to increase physical activity Encouraged him to speak to his PCP about his increased depression symptoms and shakiness on his feet Follow-up with me in 3 months Continue to monitor blood pressure at home

## 2024-04-11 NOTE — Progress Notes (Signed)
 Patient ID: [name]                 DOB: 1951/03/19                      MRN: [id number]      HPI: Walter Cross is a 73 y.o. male referred by Leala Prince, PA to HTN clinic. PMH is significant for HTN, HLD, TIA and LVH.   He was started on a new antihypertensive medication by Dr. Cheril Cork, hydrochlorothiazide  due to significant hypertension noted in clinic. However, after just a few doses, patient experienced an episode of syncope prompting ED evaluation This episode lasted approximately 10-15 minutes and required paramedic intervention. ED evaluation noted hypokalemia. Hydrochlorothiazide  was discontinued and patient started on short course of K supplementation. Repeat labs following these changes show normalization of K.   Seen by Leala Prince 11/06/23. BP was 148/74 in clinic. Patient reported BP at home in the 150-170's in the evening. Diltiazem  was increased to 240mg .   Patient seen in Pharm.D. clinic 12/12/2023.  His home blood pressures per memory were between 140 and 150 over 70s heart rate 80s to 90s.  Blood pressure in clinic that day 132/70.  Clinic readings and home readings do not correlate.  I asked him to purchase new blood pressure cuff and cut back on alcohol and increase his physical activity.  No medication changes were made that day.  I last saw patient 12/29/2023.  Blood pressure in clinic was 150/82.  Patient had not increased his exercise but reported cutting back on alcohol.  Medication changes were recommended but patient refused preferring instead to work on lifestyle.    Blood pressure at appointment 01/25/2024 with Marysue Sola was 122/68. However at visit with me 02/14/24 BP was 140/80. Home readings were variable. Home cuff found to be accurate. He refused medication changes. Strongly encouraged increase in physical activity.   Patient presents today to follow up. He reports that he has been better about exercise but could do more.  He has been walking about 20  minutes twice a week.  Reports that he feels like his antidepressants are not working well.  Does not have much desire to get up and do things.  Denies any suicidal thoughts.  Also reports increasing shakiness on his feet.  I encouraged him to talk to Dr. Cheril Cork about this.  Blood pressures at home vary from the low 100s to 150s.  Averaging about 130/65.  Reports compliance with his medications.  Heart rates 60s 70s.  Current HTN meds: carvedilol  12.5mg  twice a day, diltiazem  240mg  daily, ramipril  10mg  daily Previously tried: hydrochlorothiazide  (hypokalemia, syncope) BP goal: <130/80  Family History:  Family History  Problem Relation Age of Onset   Cancer Mother 45       cervical   Cancer Father        lung   Stroke Brother    Atrial fibrillation Other     Social History: 2 beers almost everyday, sometimes a class of wine, no tobacco  Diet: 1 cup half calf, tries to avoid salt  Exercise:  Walking 20 min twice a week  Home BP readings:  141 69 125 63 153 82 125 71 111 60 140 64 154 72 145 72 118 60 121 65 146 65 120 56 123  56 117 55 108 57 110 62 121 57 139  74 136 74 148 73 130.05 65.35  Wt Readings from Last 3 Encounters:  01/25/24 168 lb  6.4 oz (76.4 kg)  11/06/23 176 lb (79.8 kg)  10/23/23 175 lb (79.4 kg)   BP Readings from Last 3 Encounters:  04/11/24 132/80  02/14/24 (!) 140/80  01/25/24 122/68   Pulse Readings from Last 3 Encounters:  04/11/24 66  02/14/24 69  01/25/24 75    Renal function: CrCl cannot be calculated (Patient's most recent lab result is older than the maximum 21 days allowed.).  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 Echo 2007: mod LVH, diast dysfn, mild MR, no LVOT obstruction;  b. echo 9/13: upper septal thickening, no SAM of MV, no LVOT gradient, mild LVH, EF 65%   TIA (transient ischemic attack)    Vitamin D deficiency     Current Outpatient Medications on File Prior to Visit  Medication Sig Dispense Refill   atorvastatin  (LIPITOR) 40 MG tablet TAKE 1 TABLET(40 MG) BY MOUTH DAILY 90 tablet 3   buPROPion  (WELLBUTRIN  XL) 150 MG 24 hr tablet TAKE 1 TABLET(150 MG) BY MOUTH DAILY 90 tablet 0   carvedilol  (COREG ) 12.5 MG tablet TAKE 1 TABLET(12.5 MG) BY MOUTH TWICE DAILY WITH A MEAL 180 tablet 3   Cholecalciferol (VITAMIN D) 50 MCG (2000 UT) CAPS Take 1 capsule by mouth daily in the afternoon.     clonazePAM  (KLONOPIN ) 0.5 MG tablet Take 1 tablet (0.5 mg total) by mouth 2 (two) times daily as needed for anxiety. 30 tablet 1   clopidogrel  (PLAVIX ) 75 MG tablet TAKE 1 TABLET(75 MG) BY MOUTH DAILY 90 tablet 0   diltiazem  (CARDIZEM  CD) 240 MG 24 hr capsule TAKE 1 CAPSULE(240 MG) BY MOUTH DAILY 90 capsule 3   folic acid  (FOLVITE ) 1 MG tablet TAKE 2 TABLETS(2 MG) BY MOUTH DAILY 180 tablet 0   pantoprazole  (PROTONIX ) 40 MG tablet Take 1 tablet (40 mg total) by mouth daily. 90 tablet 1   ramipril  (ALTACE ) 10 MG capsule TAKE 1 CAPSULE(10 MG) BY MOUTH DAILY 90 capsule 1   tamsulosin  (FLOMAX ) 0.4 MG CAPS capsule TAKE 1 CAPSULE(0.4 MG) BY MOUTH DAILY 90 capsule 3   TRINTELLIX  10 MG TABS tablet TAKE 1 TABLET(10 MG) BY MOUTH DAILY 90 tablet 3   acyclovir  ointment (ZOVIRAX ) 5 % Apply 1 application topically daily as needed (cold sores & herpes). 15 g 1   sildenafil  (REVATIO ) 20 MG tablet 3-5 tablets daily as needed 50 tablet 6   No current facility-administered medications on file prior to visit.    Allergies  Allergen Reactions   Sulfa Antibiotics     Pt reports causes aches all over   Esomeprazole Magnesium Other (See  Comments)    cramps   Septra [Bactrim] Other (See Comments)    Back ache   Sulfamethoxazole-Trimethoprim     Other reaction(s): Other (See Comments) Back ache Back ache Back ache     Blood pressure 132/80, pulse 66.   Assessment/Plan:     1. Hypertension -  HTN (hypertension) Assessment: Blood pressure in clinic initially  elevated improved with rest Home blood pressures averaging 130/65 Discussed with patient increasing his exercise.  He does report that he feels better after he walks.  Encouraged him to increase to 3 days a week for a few weeks and then try to increase to 4 days a week. Patient very hesitant for any medication changes Overall his blood pressure has improved and he does have several that were on the lower end  Plan: Continue carvedilol  12.5 mg twice a day, diltiazem  240 mg daily, ramipril  10 mg daily Continue to increase physical activity Encouraged him to speak to his PCP about his increased depression symptoms and shakiness on his feet Follow-up with me in 3 months Continue to monitor blood pressure at home   Thank you  Zyanne Schumm D Needham Biggins, Pharm.Monika Annas, CPP [facility] A Division of [facility] [address], [place], [place] [address]  Phone: [phone]; Fax: [phone]

## 2024-04-11 NOTE — Patient Instructions (Addendum)
 Please continue carvedilol  12.5mg  twice a day, diltiazem  240mg  daily, ramipril  10mg  daily  Try to increase your walking to 3 days a week. Then after a week or two, try to increase to 4 or 5 times a week.  Continue to monitor blood pressure at home.  I will see you in August. I will send you appointment time when my schedule is open.  Your blood pressure goal is < 130/12mmHg    Important lifestyle changes to control high blood pressure  Intervention  Effect on the BP   Weight loss Weight loss is one of the most effective lifestyle changes for controlling blood pressure. If you're overweight or obese, losing even a small amount of weight can help reduce blood pressure.    Blood pressure can decrease by 1 millimeter of mercury (mmHg) with each kilogram (about 2.2 pounds) of weight lost.   Exercise regularly As a general goal, aim for 30 minutes of moderate physical activity every day.    Regular physical activity can lower blood pressure by 5 - 8 mmHg.   Eat a healthy diet Eat a diet rich in whole grains, fruits, vegetables, lean meat, and low-fat dairy products. Limit processed foods, saturated fat, and sweets.    A heart-healthy diet can lower high blood pressure by 10 mmHg.   Reduce salt (sodium) in your diet Aim for 000mg  of sodium each day. Avoid deli meats, canned food, and frozen microwave meals which are high in sodium.     Limiting sodium can reduce blood pressure by 5 mmHg.   Limit alcohol One drink equals 12 ounces of beer, 5 ounces of wine, or 1.5 ounces of 80-proof liquor.    Limiting alcohol to < 1 drink a day for women or < 2 drinks a day for men can help lower blood pressure by about 4 mmHg.   To check your pressure at home you will need to:   Sit up in a chair, with feet flat on the floor and back supported. Do not cross your ankles or legs. Rest your left arm so that the cuff is about heart level. If the cuff goes on your upper arm, then just relax your  arm on the table, arm of the chair, or your lap. If you have a wrist cuff, hold your wrist against your chest at heart level. Place the cuff snugly around your arm, about 1 inch above the crease of your elbow. The cords should be inside the groove of your elbow.  Sit quietly, with the cuff in place, for about 5 minutes. Then press the power button to start a reading. Do not talk or move while the reading is taking place.  Record your readings on a sheet of paper. Although most cuffs have a memory, it is often easier to see a pattern developing when the numbers are all in front of you.  You can repeat the reading after 1-3 minutes if it is recommended.   Make sure your bladder is empty and you have not had caffeine or tobacco within the last 30 minutes   Always bring your blood pressure log with you to your appointments. If you have not brought your monitor in to be double checked for accuracy, please bring it to your next appointment.   You can find a list of validated (accurate) blood pressure cuffs at: validatebp.org

## 2024-04-15 ENCOUNTER — Ambulatory Visit: Payer: Self-pay | Admitting: *Deleted

## 2024-04-15 NOTE — Telephone Encounter (Signed)
  Chief Complaint: Patient needs appointment to discuss medication SE and continuing current therapy  Symptoms: off balance, stumbling Frequency: noticed more since starting buPROPion  (WELLBUTRIN  XL) 150 MG 24 hr tablet   Disposition: [] ED /[] Urgent Care (no appt availability in office) / [] Appointment(In office/virtual)/ []  [facility]/ [] Home Care/ [] Refused Recommended Disposition /[] [facility]/ [x]  Follow-up with PCP Additional Notes: No open appointment- patient need appointment to discuss his medication concerns- see notes. Also has concerns about Trintellix    Copied from CRM [id number]. Topic: Clinical - Red Word Triage >> Apr 15, 2024 12:53 PM Chrystal Crape R wrote: Pt called to schedule an appointment for a medication evaluation however his balance has also been effected and he has fallen a week ago. Reason for Disposition  [1] Caller has URGENT medicine question about med that PCP or specialist prescribed AND [2] triager unable to answer question  Answer Assessment - Initial Assessment Questions 1. NAME of MEDICINE: "What medicine(s) are you calling about?"     TRINTELLIX  10 MG TABS tablet and Bupropion  xl 2. QUESTION: "What is your question?" (e.g., double dose of medicine, side effect)     Off balance/fall- usually off balance with standing- "stumble" with standing- gets off balance- occurring 1-2 weeks 3. PRESCRIBER: "Who prescribed the medicine?" Reason: if prescribed by specialist, call should be referred to that group.     PCP 4. SYMPTOMS: "Do you have any symptoms?" If Yes, ask: "What symptoms are you having?"  "How bad are the symptoms (e.g., mild, moderate, severe)     Off balance since started medication:buPROPion  (WELLBUTRIN  XL) 150 MG 24 hr tablet  Patient only has 2 doses left- should he continue? Patient also has concerns about Trintellix   - not as effective as in the past- needs appointment to discuss.  Protocols used: Medication Question Call-A-AH

## 2024-04-18 ENCOUNTER — Encounter: Payer: Self-pay | Admitting: Family Medicine

## 2024-04-18 ENCOUNTER — Ambulatory Visit (INDEPENDENT_AMBULATORY_CARE_PROVIDER_SITE_OTHER): Admitting: Family Medicine

## 2024-04-18 VITALS — BP 138/72 | HR 68 | Temp 97.6°F | Ht 69.0 in | Wt 168.0 lb

## 2024-04-18 DIAGNOSIS — R413 Other amnesia: Secondary | ICD-10-CM | POA: Diagnosis not present

## 2024-04-18 DIAGNOSIS — R270 Ataxia, unspecified: Secondary | ICD-10-CM

## 2024-04-18 DIAGNOSIS — R5383 Other fatigue: Secondary | ICD-10-CM | POA: Diagnosis not present

## 2024-04-18 NOTE — Progress Notes (Signed)
 Subjective:    Patient ID: Walter Cross, male    DOB: 07/17/1951, 73 y.o.   MRN: [id number]  Dizziness  Depression        12/06/22 Patient is a 73 year old gentleman with a history of a TIA who presents today with dizziness.  He states that whenever he stands up, he will feel lightheaded.  He will lose his balance.  He has not fallen.  It lasts for a few seconds after he stands.  It then goes away.  It usually occurs when he standing up from a seated position after sitting.  He denies any syncope.  He denies any irregular heartbeats.  He denies any falls.  He states that this has been going on now for quite some time maybe even a year but it has steadily gotten worse.  Of note he is on carvedilol  and diltiazem  as well as tamsulosin .  He also reports fatigue.  He states that he feels listless with no energy.  He felt like Trintellix  was doing better to manage his depression with venlafaxine  and he is interested in switching to better manage his depression with the hope that he would have more energy and drive.  However he denies feeling overly depressed.  He denies suicidal thoughts.  He does report some mild short-term memory problems with.  He states he will frequently repeat questions.  He will forget things people have told him.  He denies forgetting to pay bills.  He denies losing money.  He denies getting lost while driving.  At that time, my plan was: I am concerned that the patient's orthostatic dizziness is likely a drop in his blood pressure with standing.  This is likely made worse due to his carvedilol  and diltiazem  as well as tamsulosin .  I will have the patient hold tamsulosin  to see if this improves.  I also encouraged him to drink more water and to limit his alcohol to less than 2 beers a day.  Recheck in 1 week.  If symptoms or not improving at that point I would recommend imaging of the brain given his previous history of stroke to evaluate for any cerebellar lesion as the patient  does have an abnormal wide-based shuffling gait.  Given his fatigue I will check a CBC a CMP TSH and B12 and testosterone  level.  If lab work is normal, I will consider switching the patient's venlafaxine  to Trintellix  as his fatigue may be related to depression.  04/18/24 Patient presents today complaining of 3 to 4 weeks of worsening dizziness and balance issues.  However the symptoms were present 17 months ago.  At that time he obtained an MRI that showed no specific lesion.  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  Patient presents today stating that he is losing his balance more easily.  There is an old bruise on his forehead where he lost his balance and fell into a door in his bathroom.  He also tends to reach out and hold the wall or the counter when he is walking.  He states that this has gotten particularly worse over the last 4 weeks.  He denies any vertigo.  He denies any syncope.  Of note, he has a shuffling gait.  He has diminished armswing.  There is no evidence of any flat affect or decreased blink reflex.  There is no pronator drift on exam.  However the patient does have an abnormal finger-to-nose test.  At extreme reach, he has a wide tremor.  He also reports memory loss.  He states that his girlfriend complains of constantly answering the same question.  He also occasionally gets lost while driving. Past Surgical History:  Procedure Laterality Date   CERVICAL SPINE SURGERY  [phone]   nerve release, Dr Selestino Dakin   SPINE SURGERY     L4-L5, [phone]   Current Outpatient Medications on File Prior to Visit  Medication Sig Dispense Refill   acyclovir  ointment (ZOVIRAX ) 5 % Apply 1 application topically daily as needed (cold sores & herpes). 15 g 1   atorvastatin  (LIPITOR) 40 MG tablet TAKE 1 TABLET(40 MG) BY MOUTH DAILY 90 tablet 3   buPROPion  (WELLBUTRIN  XL) 150 MG 24 hr tablet TAKE 1 TABLET(150 MG) BY MOUTH DAILY 90 tablet 0   carvedilol  (COREG ) 12.5 MG tablet TAKE 1  TABLET(12.5 MG) BY MOUTH TWICE DAILY WITH A MEAL 180 tablet 3   Cholecalciferol (VITAMIN D) 50 MCG (2000 UT) CAPS Take 1 capsule by mouth daily in the afternoon.     clonazePAM  (KLONOPIN ) 0.5 MG tablet Take 1 tablet (0.5 mg total) by mouth 2 (two) times daily as needed for anxiety. 30 tablet 1   clopidogrel  (PLAVIX ) 75 MG tablet TAKE 1 TABLET(75 MG) BY MOUTH DAILY 90 tablet 0   diltiazem  (CARDIZEM  CD) 240 MG 24 hr capsule TAKE 1 CAPSULE(240 MG) BY MOUTH DAILY 90 capsule 3   folic acid  (FOLVITE ) 1 MG tablet TAKE 2 TABLETS(2 MG) BY MOUTH DAILY 180 tablet 0   pantoprazole  (PROTONIX ) 40 MG tablet Take 1 tablet (40 mg total) by mouth daily. 90 tablet 1   ramipril  (ALTACE ) 10 MG capsule TAKE 1 CAPSULE(10 MG) BY MOUTH DAILY 90 capsule 1   sildenafil  (REVATIO ) 20 MG tablet 3-5 tablets daily as needed 50 tablet 6   tamsulosin  (FLOMAX ) 0.4 MG CAPS capsule TAKE 1 CAPSULE(0.4 MG) BY MOUTH DAILY 90 capsule 3   TRINTELLIX  10 MG TABS tablet TAKE 1 TABLET(10 MG) BY MOUTH DAILY 90 tablet 3   No current facility-administered medications on file prior to visit.   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[employer]  Tobacco Use   Smoking status: Former    Current packs/day: 0.00    Average packs/day: 1 pack/day for 7.0 years (7.0 ttl pk-yrs)    Types: Cigarettes    Start date: 11/28/1968    Quit date: 11/29/1975    Years since quitting:  48.4   Smokeless tobacco: Never  Vaping Use   Vaping status: Never Used  Substance and Sexual Activity   Alcohol use: Yes    Alcohol/week: 2.0 standard drinks of alcohol    Types: 2 Standard drinks or equivalent per week   Drug use: No   Sexual activity: Not Currently    Birth control/protection: None  Other Topics Concern   Not on file  Social History Narrative  Not on file   Social Drivers of Health   Financial Resource Strain: Low Risk  (04/16/2024)   Overall Financial Resource Strain (CARDIA)    Difficulty of Paying Living Expenses: Not hard at all  Food Insecurity: No Food Insecurity (04/16/2024)   Hunger Vital Sign    Worried About Running Out of Food in the Last Year: Never true    Ran Out of Food in the Last Year: Never true  Transportation Needs: No Transportation Needs (04/16/2024)   PRAPARE - Administrator, Civil Service (Medical): No    Lack of Transportation (Non-Medical): No  Physical Activity: Inactive (04/16/2024)   Exercise Vital Sign    Days of Exercise per Week: 0 days    Minutes of Exercise per Session: 30 min  Stress: Stress Concern Present (04/16/2024)   [employer] of Occupational Health - Occupational Stress Questionnaire    Feeling of Stress : To some extent  Social Connections: Moderately Isolated (04/16/2024)   Social Connection and Isolation Panel [NHANES]    Frequency of Communication with Friends and Family: Twice a week    Frequency of Social Gatherings with Friends and Family: Twice a week    Attends Religious Services: Never    Database administrator or Organizations: No    Attends Banker Meetings: Never    Marital Status: Living with partner  Intimate Partner Violence: Not At Risk (06/29/2023)   Humiliation, Afraid, Rape, and Kick questionnaire    Fear of Current or Ex-Partner: No    Emotionally Abused: No    Physically Abused: No    Sexually Abused: No   Family History  Problem Relation Age of Onset    Cancer Mother 42       cervical   Cancer Father        lung   Stroke Brother    Atrial fibrillation Other      Review of Systems  Neurological:  Positive for dizziness.  All other systems reviewed and are negative.      Objective:   Physical Exam Vitals reviewed.  Constitutional:      General: He is not in acute distress.    Appearance: He is well-developed. He is not diaphoretic.  HENT:     Head: Normocephalic and atraumatic.     Right Ear: External ear normal.     Left Ear: External ear normal.     Nose: Nose normal.     Mouth/Throat:     Pharynx: No oropharyngeal exudate.  Eyes:     General: No scleral icterus.       Right eye: No discharge.        Left eye: No discharge.     Conjunctiva/sclera: Conjunctivae normal.     Pupils: Pupils are equal, round, and reactive to light.  Neck:     Thyroid : No thyromegaly.     Vascular: No JVD.     Trachea: No tracheal deviation.  Cardiovascular:     Rate and Rhythm: Normal rate and regular rhythm.     Heart sounds: Normal heart sounds. No murmur heard.    No friction rub. No gallop.  Pulmonary:     Effort: Pulmonary effort is normal. No respiratory distress.     Breath sounds: Normal breath sounds. No stridor. No wheezing or rales.  Chest:     Chest wall: No tenderness.  Abdominal:     General: Bowel sounds are normal. There is no distension.     Palpations:  Abdomen is soft. There is no mass.     Tenderness: There is no abdominal tenderness. There is no guarding or rebound.  Genitourinary:    Prostate: Normal.     Rectum: Normal.  Musculoskeletal:        General: No tenderness. Normal range of motion.     Cervical back: Normal range of motion and neck supple.  Lymphadenopathy:     Cervical: No cervical adenopathy.  Skin:    General: Skin is warm.     Coloration: Skin is not pale.     Findings: No erythema or rash.  Neurological:     Mental Status: He is alert and oriented to person, place, and time.     Cranial  Nerves: No cranial nerve deficit.     Motor: No abnormal muscle tone.     Coordination: Coordination normal.     Deep Tendon Reflexes: Reflexes are normal and symmetric.  Psychiatric:        Behavior: Behavior normal.        Thought Content: Thought content normal.        Judgment: Judgment normal.   Patient has a wide-based gait.  He shuffles while he walks.  Decreased armswing.      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  I am especially concerned because of his decreased armswing.  He is now falling and hitting his head.  Therefore I recommended a neurology consultation for further evaluation.

## 2024-04-27 ENCOUNTER — Other Ambulatory Visit: Payer: Self-pay | Admitting: Family Medicine

## 2024-05-03 ENCOUNTER — Encounter: Payer: Self-pay | Admitting: Neurology

## 2024-05-13 ENCOUNTER — Other Ambulatory Visit: Payer: Self-pay | Admitting: Family Medicine

## 2024-05-16 NOTE — Progress Notes (Unsigned)
 Assessment/Plan:   ***  Subjective:   Walter Cross was seen today in the movement disorders clinic for neurologic consultation at the request of Austine Lefort, MD.  The consultation is for the evaluation of balance change and tremor.  Medical records made available to me have been reviewed.  Patient has been to [facility] neurology previously, but more for concerns about TIA.  Patient has had balance trouble and dizziness for over a year and a half.  Medical records indicate that there have been concerns regarding his blood pressure medication causing orthostasis.  There have also been concerns about alcohol contributing to symptoms.  More recently, the patient went to his primary care physician and concerns about parkinsonism arose due to falls, shuffling gait, balance change and memory issues.  MRI brain was done for the same symptoms in April, 2024.  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  I personally reviewed the images.  Tremor: {yes no:314532}   How long has it been going on? ***  At rest or with activation?  ***  When is it noted the most?  ***  Fam hx of tremor?  {yes QJ:194174}  Located where?  ***  Affected by caffeine:  {yes no:314532}  Affected by alcohol:  {yes no:314532}  Affected by stress:  {yes no:314532}  Affected by fatigue:  {yes no:314532}  Spills soup if on spoon:  {yes no:314532}  Spills glass of liquid if full:  {yes no:314532}  Affects ADL's (tying shoes, brushing teeth, etc):  {yes no:314532}  Tremor inducing meds:  {yes no:314532}  Other Specific Symptoms:  Voice: *** Sleep: ***  Vivid Dreams:  {yes no:314532}  Acting out dreams:  {yes no:314532} Wet Pillows: {yes no:314532} Postural symptoms:  {yes no:314532}  Falls?  {yes no:314532} Bradykinesia symptoms: {parkinson brady:18041} Loss of smell:  {yes no:314532} Loss of taste:  {yes no:314532} Urinary Incontinence:  {yes no:314532} Difficulty Swallowing:  {yes  no:314532} Handwriting, micrographia: {yes no:314532} Trouble with ADL's:  {yes no:314532}  Trouble buttoning clothing: {yes no:314532} Depression:  {yes no:314532} Memory changes:  {yes no:314532} Hallucinations:  {yes no:314532}  visual distortions: {yes no:314532} N/V:  {yes no:314532} Lightheaded:  {yes no:314532}  Syncope: {yes no:314532} Diplopia:  {yes no:314532} Dyskinesia:  {yes no:314532}  Neuroimaging of the brain has *** previously been performed.  It *** available for my review today.  PREVIOUS MEDICATIONS: {Parkinson's RX:18200}  ALLERGIES:   Allergies  Allergen Reactions   Sulfa Antibiotics     Pt reports causes aches all over   Esomeprazole Magnesium Other (See Comments)    cramps   Septra [Bactrim] Other (See Comments)    Back ache   Sulfamethoxazole-Trimethoprim     Other reaction(s): Other (See Comments) Back ache Back ache Back ache     CURRENT MEDICATIONS:  Current Outpatient Medications  Medication Instructions   acyclovir  ointment (ZOVIRAX ) 5 % 1 application , Topical, Daily PRN   atorvastatin  (LIPITOR) 40 MG tablet TAKE 1 TABLET(40 MG) BY MOUTH DAILY   buPROPion  (WELLBUTRIN  XL) 150 MG 24 hr tablet TAKE 1 TABLET(150 MG) BY MOUTH DAILY   carvedilol  (COREG ) 12.5 MG tablet TAKE 1 TABLET(12.5 MG) BY MOUTH TWICE DAILY WITH A MEAL   Cholecalciferol (VITAMIN D) 50 MCG (2000 UT) CAPS 1 capsule, Daily   clonazePAM  (KLONOPIN ) 0.5 mg, Oral, 2 times daily PRN   clopidogrel  (PLAVIX ) 75 MG tablet TAKE 1 TABLET(75 MG) BY MOUTH DAILY   diltiazem  (CARDIZEM  CD) 240 MG 24 hr capsule TAKE  1 CAPSULE(240 MG) BY MOUTH DAILY   folic acid  (FOLVITE ) 1 MG tablet TAKE 2 TABLETS(2 MG) BY MOUTH DAILY   pantoprazole  (PROTONIX ) 40 mg, Oral, Daily   ramipril  (ALTACE ) 10 MG capsule TAKE 1 CAPSULE(10 MG) BY MOUTH DAILY   sildenafil  (REVATIO ) 20 MG tablet 3-5 tablets daily as needed   tamsulosin  (FLOMAX ) 0.4 MG CAPS capsule TAKE 1 CAPSULE(0.4 MG) BY MOUTH DAILY   TRINTELLIX  10  MG TABS tablet TAKE 1 TABLET(10 MG) BY MOUTH DAILY    Objective:   PHYSICAL EXAMINATION:    VITALS:  There were no vitals filed for this visit.  GEN:  The patient appears stated age and is in NAD. HEENT:  Normocephalic, atraumatic.  The mucous membranes are moist. The superficial temporal arteries are without ropiness or tenderness. CV:  RRR Lungs:  CTAB Neck/HEME:  There are no carotid bruits bilaterally.  Neurological examination:  Orientation: The patient is alert and oriented x3.  Cranial nerves: There is good facial symmetry.  Extraocular muscles are intact. The visual fields are full to confrontational testing. The speech is fluent and clear. Soft palate rises symmetrically and there is no tongue deviation. Hearing is intact to conversational tone. Sensation: Sensation is intact to light touch throughout (facial, trunk, extremities). Vibration is intact at the bilateral big toe. There is no extinction with double simultaneous stimulation.  Motor: Strength is 5/5 in the bilateral upper and lower extremities.   Shoulder shrug is equal and symmetric.  There is no pronator drift. Deep tendon reflexes: Deep tendon reflexes are 2/4 at the bilateral biceps, triceps, brachioradialis, patella and achilles. Plantar responses are downgoing bilaterally.  Movement examination: Tone: There is ***tone in the bilateral upper extremities.  The tone in the lower extremities is ***.  Abnormal movements: *** Coordination:  There is *** decremation with RAM's, *** Gait and Station: The patient has *** difficulty arising out of a deep-seated chair without the use of the hands. The patient's stride length is ***.  The patient has a *** pull test.     I have reviewed and interpreted the following labs independently   Chemistry      Component Value Date/Time   NA 139 10/23/2023 1552   NA 136 12/04/2020 1519   K 4.7 10/23/2023 1552   CL 101 10/23/2023 1552   CO2 27 10/23/2023 1552   BUN 11  10/23/2023 1552   BUN 9 12/04/2020 1519   CREATININE 0.91 10/23/2023 1552      Component Value Date/Time   CALCIUM  8.6 10/23/2023 1552   ALKPHOS 91 03/08/2021 1109   AST 18 10/23/2023 1552   ALT 12 10/23/2023 1552   BILITOT 1.0 10/23/2023 1552   BILITOT 0.5 03/08/2021 1109      Lab Results  Component Value Date   TSH 1.94 12/06/2022   Lab Results  Component Value Date   WBC 6.7 10/17/2023   HGB 13.8 10/17/2023   HCT 39.2 10/17/2023   MCV 97.3 10/17/2023   PLT 234 10/17/2023      Total time spent on today's visit was ***greater than 60 minutes, including both face-to-face time and nonface-to-face time.  Time included that spent on review of records (prior notes available to me/labs/imaging if pertinent), discussing treatment and goals, answering patient's questions and coordinating care.  Cc:  Austine Lefort, MD

## 2024-05-20 ENCOUNTER — Ambulatory Visit: Admitting: Neurology

## 2024-05-20 ENCOUNTER — Other Ambulatory Visit

## 2024-05-20 ENCOUNTER — Encounter: Payer: Self-pay | Admitting: Neurology

## 2024-05-20 VITALS — BP 176/90 | HR 97 | Ht 67.0 in | Wt 165.0 lb

## 2024-05-20 DIAGNOSIS — E538 Deficiency of other specified B group vitamins: Secondary | ICD-10-CM

## 2024-05-20 DIAGNOSIS — R9402 Abnormal brain scan: Secondary | ICD-10-CM

## 2024-05-20 DIAGNOSIS — G621 Alcoholic polyneuropathy: Secondary | ICD-10-CM

## 2024-05-20 DIAGNOSIS — R739 Hyperglycemia, unspecified: Secondary | ICD-10-CM

## 2024-05-20 NOTE — Patient Instructions (Addendum)
   Your provider has requested that you have labwork completed today. The lab is located on the Second floor at [address], within the [facility] office. When you get off the elevator, turn right and go in the [facility] [address]; the first brown door on the left.  Tell the ladies behind the desk that you are there for lab work. If you are not called within 15 minutes please check with the front desk.   Once you complete your labs you are free to go. You will receive a call or message via MyChart with your lab results.     You have been diagnosed with low B12.  We recommend that you take over the counter B12, [phone] micrograms daily.

## 2024-05-23 LAB — HEMOGLOBIN A1C
Hgb A1c MFr Bld: 5.2 % (ref <5.7)
Mean Plasma Glucose: 103 mg/dL
eAG (mmol/L): 5.7 mmol/L

## 2024-05-23 LAB — VITAMIN B1: Vitamin B1 (Thiamine): 7 nmol/L — ABNORMAL LOW (ref 8–30)

## 2024-05-24 ENCOUNTER — Ambulatory Visit: Payer: Self-pay | Admitting: Neurology

## 2024-06-10 ENCOUNTER — Ambulatory Visit: Attending: Neurology

## 2024-06-10 DIAGNOSIS — M6281 Muscle weakness (generalized): Secondary | ICD-10-CM | POA: Diagnosis not present

## 2024-06-10 DIAGNOSIS — R2681 Unsteadiness on feet: Secondary | ICD-10-CM | POA: Diagnosis not present

## 2024-06-10 DIAGNOSIS — G621 Alcoholic polyneuropathy: Secondary | ICD-10-CM | POA: Diagnosis not present

## 2024-06-10 NOTE — Therapy (Unsigned)
 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 Echo 2007: mod LVH, diast dysfn, mild MR, no LVOT obstruction;  b. 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  Rationale for Evaluation and Treatment: Rehabilitation  SUBJECTIVE:                                                                                                                                                                                             SUBJECTIVE STATEMENT: Having balance issues and notes woozy/dizziness w/ change of positions such as supine to sit and  sit to stand.  Reports in recent past he was on BP medication that lowered BP and caused syncope.  Notes when standing in showering and eyes closed he needs to support himself.  Notes generally poor balance Pt accompanied by: self  PERTINENT HISTORY: hx of lumbar surgery w/ LLE sciatica  PAIN:  Are you having pain? No  PRECAUTIONS: None  RED FLAGS: None   WEIGHT BEARING RESTRICTIONS: No  FALLS: Has patient fallen in last 6 months? Yes. Number of falls 2   LIVING ENVIRONMENT: Lives with: lives with an adult companion Lives in: House/apartment Stairs: 3 steps to enter Has following equipment at home: None  PLOF: Independent  PATIENT GOALS:   OBJECTIVE:  Note: Objective measures were completed at Evaluation unless otherwise noted.  DIAGNOSTIC FINDINGS:  per Dr. Collie notes: Medical records indicate that there have been concerns regarding his blood pressure medication causing orthostasis. There have also been concerns about alcohol contributing to symptoms. More recently, the patient went to his primary care physician and concerns about parkinsonism arose due to falls, shuffling gait, balance change and memory issues. MRI brain was done for the same symptoms in April, 2024. This demonstrated significant small vessel disease, even in the ponsHearing is intact to conversational tone. Sensation: Sensation is intact to light touch throughout (facial, trunk, extremities). Vibration is marked decreased distally. Pinprick is decreased distally.  There is no extinction with double simultaneous stimulation.    COGNITION: Overall cognitive status: Within functional limits for tasks assessed   SENSATION: Not tested  COORDINATION: WNL  EDEMA:  none  MUSCLE TONE: NT  MUSCLE LENGTH:   DTRs:    POSTURE: No Significant postural limitations  LOWER EXTREMITY ROM:     WNL  LOWER EXTREMITY MMT:    MMT Right Eval Left Eval  Hip flexion 5 5  Hip extension    Hip abduction 4 4-  Hip adduction 4 4  Hip internal rotation    Hip external rotation    Knee flexion 5 5  Knee extension 5 4  Ankle dorsiflexion 5 4  Ankle plantarflexion 3 3  Ankle inversion    Ankle eversion    (Blank rows = not tested)  BED MOBILITY:  indep  TRANSFERS: indep    CURB:  Findings: indep  STAIRS: Findings: Level of Assistance: Modified independence and Comments: unsteady w/ lateral LOB without HR GAIT: Findings: Distance walked: no issues and Comments: LLE maintained in external rotation and circumduction for stepping over obstacles---reports long standing habit from hx of LLE sciatica  FUNCTIONAL TESTS:  5 times sit to stand: 14 sec Dynamic Gait Index: Dynamic Gait Index  Mark the lowest level that applies.   Date Performed 06/10/24  Gait level surface (3) Normal: walks 20', no AD, good speed, no evidence for imbalance, normal gait pattern  2. Change in gait speed (2) Mild Impairment: Is able to change speed but demonstrates mild gait deviations, or not gait deviations but unable to achieve a significant change in velocity, or uses an assistive device  3. Gait with horizontal head turns (1) Moderate Impairment: Performs head turns with moderate change in gait velocity, slows down, staggers but recovers, can continue to walk  4. Gait with vertical head turns (2) Mild Impairment: Performs head turns smoothly with slight change in gait velocity, i.e., minor disruption to smooth gait path or uses walking aid  5. Gait and pivot turn (3) Normal: Pivot turns safely within 3 seconds and stops  quickly with no loss  of balance  6. Step over obstacle (3) Normal: Is able to step over the box without changing gait speed, no evidence of imbalance  7. Step around obstacle (3) Normal: Is able to walk around cones safely without changing gait speed; no evidence of imbalance  8. Steps (2) Mild Impairment: Alternating feet, must use rail  Total score 19/24    Score Interpretation: Score of <19 indicates high risk of falls.  Minimally Clinically Important Difference (MCID):  =DGI scores of<21/24 = 1.80 points DGI scores of >21/24 = 0.60 points   [place] T, Inbar-Borovsky N, Brozgol M, Giladi N, [place] JM. The Dynamic Gait Index in healthy older adults: the role of stair climbing, fear of falling and gender. Gait Posture. 2009 Feb;29(2):237-41. doi: 10.1016/j.gaitpost.2008.08.013. Epub 2008 Oct 8. PMID: 81154560; PMCID: EFR7290501.  Pardasaney, MYRTIS LOIS Bonus, GEANNIE POUR., et al. (2012). Sensitivity to change and responsiveness of four balance measures for community-dwelling older adults. Physical therapy 92(3): 388-397.    M-CTSIB  Condition 1: Firm Surface, EO 30 Sec, Normal Sway  Condition 2: Firm Surface, EC 30 Sec, Mild and Moderate Sway  Condition 3: Foam Surface, EO 15 Sec, Mild and Moderate Sway  Condition 4: Foam Surface, EC 15 Sec, Moderate and Severe Sway                                                                                                                                  TREATMENT DATE: 06/10/24    PATIENT EDUCATION: Education details: assessment details, HEP initiation Person educated: Patient Education method: Explanation Education comprehension: verbalized understanding  HOME EXERCISE PROGRAM: Access Code: [email or web address] URL: [email or web address] Date: 06/10/2024 Prepared by: Kelly Verniece Encarnacion  Exercises - Corner Balance Feet Together With Eyes Open  - 1 x daily - 7 x weekly - 3 sets - 30 sec hold - Corner Balance Feet Together With Eyes Closed  -  1 x daily - 7 x weekly - 3 sets - 30 sec hold - Corner Balance Feet Together: Eyes Open With Head Turns  - 1 x daily - 7 x weekly - 3 sets - 30 hold - Corner Balance Feet Together: Eyes Closed With Head Turns  - 1 x daily - 7 x weekly - 3 sets - 30 sec hold  GOALS: Goals reviewed with patient? Yes  SHORT TERM GOALS: Target date: same as LTG  LONG TERM GOALS: Target date: 07/15/24  Patient will be independent in HEP to improve functional outcomes Baseline:  Goal status: INITIAL  2.  Demo improved safety with ADL per mild sway x 30 sec condition 4 M-CTSIB Baseline: mod-severe x 15 sec Goal status: INITIAL  3.  Improve safety with ambulation and reduce fall risk per score 22/24 Dynamic Gait Index Baseline: 19/24 Goal status: INITIAL  4.  Improve LLE strength to 5/5 for single limb support to improve negotiation of obstacles Baseline: see above Goal status: INITIAL    ASSESSMENT:  CLINICAL  IMPRESSION: Patient is a 73 y.o. male who was seen today for physical therapy evaluation and treatment for neuropathy.  Exhibits LLE weakness, balance deficits, mobility impairments, and unsteadiness in gait w/ such aspects of stepping over obstacles, negotiating stairs w/out HR, ambulation w/ active head movements, and difficulty with postural stability under multisensory conditions.  Would benefit from PT services to address these deficits and limitations to improve safety with functional mobility and ADL performance   OBJECTIVE IMPAIRMENTS: Abnormal gait, decreased activity tolerance, decreased balance, decreased endurance, difficulty walking, decreased strength, and dizziness.   ACTIVITY LIMITATIONS: carrying, bending, stairs, reach over head, and locomotion level  PARTICIPATION LIMITATIONS: meal prep, cleaning, laundry, community activity, and yard work  PERSONAL FACTORS: Age, Time since onset of injury/illness/exacerbation, and 1 comorbidity: PMH are also affecting patient's functional  outcome.   REHAB POTENTIAL: Excellent  CLINICAL DECISION MAKING: Stable/uncomplicated  EVALUATION COMPLEXITY: Low  PLAN:  PT FREQUENCY: 1x/week  PT DURATION: 4 weeks  PLANNED INTERVENTIONS: 97750- Physical Performance Testing, 97110-Therapeutic exercises, 97530- Therapeutic activity, [id number]- Neuromuscular re-education, 97535- Self Care, 02859- Manual therapy, [id number]- Gait training, [id number]- Orthotic Initial, [phone]- Canalith repositioning, [id number]- Aquatic Therapy, [phone]- Electrical stimulation (unattended), and 20560 (1-2 muscles), 20561 (3+ muscles)- Dry Needling  PLAN FOR NEXT SESSION: orthostatics, HEP review and progression   7:56 AM, 06/11/24 M. Kelly Jordie Skalsky, PT, DPT Physical Therapist- [place] Office Number: [phone]

## 2024-06-11 ENCOUNTER — Other Ambulatory Visit: Payer: Self-pay

## 2024-06-18 ENCOUNTER — Encounter: Payer: Self-pay | Admitting: Physical Therapy

## 2024-06-18 ENCOUNTER — Ambulatory Visit: Admitting: Physical Therapy

## 2024-06-18 DIAGNOSIS — R2681 Unsteadiness on feet: Secondary | ICD-10-CM | POA: Diagnosis not present

## 2024-06-18 DIAGNOSIS — M6281 Muscle weakness (generalized): Secondary | ICD-10-CM

## 2024-06-18 NOTE — Therapy (Signed)
 OUTPATIENT PHYSICAL THERAPY TREATMENT   Patient Name: Walter Cross MRN: [id number] DOB:1951-04-09, 73 y.o., male Today's Date: 06/18/2024   PCP: Duanne Butler DASEN, MD REFERRING PROVIDER: Evonnie Asberry RAMAN, DO  END OF SESSION:  PT End of Session - 06/18/24 1408     Visit Number 2    Number of Visits 5    Date for PT Re-Evaluation 07/15/24    Authorization Type [employer]    PT Start Time 1405    PT Stop Time 1445    PT Time Calculation (min) 40 min    Activity Tolerance Patient tolerated treatment well           Past Medical History:  Diagnosis Date   Adenomatous colon polyp 2015    Buccini (q 5 yrs)    Barrett's esophagus    Benign colon polyp    Depression    Dermatitis    axillary   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 Echo 2007: mod LVH, diast dysfn, mild MR, no LVOT obstruction;  b. echo 9/13: upper septal thickening, no SAM of MV, no LVOT gradient, mild LVH, EF 65%   TIA (transient ischemic attack)    Vitamin D deficiency    Past Surgical History:  Procedure Laterality Date   CERVICAL SPINE SURGERY  1992-1993   nerve release, Dr Carles   SPINE SURGERY     L4-L5, [phone]   ULNAR NERVE TRANSPOSITION Right    Patient Active Problem List   Diagnosis Date Noted   Hypokalemia 10/23/2023   Macrocytosis without anemia 09/26/2020   Metabolic acidosis 09/26/2020   TIA (transient ischemic attack) 09/25/2020   Entrapment of left ulnar nerve 05/27/2020   HTN (hypertension) 10/21/2013   Insomnia 07/04/2013   Chest heaviness 09/23/2012   Chest pain 08/02/2012   Anxiety and depression 08/02/2012   Left ventricular hypertrophy 08/25/2011    ONSET DATE: a while  REFERRING DIAG:  G62.1 (ICD-10-CM) - Alcohol-induced  polyneuropathy (HCC)    THERAPY DIAG:  Unsteadiness on feet  Muscle weakness (generalized)  Rationale for Evaluation and Treatment: Rehabilitation  SUBJECTIVE:                                                                                                                                                                                             SUBJECTIVE STATEMENT: Pt reports no  falls since last visit. Has been working on his exercises and is noting some improvement. Still gets some woozy feelings.   From eval: Having balance issues and notes woozy/dizziness w/ change of positions such as supine to sit and sit to stand.  Reports in recent past he was on BP medication that lowered BP and caused syncope.  Notes when standing in showering and eyes closed he needs to support himself.  Notes generally poor balance Pt accompanied by: self  PERTINENT HISTORY: hx of lumbar surgery w/ LLE sciatica  PAIN:  Are you having pain? No  PRECAUTIONS: None  WEIGHT BEARING RESTRICTIONS: No  FALLS: Has patient fallen in last 6 months? Yes. Number of falls 2   LIVING ENVIRONMENT: Lives with: lives with an adult companion Lives in: House/apartment Stairs: 3 steps to enter Has following equipment at home: None  PATIENT GOALS: Improve balance  OBJECTIVE:  Note: Objective measures were completed at Evaluation unless otherwise noted.  DIAGNOSTIC FINDINGS:  per Dr. Collie notes: Medical records indicate that there have been concerns regarding his blood pressure medication causing orthostasis. There have also been concerns about alcohol contributing to symptoms. More recently, the patient went to his primary care physician and concerns about parkinsonism arose due to falls, shuffling gait, balance change and memory issues. MRI brain was done for the same symptoms in April, 2024. This demonstrated significant small vessel disease, even in the ponsHearing is intact to conversational tone. Sensation:  Sensation is intact to light touch throughout (facial, trunk, extremities). Vibration is marked decreased distally. Pinprick is decreased distally.  There is no extinction with double simultaneous stimulation.   LOWER EXTREMITY ROM:    WNL  LOWER EXTREMITY MMT:    MMT Right Eval Left Eval  Hip flexion 5 5  Hip extension    Hip abduction 4 4-  Hip adduction 4 4  Hip internal rotation    Hip external rotation    Knee flexion 5 5  Knee extension 5 4  Ankle dorsiflexion 5 4  Ankle plantarflexion 3 3  Ankle inversion    Ankle eversion    (Blank rows = not tested)  STAIRS: Findings: Level of Assistance: Modified independence and Comments: unsteady w/ lateral LOB without HR GAIT: Findings: Distance walked: no issues and Comments: LLE maintained in external rotation and circumduction for stepping over obstacles---reports long standing habit from hx of LLE sciatica  FUNCTIONAL TESTS:  5 times sit to stand: 14 sec Dynamic Gait Index: Dynamic Gait Index  Mark the lowest level that applies.   Date Performed 06/10/24  Gait level surface (3) Normal: walks 20', no AD, good speed, no evidence for imbalance, normal gait pattern  2. Change in gait speed (2) Mild Impairment: Is able to change speed but demonstrates mild gait deviations, or not gait deviations but unable to achieve a significant change in velocity, or uses an assistive device  3. Gait with horizontal head turns (1) Moderate Impairment: Performs head turns with moderate change in gait velocity, slows down, staggers but recovers, can continue to walk  4. Gait with vertical head turns (2) Mild Impairment: Performs head turns smoothly with slight change in gait velocity, i.e., minor disruption to smooth gait path or uses walking aid  5. Gait and pivot turn (3) Normal: Pivot turns safely within 3 seconds and stops quickly with no loss of balance  6. Step over obstacle (3) Normal: Is able to step over the box without changing gait  speed, no evidence of imbalance  7. Step around obstacle (3) Normal: Is able to walk around cones safely without changing gait speed; no evidence of imbalance  8. Steps (2) Mild Impairment: Alternating feet, must use rail  Total score 19/24    Score Interpretation: Score of <19 indicates high risk of falls.  Minimally Clinically Important Difference (MCID):  =DGI scores of<21/24 = 1.80 points DGI scores of >21/24 = 0.60 points   [place] T, Inbar-Borovsky N, Brozgol M, Giladi N, [place] JM. The Dynamic Gait Index in healthy older adults: the role of stair climbing, fear of falling and gender. Gait Posture. 2009 Feb;29(2):237-41. doi: 10.1016/j.gaitpost.2008.08.013. Epub 2008 Oct 8. PMID: 81154560; PMCID: [id number].  Pardasaney, MYRTIS LOIS Bonus, GEANNIE POUR., et al. (2012). Sensitivity to change and responsiveness of four balance measures for community-dwelling older adults. Physical therapy 92(3): 388-397.    M-CTSIB  Condition 1: Firm Surface, EO 30 Sec, Normal Sway  Condition 2: Firm Surface, EC 30 Sec, Mild and Moderate Sway  Condition 3: Foam Surface, EO 15 Sec, Mild and Moderate Sway  Condition 4: Foam Surface, EC 15 Sec, Moderate and Severe Sway                                                                                                                                  TREATMENT DATE: 06/18/2024  Activity Comments  Nustep L5 x 8 min UEs/LEs Maintaining 55 SPM. Performed for neural priming  In // bars: Heel raise 10x5 holds Side step red TB x 3 laps Fwd/bwd monster walk red TB x 3 laps Intermittent UE support  Standing in corner:  Feet together EO x30 Feet together EO on foam 3x30 Feet together EC x30 Feet together EO head turns/head nods 2x30 Intermittent UE support  Multiple posterior LOBs  Cues to keep weight forward towards the ball of his feet     PATIENT EDUCATION: Education details: HEP updates Person educated: Patient Education method:  Explanation Education comprehension: verbalized understanding  HOME EXERCISE PROGRAM: Access Code: [email or web address] URL: [email or web address] Date: 06/18/2024 Prepared by: Eero Dini April Earnie Starring  Exercises - Corner Balance Feet Together With Eyes Closed  - 1 x daily - 7 x weekly - 3 sets - 30 sec hold - Corner Balance Feet Together: Eyes Open With Head Turns  - 1 x daily - 7 x weekly - 3 sets - 30 hold - Corner Balance Feet Together: Eyes Closed With Head Turns  - 1 x daily - 7 x weekly - 3 sets - 30 sec hold - Heel Toe Raises with Unilateral Counter Support  - 1 x daily - 7 x weekly - 3 sets - 10 reps - Side Stepping with Resistance at Ankles and Counter Support  - 1 x daily - 7 x weekly - 2 sets - 10 reps - Backward Monster Walk with Resistance at Ankles and Counter Support  - 1 x daily - 7 x weekly - 2 sets - 10 reps -  Romberg Stance on Foam Pad  - 1 x daily - 7 x weekly - 3 sets - 30 sec hold  GOALS: Goals reviewed with patient? Yes  SHORT TERM GOALS: Target date: same as LTG  LONG TERM GOALS: Target date: 07/15/24  Patient will be independent in HEP to improve functional outcomes Baseline:  Goal status: INITIAL  2.  Demo improved safety with ADL per mild sway x 30 sec condition 4 M-CTSIB Baseline: mod-severe x 15 sec Goal status: INITIAL  3.  Improve safety with ambulation and reduce fall risk per score 22/24 Dynamic Gait Index Baseline: 19/24 Goal status: INITIAL  4.  Improve LLE strength to 5/5 for single limb support to improve negotiation of obstacles Baseline: see above Goal status: INITIAL    ASSESSMENT:  CLINICAL IMPRESSION: Initiated bilat LE strength/dynamic balance exercises this session. Continues to be challenged with reduced vision or standing on soft surfaces. Has multiple posterior LOBs while performing corner exercises.   From eval: Patient is a 73 y.o. male who was seen today for physical therapy evaluation and treatment for neuropathy.   Exhibits LLE weakness, balance deficits, mobility impairments, and unsteadiness in gait w/ such aspects of stepping over obstacles, negotiating stairs w/out HR, ambulation w/ active head movements, and difficulty with postural stability under multisensory conditions.  Would benefit from PT services to address these deficits and limitations to improve safety with functional mobility and ADL performance   OBJECTIVE IMPAIRMENTS: Abnormal gait, decreased activity tolerance, decreased balance, decreased endurance, difficulty walking, decreased strength, and dizziness.    PLAN:  PT FREQUENCY: 1x/week  PT DURATION: 4 weeks  PLANNED INTERVENTIONS: 97750- Physical Performance Testing, 97110-Therapeutic exercises, 97530- Therapeutic activity, [id number]- Neuromuscular re-education, 97535- Self Care, 02859- Manual therapy, [id number]- Gait training, [phone]- Orthotic Initial, [phone]- Canalith repositioning, [id number]- Aquatic Therapy, [phone]- Electrical stimulation (unattended), and 20560 (1-2 muscles), 20561 (3+ muscles)- Dry Needling  PLAN FOR NEXT SESSION: orthostatics, HEP review and progression   2:09 PM, 06/18/24 Delesia Martinek April Ma L Iva Montelongo, PT, DPT Physical Therapist- [place] Office Number: [phone]

## 2024-06-24 ENCOUNTER — Ambulatory Visit

## 2024-06-24 DIAGNOSIS — M6281 Muscle weakness (generalized): Secondary | ICD-10-CM

## 2024-06-24 DIAGNOSIS — R2681 Unsteadiness on feet: Secondary | ICD-10-CM

## 2024-06-24 NOTE — Therapy (Signed)
 OUTPATIENT PHYSICAL THERAPY TREATMENT   Patient Name: Walter Cross MRN: [id number] DOB:September 02, 1951, 73 y.o., male Today's Date: 06/24/2024   PCP: Duanne Butler DASEN, MD REFERRING PROVIDER: Evonnie Asberry RAMAN, DO  END OF SESSION:  PT End of Session - 06/24/24 1409     Visit Number 3    Number of Visits 5    Date for PT Re-Evaluation 07/15/24    Authorization Type [employer]    PT Start Time 1408   late arrival   PT Stop Time 1445    PT Time Calculation (min) 37 min    Activity Tolerance Patient tolerated treatment well           Past Medical History:  Diagnosis Date   Adenomatous colon polyp 2015    Buccini (q 5 yrs)    Barrett's esophagus    Benign colon polyp    Depression    Dermatitis    axillary   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 Echo 2007: mod LVH, diast dysfn, mild MR, no LVOT obstruction;  b. echo 9/13: upper septal thickening, no SAM of MV, no LVOT gradient, mild LVH, EF 65%   TIA (transient ischemic attack)    Vitamin D deficiency    Past Surgical History:  Procedure Laterality Date   CERVICAL SPINE SURGERY  1992-1993   nerve release, Dr Carles   SPINE SURGERY     L4-L5, [phone]   ULNAR NERVE TRANSPOSITION Right    Patient Active Problem List   Diagnosis Date Noted   Hypokalemia 10/23/2023   Macrocytosis without anemia 09/26/2020   Metabolic acidosis 09/26/2020   TIA (transient ischemic attack) 09/25/2020   Entrapment of left ulnar nerve 05/27/2020   HTN (hypertension) 10/21/2013   Insomnia 07/04/2013   Chest heaviness 09/23/2012   Chest pain 08/02/2012   Anxiety and depression 08/02/2012   Left ventricular hypertrophy 08/25/2011    ONSET DATE: a while  REFERRING DIAG:  G62.1 (ICD-10-CM) -  Alcohol-induced polyneuropathy (HCC)    THERAPY DIAG:  Unsteadiness on feet  Muscle weakness (generalized)  Rationale for Evaluation and Treatment: Rehabilitation  SUBJECTIVE:                                                                                                                                                                                             SUBJECTIVE STATEMENT:  Still getting lightheaded/dizzy with change of position  From eval: Having balance issues and notes woozy/dizziness w/ change of positions such as supine to sit and sit to stand.  Reports in recent past he was on BP medication that lowered BP and caused syncope.  Notes when standing in showering and eyes closed he needs to support himself.  Notes generally poor balance Pt accompanied by: self  PERTINENT HISTORY: hx of lumbar surgery w/ LLE sciatica  PAIN:  Are you having pain? No  PRECAUTIONS: None  WEIGHT BEARING RESTRICTIONS: No  FALLS: Has patient fallen in last 6 months? Yes. Number of falls 2   LIVING ENVIRONMENT: Lives with: lives with an adult companion Lives in: House/apartment Stairs: 3 steps to enter Has following equipment at home: None  PATIENT GOALS: Improve balance  OBJECTIVE:    TODAY'S TREATMENT: 06/24/24 Activity Comments  Orthostatics: Supine x 5 min: 188/105 mmHg, 77 bpm Standing x 1 min: 163/102 mmHg, 89 bpm Standing x 3 min: 155/97, 88 bpm  Static balance -Feet together EO/EC on firm x 30 sec--mild/mod sway eyes closed -feet together EO/EC on foam: UE support for eyes closed -staggered stance x 30 sec on foam -postural perturbations x 60 sec standing on foam  Forwards tandem/retro walk W. UE support  Monster walk x 2 min W/ red loop  Resisted march 2x10  Red loop        TREATMENT DATE:   Activity Comments  Nustep L5 x 8 min UEs/LEs Maintaining 55 SPM. Performed for neural priming  In // bars: Heel raise 10x5 holds Side step red TB x 3 laps Fwd/bwd monster  walk red TB x 3 laps Intermittent UE support  Standing in corner:  Feet together EO x30 Feet together EO on foam 3x30 Feet together EC x30 Feet together EO head turns/head nods 2x30 Intermittent UE support  Multiple posterior LOBs  Cues to keep weight forward towards the ball of his feet   PATIENT EDUCATION: Education details: HEP updates Person educated: Patient Education method: Explanation Education comprehension: verbalized understanding  HOME EXERCISE PROGRAM: Access Code: [email or web address] URL: [email or web address] Date: 06/18/2024 Prepared by: Gellen April Earnie Starring  Exercises - Corner Balance Feet Together With Eyes Closed  - 1 x daily - 7 x weekly - 3 sets - 30 sec hold - Corner Balance Feet Together: Eyes Open With Head Turns  - 1 x daily - 7 x weekly - 3 sets - 30 hold - Corner Balance Feet Together: Eyes Closed With Head Turns  - 1 x daily - 7 x weekly - 3 sets - 30 sec hold - Heel Toe Raises with Unilateral Counter Support  - 1 x daily - 7 x weekly - 3 sets - 10 reps - Side Stepping with Resistance at Ankles and Counter Support  - 1 x daily - 7 x weekly - 2 sets - 10 reps - Backward Monster Walk with Resistance at Ankles and Counter Support  - 1 x daily - 7 x weekly - 2 sets - 10 reps - Romberg Stance on Foam Pad  - 1 x daily - 7 x weekly - 3 sets - 30 sec hold - Tandem Walking with Counter Support  - 1 x daily - 7 x weekly - 1-2 sets - 2 min hold - Backward Walking with Counter Support  - 1 x daily - 7 x weekly - 1-2 sets - 2 min rounds hold - Marching with Resistance  - 1 x daily - 7 x weekly -  3 sets - 10 reps  Note: Objective measures were completed at Evaluation unless otherwise noted.  DIAGNOSTIC FINDINGS:  per Dr. Collie notes: Medical records indicate that there have been concerns regarding his blood pressure medication causing orthostasis. There have also been concerns about alcohol contributing to symptoms. More recently, the patient went to  his primary care physician and concerns about parkinsonism arose due to falls, shuffling gait, balance change and memory issues. MRI brain was done for the same symptoms in April, 2024. This demonstrated significant small vessel disease, even in the ponsHearing is intact to conversational tone. Sensation: Sensation is intact to light touch throughout (facial, trunk, extremities). Vibration is marked decreased distally. Pinprick is decreased distally.  There is no extinction with double simultaneous stimulation.   LOWER EXTREMITY ROM:    WNL  LOWER EXTREMITY MMT:    MMT Right Eval Left Eval  Hip flexion 5 5  Hip extension    Hip abduction 4 4-  Hip adduction 4 4  Hip internal rotation    Hip external rotation    Knee flexion 5 5  Knee extension 5 4  Ankle dorsiflexion 5 4  Ankle plantarflexion 3 3  Ankle inversion    Ankle eversion    (Blank rows = not tested)  STAIRS: Findings: Level of Assistance: Modified independence and Comments: unsteady w/ lateral LOB without HR GAIT: Findings: Distance walked: no issues and Comments: LLE maintained in external rotation and circumduction for stepping over obstacles---reports long standing habit from hx of LLE sciatica  FUNCTIONAL TESTS:  5 times sit to stand: 14 sec Dynamic Gait Index: Dynamic Gait Index  Mark the lowest level that applies.   Date Performed 06/10/24  Gait level surface (3) Normal: walks 20', no AD, good speed, no evidence for imbalance, normal gait pattern  2. Change in gait speed (2) Mild Impairment: Is able to change speed but demonstrates mild gait deviations, or not gait deviations but unable to achieve a significant change in velocity, or uses an assistive device  3. Gait with horizontal head turns (1) Moderate Impairment: Performs head turns with moderate change in gait velocity, slows down, staggers but recovers, can continue to walk  4. Gait with vertical head turns (2) Mild Impairment: Performs head turns smoothly  with slight change in gait velocity, i.e., minor disruption to smooth gait path or uses walking aid  5. Gait and pivot turn (3) Normal: Pivot turns safely within 3 seconds and stops quickly with no loss of balance  6. Step over obstacle (3) Normal: Is able to step over the box without changing gait speed, no evidence of imbalance  7. Step around obstacle (3) Normal: Is able to walk around cones safely without changing gait speed; no evidence of imbalance  8. Steps (2) Mild Impairment: Alternating feet, must use rail  Total score 19/24    Score Interpretation: Score of <19 indicates high risk of falls.  Minimally Clinically Important Difference (MCID):  =DGI scores of<21/24 = 1.80 points DGI scores of >21/24 = 0.60 points   Fox T, Inbar-Borovsky N, Brozgol M, Giladi N, [place] JM. The Dynamic Gait Index in healthy older adults: the role of stair climbing, fear of falling and gender. Gait Posture. 2009 Feb;29(2):237-41. doi: 10.1016/j.gaitpost.2008.08.013. Epub 2008 Oct 8. PMID: 81154560; PMCID: EFR7290501.  Pardasaney, MYRTIS LOIS Bonus, GEANNIE POUR., et al. (2012). Sensitivity to change and responsiveness of four balance measures for community-dwelling older adults. Physical therapy 92(3): 388-397.    M-CTSIB  Condition 1: Firm  Surface, EO 30 Sec, Normal Sway  Condition 2: Firm Surface, EC 30 Sec, Mild and Moderate Sway  Condition 3: Foam Surface, EO 15 Sec, Mild and Moderate Sway  Condition 4: Foam Surface, EC 15 Sec, Moderate and Severe Sway                                                                                                                                        GOALS: Goals reviewed with patient? Yes  SHORT TERM GOALS: Target date: same as LTG  LONG TERM GOALS: Target date: 07/15/24  Patient will be independent in HEP to improve functional outcomes Baseline:  Goal status: INITIAL  2.  Demo improved safety with ADL per mild sway x 30 sec condition 4  M-CTSIB Baseline: mod-severe x 15 sec Goal status: INITIAL  3.  Improve safety with ambulation and reduce fall risk per score 22/24 Dynamic Gait Index Baseline: 19/24 Goal status: INITIAL  4.  Improve LLE strength to 5/5 for single limb support to improve negotiation of obstacles Baseline: see above Goal status: INITIAL    ASSESSMENT:  CLINICAL IMPRESSION: Orthostatic hypotension assessment due to c/o lightheaded w/ position change. Vitals reveal elevated BP and there is a drop to pressure w/ positional change but notes no feeling of lightheaded w/ position. HEP review w/ good tolerance and recall to activity. Continued w/ dynamic balance challenges to facilitate righting reactions and improve single limb support  From eval: Patient is a 73 y.o. male who was seen today for physical therapy evaluation and treatment for neuropathy.  Exhibits LLE weakness, balance deficits, mobility impairments, and unsteadiness in gait w/ such aspects of stepping over obstacles, negotiating stairs w/out HR, ambulation w/ active head movements, and difficulty with postural stability under multisensory conditions.  Would benefit from PT services to address these deficits and limitations to improve safety with functional mobility and ADL performance   OBJECTIVE IMPAIRMENTS: Abnormal gait, decreased activity tolerance, decreased balance, decreased endurance, difficulty walking, decreased strength, and dizziness.    PLAN:  PT FREQUENCY: 1x/week  PT DURATION: 4 weeks  PLANNED INTERVENTIONS: 97750- Physical Performance Testing, 97110-Therapeutic exercises, 97530- Therapeutic activity, [id number]- Neuromuscular re-education, 97535- Self Care, 02859- Manual therapy, [phone]- Gait training, [phone]- Orthotic Initial, [phone]- Canalith repositioning, [id number]- Aquatic Therapy, [phone]- Electrical stimulation (unattended), and 20560 (1-2 muscles), 20561 (3+ muscles)- Dry Needling  PLAN FOR NEXT SESSION: orthostatics, HEP review  and progression   2:09 PM, 06/24/24 Jonette MARLA Sandifer, PT, DPT Physical Therapist- [place] Office Number: [phone]

## 2024-06-25 ENCOUNTER — Encounter: Payer: Self-pay | Admitting: Family Medicine

## 2024-07-01 ENCOUNTER — Ambulatory Visit: Attending: Cardiovascular Disease | Admitting: Pharmacist

## 2024-07-01 VITALS — BP 152/82 | HR 70

## 2024-07-01 DIAGNOSIS — I10 Essential (primary) hypertension: Secondary | ICD-10-CM

## 2024-07-01 MED ORDER — IRBESARTAN 300 MG PO TABS: 300.0000 mg | ORAL_TABLET | Freq: Every day | ORAL | 3 refills | Status: AC

## 2024-07-01 NOTE — Assessment & Plan Note (Signed)
 Assessment: Blood pressure in clinic and at home above goal of less than 130/80 Patient reports being on ramipril  for quite some time now He has increased his physical activity slightly  Still drinking about 3 drinks per night Willing to make an adjustment to his ramipril , previously unwilling to make medication changes  Plan: Stop ramipril  Start irbesartan  300 mg daily Continue carvedilol  12.5 mg twice a day and diltiazem  240 mg daily Follow-up in 4 to 6 weeks Will need BMP at next visit Encouraged him to decrease his alcohol intake and increase his physical activity

## 2024-07-01 NOTE — Patient Instructions (Addendum)
 Your blood pressure goal is < 130/45mmHg   Please decrease alcohol intake Increase exercise STOP ramipril  START irbesartan  300mg  daily Continue carvedilol  12.5mg  twice a day, diltiazem  240mg  daily  Please checking blood pressure at home. Please write down the readings and bring them with you to your next appointment.  Important lifestyle changes to control high blood pressure  Intervention  Effect on the BP   Weight loss Weight loss is one of the most effective lifestyle changes for controlling blood pressure. If you're overweight or obese, losing even a small amount of weight can help reduce blood pressure.    Blood pressure can decrease by 1 millimeter of mercury (mmHg) with each kilogram (about 2.2 pounds) of weight lost.   Exercise regularly As a general goal, aim for 30 minutes of moderate physical activity every day.    Regular physical activity can lower blood pressure by 5 - 8 mmHg.   Eat a healthy diet Eat a diet rich in whole grains, fruits, vegetables, lean meat, and low-fat dairy products. Limit processed foods, saturated fat, and sweets.    A heart-healthy diet can lower high blood pressure by 10 mmHg.   Reduce salt (sodium) in your diet Aim for 000mg  of sodium each day. Avoid deli meats, canned food, and frozen microwave meals which are high in sodium.     Limiting sodium can reduce blood pressure by 5 mmHg.   Limit alcohol One drink equals 12 ounces of beer, 5 ounces of wine, or 1.5 ounces of 80-proof liquor.    Limiting alcohol to < 1 drink a day for women or < 2 drinks a day for men can help lower blood pressure by about 4 mmHg.   To check your pressure at home you will need to:   Sit up in a chair, with feet flat on the floor and back supported. Do not cross your ankles or legs. Rest your left arm so that the cuff is about heart level. If the cuff goes on your upper arm, then just relax your arm on the table, arm of the chair, or your lap. If you have  a wrist cuff, hold your wrist against your chest at heart level. Place the cuff snugly around your arm, about 1 inch above the crease of your elbow. The cords should be inside the groove of your elbow.  Sit quietly, with the cuff in place, for about 5 minutes. Then press the power button to start a reading. Do not talk or move while the reading is taking place.  Record your readings on a sheet of paper. Although most cuffs have a memory, it is often easier to see a pattern developing when the numbers are all in front of you.  You can repeat the reading after 1-3 minutes if it is recommended.   Make sure your bladder is empty and you have not had caffeine or tobacco within the last 30 minutes   Always bring your blood pressure log with you to your appointments. If you have not brought your monitor in to be double checked for accuracy, please bring it to your next appointment.   You can find a list of validated (accurate) blood pressure cuffs at: validatebp.org

## 2024-07-01 NOTE — Progress Notes (Signed)
 Patient ID: Walter Cross                 DOB: [birth year]                      MRN: [id number]      HPI: VON QUINTANAR is a 73 y.o. male referred by Artist Pouch, PA to HTN clinic. PMH is significant for HTN, HLD, TIA and LVH.   He was started on a new antihypertensive medication by Dr. Duanne, hydrochlorothiazide  due to significant hypertension noted in clinic. However, after just a few doses, patient experienced an episode of syncope prompting ED evaluation This episode lasted approximately 10-15 minutes and required paramedic intervention. ED evaluation noted hypokalemia. Hydrochlorothiazide  was discontinued and patient started on short course of K supplementation. Repeat labs following these changes show normalization of K.   Seen by Artist Pouch 11/06/23. BP was 148/74 in clinic. Patient reported BP at home in the 150-170's in the evening. Diltiazem  was increased to 240mg .   Patient seen in Pharm.D. clinic 12/12/2023.  His home blood pressures per memory were between 140 and 150 over 70s heart rate 80s to 90s.  Blood pressure in clinic that day 132/70.  Clinic readings and home readings do not correlate.  I asked him to purchase new blood pressure cuff and cut back on alcohol and increase his physical activity.  No medication changes were made that day.  I last saw patient 12/29/2023.  Blood pressure in clinic was 150/82.  Patient had not increased his exercise but reported cutting back on alcohol.  Medication changes were recommended but patient refused preferring instead to work on lifestyle.    Blood pressure at appointment 01/25/2024 with Artist was 122/68. However at visit with me 02/14/24 BP was 140/80. Home readings were variable. Home cuff found to be accurate. He refused medication changes. Strongly encouraged increase in physical activity.   I last saw patient 04/11/24. He had been exercising twice a week. Struggling with depression and unstreadiness. Encouraged him to see PCP.  Blood pressure has been improving and patient did not want to make changes. He was encouraged to increase physical activity. He saw neurology who diagnosed him with neuropathy, probably from alcohol use. He was referred to PT.   Patient presents today for follow-up.  Reports his home blood pressures mainly in the 140s over 80s and 90s.  He does not bring any readings with him.  Still drinking about 3 alcoholic beverages daily.  Counseled him on the importance of cutting back.  Reviewed the negative effects of alcohol. Patient has occasional dizziness upon standing.  Denies anyheadache, blurred vision, SOB, swelling.   Has increased his walking from 2 days a week to about 3 days a week.  Participating in physical therapy for balance.  Current HTN meds: carvedilol  12.5mg  twice a day, diltiazem  240mg  daily, ramipril  10mg  daily Previously tried: hydrochlorothiazide  (hypokalemia, syncope) BP goal: <130/80  Family History:  Family History  Problem Relation Age of Onset   Cancer Mother 109       cervical   Cancer Father        lung   Stroke Brother    Atrial fibrillation Other     Social History: 2 beers almost everyday, sometimes a class of wine, no tobacco  Diet: 1 cup half calf, tries to avoid salt  Exercise:  Walking 20 min three times a week  Home BP readings: 140's mostly/80-90's   Wt Readings from Last  3 Encounters:  05/20/24 165 lb (74.8 kg)  04/18/24 168 lb (76.2 kg)  01/25/24 168 lb 6.4 oz (76.4 kg)   BP Readings from Last 3 Encounters:  07/01/24 (!) 152/82  05/20/24 (!) 176/90  04/18/24 138/72   Pulse Readings from Last 3 Encounters:  07/01/24 70  05/20/24 97  04/18/24 68    Renal function: CrCl cannot be calculated (Patient's most recent lab result is older than the maximum 21 days allowed.).  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 Echo 2007: mod LVH, diast dysfn, mild MR, no LVOT obstruction;  b. echo 9/13: upper septal thickening, no SAM of MV, no LVOT gradient, mild LVH, EF 65%   TIA (transient ischemic attack)    Vitamin D deficiency     Current Outpatient Medications on File Prior to Visit  Medication Sig Dispense Refill   atorvastatin  (LIPITOR) 40 MG tablet TAKE 1 TABLET(40 MG) BY MOUTH DAILY 90 tablet 3   buPROPion  (WELLBUTRIN  XL) 150 MG 24 hr tablet TAKE 1 TABLET(150 MG) BY MOUTH DAILY 90 tablet 0   carvedilol  (COREG ) 12.5 MG tablet TAKE 1 TABLET(12.5 MG) BY MOUTH TWICE DAILY WITH A MEAL 180 tablet 3   Cholecalciferol (VITAMIN D) 50 MCG (2000 UT) CAPS Take 1 capsule by mouth daily in the afternoon.     clonazePAM  (KLONOPIN ) 0.5 MG tablet Take 1 tablet (0.5 mg total) by mouth 2 (two) times daily as needed for anxiety. 30 tablet 1   clopidogrel  (PLAVIX ) 75 MG tablet TAKE 1 TABLET(75 MG) BY MOUTH DAILY 90 tablet 0   diltiazem  (CARDIZEM  CD) 240 MG 24 hr capsule TAKE 1 CAPSULE(240 MG) BY MOUTH DAILY 90 capsule 3   folic acid  (FOLVITE ) 1 MG tablet TAKE 2 TABLETS(2 MG) BY MOUTH DAILY 180 tablet 0   pantoprazole  (PROTONIX ) 40 MG tablet Take 1 tablet (40 mg total) by mouth daily. 90 tablet 1   tamsulosin  (FLOMAX ) 0.4 MG CAPS capsule TAKE 1 CAPSULE(0.4 MG) BY MOUTH DAILY 90 capsule 3   TRINTELLIX  10 MG TABS tablet TAKE 1 TABLET(10 MG) BY MOUTH DAILY 90 tablet 3   acyclovir  ointment (ZOVIRAX ) 5 % Apply 1 application topically daily as needed (cold sores & herpes). 15 g 1   sildenafil  (REVATIO ) 20 MG tablet 3-5 tablets daily as needed 50 tablet 6   No current facility-administered medications on file prior to visit.    Allergies  Allergen Reactions    Sulfa Antibiotics     Pt reports causes aches all over   Esomeprazole Magnesium Other (See Comments)    cramps   Septra [Bactrim] Other (See Comments)    Back ache   Sulfamethoxazole-Trimethoprim     Other reaction(s): Other (See Comments) Back ache Back ache Back ache     Blood pressure (!) 152/82, pulse 70.   Assessment/Plan: HYPERTENSION CONTROL Vitals:   07/01/24 1425 07/01/24 1426  BP: (!) 160/90 (!) 152/82    The patient's blood pressure is elevated above  target today.  In order to address the patient's elevated BP: A new medication was prescribed today.      1. Hypertension -  HTN (hypertension) Assessment: Blood pressure in clinic and at home above goal of less than 130/80 Patient reports being on ramipril  for quite some time now He has increased his physical activity slightly  Still drinking about 3 drinks per night Willing to make an adjustment to his ramipril , previously unwilling to make medication changes  Plan: Stop ramipril  Start irbesartan  300 mg daily Continue carvedilol  12.5 mg twice a day and diltiazem  240 mg daily Follow-up in 4 to 6 weeks Will need BMP at next visit Encouraged him to decrease his alcohol intake and increase his physical activity    Thank you  Chenae Brager D Syenna Nazir, Pharm.JONETTA SARAN, CPP [facility] of [facility] [address], [place], [place] [address]  Phone: [phone]; Fax: [phone]

## 2024-07-02 ENCOUNTER — Ambulatory Visit: Attending: Neurology | Admitting: Physical Therapy

## 2024-07-02 DIAGNOSIS — M6281 Muscle weakness (generalized): Secondary | ICD-10-CM | POA: Insufficient documentation

## 2024-07-02 DIAGNOSIS — R2681 Unsteadiness on feet: Secondary | ICD-10-CM | POA: Diagnosis not present

## 2024-07-02 NOTE — Therapy (Signed)
 OUTPATIENT PHYSICAL THERAPY TREATMENT   Patient Name: Walter Cross MRN: [id number] DOB:1951-03-18, 73 y.o., male Today's Date: 07/02/2024   PCP: Duanne Butler DASEN, MD REFERRING PROVIDER: Evonnie Asberry RAMAN, DO  END OF SESSION:  PT End of Session - 07/02/24 1406     Visit Number 4    Number of Visits 5    Date for PT Re-Evaluation 07/15/24    Authorization Type [employer]    PT Start Time 1407    PT Stop Time 1446    PT Time Calculation (min) 39 min    Equipment Utilized During Treatment Gait belt    Activity Tolerance Patient tolerated treatment well    Behavior During Therapy WFL for tasks assessed/performed            Past Medical History:  Diagnosis Date   Adenomatous colon polyp 2015    Buccini (q 5 yrs)    Barrett's esophagus    Benign colon polyp    Depression    Dermatitis    axillary   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 Echo 2007: mod LVH, diast dysfn, mild MR, no LVOT obstruction;  b. 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  Saw cardiologist last week and he is going to change the medication (to Altace ?)  From eval: Having balance issues and notes woozy/dizziness w/ change of positions such as supine to sit and sit to stand.  Reports in recent past he was on BP medication that lowered BP and caused syncope.  Notes when standing in showering and eyes closed he needs to support himself.  Notes generally poor balance Pt accompanied by: self  PERTINENT HISTORY: hx of lumbar surgery w/ LLE sciatica  PAIN:  Are you having pain? No  PRECAUTIONS: None  WEIGHT BEARING RESTRICTIONS: No  FALLS: Has patient fallen in last 6 months? Yes. Number of falls 2   LIVING ENVIRONMENT: Lives with: lives with an adult companion Lives in: House/apartment Stairs: 3 steps to enter Has following equipment at home: None  PATIENT GOALS: Improve balance  OBJECTIVE:    TODAY'S TREATMENT: 07/02/2024 Activity Comments  Vitals:  153/94, HR 77 bpm sitting  Review of HEP: Backwards walking Tandem gait Resisted monster walk, resisted march in place Good return demo  Cues to look ahead at target  Feet together EO/EC on firm x 30 sec--mild/mod sway eyes closed -feet together EO/EC on foam: UE support for eyes closed    On Airex:Heel toe raises x 10 Alternating step taps to cones x 10 Alternating step taps to floor x 10 Forward/back step and weigthshift x 10 Side step and weightshift x 10 UE support, cues for  increased foot clearance  Forward step over obstacle Side step over obstacle 2# ankle weights  Sit to stand, 3 x 5 reps, 2.2 weighted ball lift overhead Cues for increased forward lean      PATIENT EDUCATION: Education details: Continue current HEP Person educated: Patient Education method: Explanation Education comprehension: verbalized understanding  HOME EXERCISE PROGRAM: Access Code: [email or web address] URL: [email or web address] Date: 06/18/2024 Prepared by: Gellen April Earnie Starring  Exercises - Corner Balance Feet Together With Eyes Closed  - 1 x daily - 7 x weekly - 3 sets - 30 sec hold - Corner Balance Feet Together: Eyes Open With Head Turns  - 1 x daily - 7 x weekly - 3 sets - 30 hold - Corner Balance Feet Together: Eyes Closed With Head Turns  - 1 x daily - 7 x weekly - 3 sets - 30 sec hold - Heel Toe Raises with Unilateral Counter Support  - 1 x daily - 7 x weekly - 3 sets - 10 reps - Side Stepping with Resistance at Ankles and Counter Support  - 1 x daily - 7 x weekly - 2 sets - 10 reps - Backward Monster Walk with Resistance at Ankles and Counter Support  - 1 x daily - 7 x weekly - 2 sets - 10 reps - Romberg Stance on Foam Pad  - 1 x daily - 7 x weekly - 3 sets - 30 sec hold - Tandem Walking with Counter Support  - 1 x daily - 7 x weekly - 1-2 sets - 2 min hold - Backward Walking with Counter Support  - 1 x daily - 7 x weekly - 1-2 sets - 2 min rounds hold - Marching with Resistance  - 1 x daily - 7 x weekly - 3 sets - 10 reps  Note: Objective measures were completed at Evaluation unless otherwise noted.  DIAGNOSTIC FINDINGS:  per Dr. Collie notes: Medical records indicate that there have been concerns regarding his blood pressure medication  causing orthostasis. There have also been concerns about alcohol contributing to symptoms. More recently, the patient went to his primary care physician and concerns about parkinsonism arose due to falls, shuffling gait,  balance change and memory issues. MRI brain was done for the same symptoms in April, 2024. This demonstrated significant small vessel disease, even in the ponsHearing is intact to conversational tone. Sensation: Sensation is intact to light touch throughout (facial, trunk, extremities). Vibration is marked decreased distally. Pinprick is decreased distally.  There is no extinction with double simultaneous stimulation.   LOWER EXTREMITY ROM:    WNL  LOWER EXTREMITY MMT:    MMT Right Eval Left Eval  Hip flexion 5 5  Hip extension    Hip abduction 4 4-  Hip adduction 4 4  Hip internal rotation    Hip external rotation    Knee flexion 5 5  Knee extension 5 4  Ankle dorsiflexion 5 4  Ankle plantarflexion 3 3  Ankle inversion    Ankle eversion    (Blank rows = not tested)  STAIRS: Findings: Level of Assistance: Modified independence and Comments: unsteady w/ lateral LOB without HR GAIT: Findings: Distance walked: no issues and Comments: LLE maintained in external rotation and circumduction for stepping over obstacles---reports long standing habit from hx of LLE sciatica  FUNCTIONAL TESTS:  5 times sit to stand: 14 sec Dynamic Gait Index: Dynamic Gait Index  Mark the lowest level that applies.   Date Performed 06/10/24  Gait level surface (3) Normal: walks 20', no AD, good speed, no evidence for imbalance, normal gait pattern  2. Change in gait speed (2) Mild Impairment: Is able to change speed but demonstrates mild gait deviations, or not gait deviations but unable to achieve a significant change in velocity, or uses an assistive device  3. Gait with horizontal head turns (1) Moderate Impairment: Performs head turns with moderate change in gait velocity, slows down, staggers but recovers, can continue to walk  4. Gait with vertical head turns (2) Mild Impairment: Performs head turns smoothly with slight change in gait velocity, i.e., minor disruption to smooth gait path or uses  walking aid  5. Gait and pivot turn (3) Normal: Pivot turns safely within 3 seconds and stops quickly with no loss of balance  6. Step over obstacle (3) Normal: Is able to step over the box without changing gait speed, no evidence of imbalance  7. Step around obstacle (3) Normal: Is able to walk around cones safely without changing gait speed; no evidence of imbalance  8. Steps (2) Mild Impairment: Alternating feet, must use rail  Total score 19/24    Score Interpretation: Score of <19 indicates high risk of falls.  Minimally Clinically Important Difference (MCID):  =DGI scores of<21/24 = 1.80 points DGI scores of >21/24 = 0.60 points   [place] T, Inbar-Borovsky N, Brozgol M, Giladi N, [place] JM. The Dynamic Gait Index in healthy older adults: the role of stair climbing, fear of falling and gender. Gait Posture. 2009 Feb;29(2):237-41. doi: 10.1016/j.gaitpost.2008.08.013. Epub 2008 Oct 8. PMID: 81154560; PMCID: EFR7290501.  Pardasaney, MYRTIS LOIS Bonus, GEANNIE POUR., et al. (2012). Sensitivity to change and responsiveness of four balance measures for community-dwelling older adults. Physical therapy 92(3): 388-397.    M-CTSIB  Condition 1: Firm Surface, EO 30 Sec, Normal Sway  Condition 2: Firm Surface, EC 30 Sec, Mild and Moderate Sway  Condition 3: Foam Surface, EO 15 Sec, Mild and Moderate Sway  Condition 4: Foam Surface, EC 15  Sec, Moderate and Severe Sway                                                                                                                                        GOALS: Goals reviewed with patient? Yes  SHORT TERM GOALS: Target date: same as LTG  LONG TERM GOALS: Target date: 07/15/24  Patient will be independent in HEP to improve functional outcomes Baseline:  Goal status: INITIAL  2.  Demo improved safety with ADL per mild sway x 30 sec condition 4 M-CTSIB Baseline: mod-severe x 15 sec Goal status: INITIAL  3.  Improve safety with ambulation  and reduce fall risk per score 22/24 Dynamic Gait Index Baseline: 19/24 Goal status: INITIAL  4.  Improve LLE strength to 5/5 for single limb support to improve negotiation of obstacles Baseline: see above Goal status: INITIAL    ASSESSMENT:  CLINICAL IMPRESSION: Skilled PT session today focused on review of HEP and static/dynamic balance.  With step and weightshift and stepping activities over obstacles, pt requires cues for increased step length.  With addition of ankle weights for stepping over obstacles and weighted ball lift overhead for sit to stand, pt notes fatigue by end of session.  He will continue to benefit from skilled PT to further improve functional mobility and balance.  OBJECTIVE IMPAIRMENTS: Abnormal gait, decreased activity tolerance, decreased balance, decreased endurance, difficulty walking, decreased strength, and dizziness.    PLAN:  PT FREQUENCY: 1x/week  PT DURATION: 4 weeks  PLANNED INTERVENTIONS: 97750- Physical Performance Testing, 97110-Therapeutic exercises, 97530- Therapeutic activity, [id number]- Neuromuscular re-education, 97535- Self Care, 02859- Manual therapy, [id number]- Gait training, [phone]- Orthotic Initial, [phone]- Canalith repositioning, [id number]- Aquatic Therapy, [phone]- Electrical stimulation (unattended), and 20560 (1-2 muscles), 20561 (3+ muscles)- Dry Needling  PLAN FOR NEXT SESSION: Check LTGs and discuss POC.   Greig Anon, PT 07/02/24 2:50 PM Phone: [phone] Fax: [phone]  [facility] at [facility] [address] [place], [place] [address] Phone # [phone] Fax # [phone]

## 2024-07-04 ENCOUNTER — Ambulatory Visit: Payer: PPO | Admitting: *Deleted

## 2024-07-04 VITALS — Ht 69.0 in | Wt 159.0 lb

## 2024-07-04 DIAGNOSIS — Z0000 Encounter for general adult medical examination without abnormal findings: Secondary | ICD-10-CM

## 2024-07-04 NOTE — Progress Notes (Signed)
 Subjective:   Walter Cross is a 73 y.o. male who presents for Medicare Annual/Subsequent preventive examination.  Visit Complete: Virtual I connected with  Walter Cross on 07/04/24 by a audio enabled telemedicine application and verified that I am speaking with the correct person using two identifiers.  Patient Location: Home  Provider Location: Home Office  I discussed the limitations of evaluation and management by telemedicine. The patient expressed understanding and agreed to proceed.  Vital Signs: Because this visit was a virtual/telehealth visit, some criteria may be missing or patient reported. Any vitals not documented were not able to be obtained and vitals that have been documented are patient reported.   Cardiac Risk Factors include: advanced age (>20men, >66 women);male gender     Objective:    Today's Vitals   07/04/24 1225  Weight: 159 lb (72.1 kg)  Height: 5' 9 (1.753 m)   Body mass index is 23.48 kg/m.     07/04/2024   12:18 PM 06/11/2024    7:44 AM 10/17/2023    6:20 PM 06/29/2023   12:25 PM 04/28/2022    3:53 PM 04/09/2021    3:52 PM 09/26/2020    1:00 AM  Advanced Directives  Does Patient Have a Medical Advance Directive? No No No No No No No  Does patient want to make changes to medical advance directive?      Yes (MAU/Ambulatory/Procedural Areas - Information given)   Would patient like information on creating a medical advance directive?  No - Patient declined No - Patient declined Yes (MAU/Ambulatory/Procedural Areas - Information given) No - Patient declined  No - Patient declined    Current Medications (verified) Outpatient Encounter Medications as of 07/04/2024  Medication Sig   acyclovir  ointment (ZOVIRAX ) 5 % Apply 1 application topically daily as needed (cold sores & herpes).   atorvastatin  (LIPITOR) 40 MG tablet TAKE 1 TABLET(40 MG) BY MOUTH DAILY   buPROPion  (WELLBUTRIN  XL) 150 MG 24 hr tablet TAKE 1 TABLET(150 MG) BY MOUTH DAILY    carvedilol  (COREG ) 12.5 MG tablet TAKE 1 TABLET(12.5 MG) BY MOUTH TWICE DAILY WITH A MEAL   Cholecalciferol (VITAMIN D) 50 MCG (2000 UT) CAPS Take 1 capsule by mouth daily in the afternoon.   clonazePAM  (KLONOPIN ) 0.5 MG tablet Take 1 tablet (0.5 mg total) by mouth 2 (two) times daily as needed for anxiety.   clopidogrel  (PLAVIX ) 75 MG tablet TAKE 1 TABLET(75 MG) BY MOUTH DAILY   diltiazem  (CARDIZEM  CD) 240 MG 24 hr capsule TAKE 1 CAPSULE(240 MG) BY MOUTH DAILY   folic acid  (FOLVITE ) 1 MG tablet TAKE 2 TABLETS(2 MG) BY MOUTH DAILY   irbesartan  (AVAPRO ) 300 MG tablet Take 1 tablet (300 mg total) by mouth daily. STOP ramipril    pantoprazole  (PROTONIX ) 40 MG tablet Take 1 tablet (40 mg total) by mouth daily.   sildenafil  (REVATIO ) 20 MG tablet 3-5 tablets daily as needed   tamsulosin  (FLOMAX ) 0.4 MG CAPS capsule TAKE 1 CAPSULE(0.4 MG) BY MOUTH DAILY   TRINTELLIX  10 MG TABS tablet TAKE 1 TABLET(10 MG) BY MOUTH DAILY   No facility-administered encounter medications on file as of 07/04/2024.    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 Echo 2007: mod LVH, diast dysfn, mild MR, no LVOT obstruction;  b. echo 9/13: upper septal thickening, no SAM of MV, no LVOT gradient, mild LVH, EF 65%   TIA (transient ischemic attack)    Vitamin D deficiency    Past Surgical History:  Procedure Laterality Date   CERVICAL SPINE SURGERY  1992-1993   nerve release, Dr Carles   SPINE SURGERY      L4-L5, [phone]   ULNAR NERVE TRANSPOSITION Right    Family History  Problem Relation Age of Onset   Cancer Mother 22       cervical   Cancer Father        lung   Stroke Brother    Atrial fibrillation Other    Social History   Socioeconomic History   Marital status: Widowed    Spouse name: Not on file   Number of children: Not on file   Years of education: Not on file   Highest education level: Some college, no degree  Occupational History   Occupation: Optician, dispensing: [employer]    Comment: retired  Tobacco Use   Smoking status: Former    Current packs/day: 0.00    Average packs/day: 1 pack/day for 7.0 years (7.0 ttl pk-yrs)    Types: Cigarettes    Start date: 11/28/1968    Quit date: 11/29/1975    Years since quitting: 48.6   Smokeless tobacco: Never  Vaping Use   Vaping status: Never Used  Substance and Sexual Activity   Alcohol use: Not Currently    Comment: 2-3 beers and wine/day   Drug use: No   Sexual activity: Not Currently    Birth control/protection: None  Other Topics Concern   Not on file  Social History Narrative   Not on file   Social Drivers of Health   Financial Resource Strain: Low Risk  (07/04/2024)   Overall Financial Resource Strain (CARDIA)    Difficulty of Paying Living Expenses: Not hard at all  Food Insecurity: No Food Insecurity (07/04/2024)   Hunger Vital Sign    Worried About Running Out of Food in the Last Year: Never true    Ran Out of Food in the Last Year: Never true  Transportation Needs: No Transportation Needs (07/04/2024)   PRAPARE - Administrator, Civil Service (Medical): No    Lack of Transportation (Non-Medical): No  Physical Activity: Inactive (07/04/2024)   Exercise Vital Sign    Days of Exercise per Week: 0 days    Minutes of Exercise per Session: 0 min  Stress: No Stress Concern Present (07/04/2024)   [employer] of Occupational Health - Occupational Stress Questionnaire     Feeling of Stress: Only a little  Recent Concern: Stress - Stress Concern Present (04/16/2024)   [employer] of Occupational Health - Occupational Stress Questionnaire    Feeling of Stress : To some extent  Social Connections: Socially Isolated (07/04/2024)   Social Connection and Isolation Panel    Frequency of Communication with Friends and Family: Three times a week    Frequency of Social Gatherings with Friends and Family: Three times a week  Attends Religious Services: Never    Active Member of Clubs or Organizations: No    Attends Banker Meetings: Never    Marital Status: Widowed    Tobacco Counseling Counseling given: Not Answered   Clinical Intake:  Pre-visit preparation completed: Yes  Pain : No/denies pain     Diabetes: No  How often do you need to have someone help you when you read instructions, pamphlets, or other written materials from your doctor or pharmacy?: 1 - Never  Interpreter Needed?: No  Information entered by :: Mliss Graff LPN   Activities of Daily Living    07/04/2024   12:24 PM 10/23/2023   11:52 AM  In your present state of health, do you have any difficulty performing the following activities:  Hearing? 0 0  Vision? 0 0  Difficulty concentrating or making decisions? 0 0  Walking or climbing stairs? 0 0  Dressing or bathing? 0 0  Doing errands, shopping? 0 0  Preparing Food and eating ? N N  Using the Toilet? N N  In the past six months, have you accidently leaked urine? N N  Do you have problems with loss of bowel control? N N  Managing your Medications? N N  Managing your Finances? N N  Housekeeping or managing your Housekeeping? N N    Patient Care Team: Duanne Butler DASEN, MD as PCP - General (Family Medicine) Nicholaus Sherlean CROME, [facility] (Inactive) as Pharmacist (Pharmacist) Cleotilde Sewer, OD (Optometry) Nahser, Aleene PARAS, MD (Inactive) as Consulting Physician (Cardiology)  Indicate any recent Medical  Services you may have received from other than [facility] providers in the past year (date may be approximate).     Assessment:   This is a routine wellness examination for Walter Cross.  Hearing/Vision screen Hearing Screening - Comments:: Does not wear hearing aids Vision Screening - Comments:: Cleotilde  Up to date   Goals Addressed             This Visit's Progress    Patient Stated       Get balance in better shape       Depression Screen    07/04/2024   12:27 PM 10/23/2023    3:43 PM 10/09/2023    2:03 PM 06/29/2023   12:24 PM 04/28/2022    3:49 PM 05/14/2021   11:22 AM 04/09/2021    3:54 PM  PHQ 2/9 Scores  PHQ - 2 Score 2 1 0 0 0 0 0  PHQ- 9 Score 8 2         Fall Risk    07/04/2024   12:20 PM 10/23/2023    3:43 PM 10/23/2023   11:52 AM 10/09/2023    2:03 PM 06/29/2023   12:25 PM  Fall Risk   Falls in the past year? 0 0 0 0 0  Number falls in past yr: 0 0 0 0 0  Injury with Fall? 0 0 0 0 0  Risk for fall due to :    No Fall Risks No Fall Risks  Follow up Falls evaluation completed;Education provided;Falls prevention discussed   Falls prevention discussed Falls prevention discussed;Education provided;Falls evaluation completed    MEDICARE RISK AT HOME: Medicare Risk at Home Any stairs in or around the home?: No If so, are there any without handrails?: No Home free of loose throw rugs in walkways, pet beds, electrical cords, etc?: Yes Adequate lighting in your home to reduce risk of falls?: Yes Life alert?: No Use of a  cane, walker or w/c?: No Grab bars in the bathroom?: Yes Shower chair or bench in shower?: No Elevated toilet seat or a handicapped toilet?: Yes  TIMED UP AND GO:  Was the test performed?  No    Cognitive Function:        07/04/2024   12:23 PM 06/29/2023   12:26 PM 04/28/2022    3:57 PM  6CIT Screen  What Year? 0 points 0 points 0 points  What month? 0 points 0 points 0 points  What time? 0 points 0 points 0 points  Count back from 20 0 points 0  points 0 points  Months in reverse 0 points 0 points 0 points  Repeat phrase 0 points 0 points 0 points  Total Score 0 points 0 points 0 points    Immunizations Immunization History  Administered Date(s) Administered   Fluad Quad(high Dose 65+) 11/05/2019, 09/16/2020   Fluad Trivalent(High Dose 65+) 10/23/2023   Influenza, High Dose Seasonal PF 09/05/2018   Influenza,inj,Quad PF,6+ Mos 09/20/2013, 11/19/2014, 09/17/2015, 09/13/2016, 11/03/2017   Influenza-Unspecified 10/08/2021   Pneumococcal Conjugate-13 03/18/2016   Pneumococcal Polysaccharide-23 03/17/2015   Tdap 02/16/2011   Zoster Recombinant(Shingrix) 10/16/2020, 01/20/2021    TDAP status: Due, Education has been provided regarding the importance of this vaccine. Advised may receive this vaccine at local pharmacy or Health Dept. Aware to provide a copy of the vaccination record if obtained from local pharmacy or Health Dept. Verbalized acceptance and understanding.  Flu Vaccine status: Due, Education has been provided regarding the importance of this vaccine. Advised may receive this vaccine at local pharmacy or Health Dept. Aware to provide a copy of the vaccination record if obtained from local pharmacy or Health Dept. Verbalized acceptance and understanding.  Pneumococcal vaccine status: Up to date  Covid-19 vaccine status: Information provided on how to obtain vaccines.   Qualifies for Shingles Vaccine? No   Zostavax completed Yes   Shingrix Completed?: Yes  Screening Tests Health Maintenance  Topic Date Due   INFLUENZA VACCINE  06/28/2024   Medicare Annual Wellness (AWV)  07/04/2025   Colonoscopy  11/06/2033   Pneumococcal Vaccine: 50+ Years  Completed   Hepatitis C Screening  Completed   Zoster Vaccines- Shingrix  Completed   Hepatitis B Vaccines  Aged Out   HPV VACCINES  Aged Out   Meningococcal B Vaccine  Aged Out   DTaP/Tdap/Td  Discontinued   COVID-19 Vaccine  Discontinued    Health  Maintenance  Health Maintenance Due  Topic Date Due   INFLUENZA VACCINE  06/28/2024    Colorectal cancer screening: No longer required.   Lung Cancer Screening: (Low Dose CT Chest recommended if Age 29-80 years, 20 pack-year currently smoking OR have quit w/in 15years.) does not qualify.   Lung Cancer Screening Referral:   Additional Screening:  Hepatitis C Screening: does not qualify; Completed 2017  Vision Screening: Recommended annual ophthalmology exams for early detection of glaucoma and other disorders of the eye. Is the patient up to date with their annual eye exam?  Yes  Who is the provider or what is the name of the office in which the patient attends annual eye exams? [facility] If pt is not established with a provider, would they like to be referred to a provider to establish care? No .   Dental Screening: Recommended annual dental exams for proper oral hygiene  Diabetic Foot Exam:   Community Resource Referral / Chronic Care Management: CRR required this visit?  No   CCM  required this visit?  No     Plan:     I have personally reviewed and noted the following in the patient's chart:   Medical and social history Use of alcohol, tobacco or illicit drugs  Current medications and supplements including opioid prescriptions. Patient is not currently taking opioid prescriptions. Functional ability and status Nutritional status Physical activity Advanced directives List of other physicians Hospitalizations, surgeries, and ER visits in previous 12 months Vitals Screenings to include cognitive, depression, and falls Referrals and appointments  In addition, I have reviewed and discussed with patient certain preventive protocols, quality metrics, and best practice recommendations. A written personalized care plan for preventive services as well as general preventive health recommendations were provided to patient.     Mliss Graff, LPN   11/29/7972   After Visit  Summary: (MyChart) Due to this being a telephonic visit, the after visit summary with patients personalized plan was offered to patient via MyChart   Nurse Notes:

## 2024-07-04 NOTE — Patient Instructions (Signed)
 Walter Cross , Thank you for taking time to come for your Medicare Wellness Visit. I appreciate your ongoing commitment to your health goals. Please review the following plan we discussed and let me know if I can assist you in the future.   Screening recommendations/referrals: Colonoscopy:  Recommended yearly ophthalmology/optometry visit for glaucoma screening and checkup Recommended yearly dental visit for hygiene and checkup  Vaccinations: Influenza vaccine:  Pneumococcal vaccine:  Tdap vaccine:  Shingles vaccine:       Preventive Care 65 Years and Older, Male Preventive care refers to lifestyle choices and visits with your health care provider that can promote health and wellness. What does preventive care include? A yearly physical exam. This is also called an annual well check. Dental exams once or twice a year. Routine eye exams. Ask your health care provider how often you should have your eyes checked. Personal lifestyle choices, including: Daily care of your teeth and gums. Regular physical activity. Eating a healthy diet. Avoiding tobacco and drug use. Limiting alcohol use. Practicing safe sex. Taking low doses of aspirin  every day. Taking vitamin and mineral supplements as recommended by your health care provider. What happens during an annual well check? The services and screenings done by your health care provider during your annual well check will depend on your age, overall health, lifestyle risk factors, and family history of disease. Counseling  Your health care provider may ask you questions about your: Alcohol use. Tobacco use. Drug use. Emotional well-being. Home and relationship well-being. Sexual activity. Eating habits. History of falls. Memory and ability to understand (cognition). Work and work Astronomer. Screening  You may have the following tests or measurements: Height, weight, and BMI. Blood pressure. Lipid and cholesterol levels. These  may be checked every 5 years, or more frequently if you are over 24 years old. Skin check. Lung cancer screening. You may have this screening every year starting at age 25 if you have a 30-pack-year history of smoking and currently smoke or have quit within the past 15 years. Fecal occult blood test (FOBT) of the stool. You may have this test every year starting at age 68. Flexible sigmoidoscopy or colonoscopy. You may have a sigmoidoscopy every 5 years or a colonoscopy every 10 years starting at age 55. Prostate cancer screening. Recommendations will vary depending on your family history and other risks. Hepatitis C blood test. Hepatitis B blood test. Sexually transmitted disease (STD) testing. Diabetes screening. This is done by checking your blood sugar (glucose) after you have not eaten for a while (fasting). You may have this done every 1-3 years. Abdominal aortic aneurysm (AAA) screening. You may need this if you are a current or former smoker. Osteoporosis. You may be screened starting at age 63 if you are at high risk. Talk with your health care provider about your test results, treatment options, and if necessary, the need for more tests. Vaccines  Your health care provider may recommend certain vaccines, such as: Influenza vaccine. This is recommended every year. Tetanus, diphtheria, and acellular pertussis (Tdap, Td) vaccine. You may need a Td booster every 10 years. Zoster vaccine. You may need this after age 19. Pneumococcal 13-valent conjugate (PCV13) vaccine. One dose is recommended after age 29. Pneumococcal polysaccharide (PPSV23) vaccine. One dose is recommended after age 65. Talk to your health care provider about which screenings and vaccines you need and how often you need them. This information is not intended to replace advice given to you by your health care  provider. Make sure you discuss any questions you have with your health care provider. Document Released:  12/11/2015 Document Revised: 08/03/2016 Document Reviewed: 09/15/2015 Elsevier Interactive Patient Education  2017 [employer].  Fall Prevention in the Home Falls can cause injuries. They can happen to people of all ages. There are many things you can do to make your home safe and to help prevent falls. What can I do on the outside of my home? Regularly fix the edges of walkways and driveways and fix any cracks. Remove anything that might make you trip as you walk through a door, such as a raised step or threshold. Trim any bushes or trees on the path to your home. Use bright outdoor lighting. Clear any walking paths of anything that might make someone trip, such as rocks or tools. Regularly check to see if handrails are loose or broken. Make sure that both sides of any steps have handrails. Any raised decks and porches should have guardrails on the edges. Have any leaves, snow, or ice cleared regularly. Use sand or salt on walking paths during winter. Clean up any spills in your garage right away. This includes oil or grease spills. What can I do in the bathroom? Use night lights. Install grab bars by the toilet and in the tub and shower. Do not use towel bars as grab bars. Use non-skid mats or decals in the tub or shower. If you need to sit down in the shower, use a plastic, non-slip stool. Keep the floor dry. Clean up any water that spills on the floor as soon as it happens. Remove soap buildup in the tub or shower regularly. Attach bath mats securely with double-sided non-slip rug tape. Do not have throw rugs and other things on the floor that can make you trip. What can I do in the bedroom? Use night lights. Make sure that you have a light by your bed that is easy to reach. Do not use any sheets or blankets that are too big for your bed. They should not hang down onto the floor. Have a firm chair that has side arms. You can use this for support while you get dressed. Do not have  throw rugs and other things on the floor that can make you trip. What can I do in the kitchen? Clean up any spills right away. Avoid walking on wet floors. Keep items that you use a lot in easy-to-reach places. If you need to reach something above you, use a strong step stool that has a grab bar. Keep electrical cords out of the way. Do not use floor polish or wax that makes floors slippery. If you must use wax, use non-skid floor wax. Do not have throw rugs and other things on the floor that can make you trip. What can I do with my stairs? Do not leave any items on the stairs. Make sure that there are handrails on both sides of the stairs and use them. Fix handrails that are broken or loose. Make sure that handrails are as long as the stairways. Check any carpeting to make sure that it is firmly attached to the stairs. Fix any carpet that is loose or worn. Avoid having throw rugs at the top or bottom of the stairs. If you do have throw rugs, attach them to the floor with carpet tape. Make sure that you have a light switch at the top of the stairs and the bottom of the stairs. If you do not have  them, ask someone to add them for you. What else can I do to help prevent falls? Wear shoes that: Do not have high heels. Have rubber bottoms. Are comfortable and fit you well. Are closed at the toe. Do not wear sandals. If you use a stepladder: Make sure that it is fully opened. Do not climb a closed stepladder. Make sure that both sides of the stepladder are locked into place. Ask someone to hold it for you, if possible. Clearly mark and make sure that you can see: Any grab bars or handrails. First and last steps. Where the edge of each step is. Use tools that help you move around (mobility aids) if they are needed. These include: Canes. Walkers. Scooters. Crutches. Turn on the lights when you go into a dark area. Replace any light bulbs as soon as they burn out. Set up your furniture so  you have a clear path. Avoid moving your furniture around. If any of your floors are uneven, fix them. If there are any pets around you, be aware of where they are. Review your medicines with your doctor. Some medicines can make you feel dizzy. This can increase your chance of falling. Ask your doctor what other things that you can do to help prevent falls. This information is not intended to replace advice given to you by your health care provider. Make sure you discuss any questions you have with your health care provider. Document Released: 09/10/2009 Document Revised: 04/21/2016 Document Reviewed: 12/19/2014 Elsevier Interactive Patient Education  2017 [employer].

## 2024-07-08 ENCOUNTER — Encounter: Payer: Self-pay | Admitting: Physical Therapy

## 2024-07-08 ENCOUNTER — Ambulatory Visit: Admitting: Physical Therapy

## 2024-07-08 DIAGNOSIS — R2681 Unsteadiness on feet: Secondary | ICD-10-CM

## 2024-07-08 DIAGNOSIS — M6281 Muscle weakness (generalized): Secondary | ICD-10-CM

## 2024-07-08 NOTE — Therapy (Signed)
 OUTPATIENT PHYSICAL THERAPY TREATMENT/DISCHARGE   Patient Name: Walter Cross MRN: [id number] DOB:[birth year], 73 y.o., male Today's Date: 07/08/2024   PCP: Duanne Butler DASEN, MD REFERRING PROVIDER: Evonnie Asberry RAMAN, DO   PHYSICAL THERAPY DISCHARGE SUMMARY  Visits from Start of Care: 5  Current functional level related to goals / functional outcomes: See LTGs below-pt has met 2 of 4 LTGs.   Remaining deficits: High level balance, decreased ankle strength (pt reports some of this may be from his sciatica issues)   Education / Equipment: HEP, fall prevention   Patient agrees to discharge. Patient goals were partially met. Patient is being discharged due to being pleased with the current functional level.  END OF SESSION:  PT End of Session - 07/08/24 1400     Visit Number 5    Number of Visits 5    Date for PT Re-Evaluation 07/15/24    Authorization Type [employer]    PT Start Time 1400    PT Stop Time 1438    PT Time Calculation (min) 38 min    Equipment Utilized During Treatment Gait belt    Activity Tolerance Patient tolerated treatment well    Behavior During Therapy WFL for tasks assessed/performed             Past Medical History:  Diagnosis Date   Adenomatous colon polyp 2015    Buccini (q 5 yrs)    Barrett's esophagus    Benign colon polyp    Depression    Dermatitis    axillary   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 Echo 2007: mod LVH, diast dysfn, mild MR, no LVOT obstruction;  b. 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  Not dragging my feet as much.  Feel like strength in legs is better.    From eval: Having balance issues and notes woozy/dizziness w/ change of positions such as supine to sit and sit to stand.  Reports in recent past he was on BP medication that lowered BP and caused syncope.  Notes when standing in showering and eyes closed he needs to support himself.  Notes generally poor balance Pt accompanied by: self  PERTINENT HISTORY: hx of lumbar surgery w/ LLE sciatica  PAIN:  Are you having pain? No  PRECAUTIONS: None  WEIGHT BEARING RESTRICTIONS: No  FALLS: Has patient fallen in last 6 months? Yes. Number of falls 2   LIVING ENVIRONMENT: Lives with: lives with an adult companion Lives in: House/apartment Stairs: 3 steps to enter Has following equipment at home: None  PATIENT GOALS: Improve balance  OBJECTIVE:    TODAY'S TREATMENT: 07/08/2024 Activity Comments  HEP Review Verbal review  of HEP and previous demo review last visit  DGI:  21/24 Improved from 19/24  Sit to stand, 3 x 5 reps Cues for increased forward lean, last several reps  Gait x 3 minutes 350 ft  MMT BLEs See below  Alternating BLE plantarflexion 10 reps Bilateral heel raises, 10 reps Cues for less UE support    M-CTSIB  Condition 1: Firm Surface, EO 30 Sec, Normal Sway  Condition 2: Firm Surface, EC 30 Sec, Mild Sway  Condition 3: Foam Surface, EO 30 Sec, Normal Sway  Condition 4: Foam Surface, EC 30 Sec, Mild Sway       PATIENT EDUCATION: Education details: Updates to HEP; progress towards goals, POC, plans for discharge this visit; discussed importance of consistent HEP performance; fall prevention education Person educated: Patient Education method: Explanation Education comprehension: verbalized understanding  HOME EXERCISE PROGRAM: Access Code: [email or web address] URL: [email or web address] Date: 07/08/2024 Prepared by: [facility] - Outpatient  Rehab - [facility]  Program Notes Walk along the hallway in your home, 2-3 times/day, 2-3 minutes at a time  Exercises - Corner Balance Feet Together With Eyes Closed  - 1 x daily - 7 x weekly - 3 sets - 30 sec hold - Corner Balance Feet Together: Eyes Open With Head Turns  - 1 x daily - 7 x weekly - 3 sets - 30 hold - Corner Balance Feet Together: Eyes Closed With Head Turns  - 1 x daily - 7 x weekly - 3 sets - 30 sec hold - Heel Toe Raises with Unilateral Counter Support  - 1 x daily - 7 x weekly - 3 sets - 10 reps - Side Stepping with Resistance at Ankles and Counter Support  - 1 x daily - 7 x weekly - 2 sets - 10 reps - Backward Monster Walk with Resistance at Ankles and Counter Support  - 1 x daily - 7 x weekly - 2 sets - 10 reps - Romberg Stance on Foam Pad  - 1 x daily - 7 x weekly - 3 sets - 30 sec hold - Tandem Walking with Counter Support  - 1 x daily - 7 x weekly - 1-2 sets - 2 min hold - Backward Walking with Counter Support   - 1 x daily - 7 x weekly - 1-2 sets - 2 min rounds hold - Marching with Resistance  - 1 x daily - 7 x weekly - 3 sets - 10 reps - Sit to Stand  - 1 x daily -  7 x weekly - 3 sets - 10 reps Note: Objective measures were completed at Evaluation unless otherwise noted.  DIAGNOSTIC FINDINGS:  per Dr. Collie notes: Medical records indicate that there have been concerns regarding his blood pressure medication causing orthostasis. There have also been concerns about alcohol contributing to symptoms. More recently, the patient went to his primary care physician and concerns about parkinsonism arose due to falls, shuffling gait, balance change and memory issues. MRI brain was done for the same symptoms in April, 2024. This demonstrated significant small vessel disease, even in the ponsHearing is intact to conversational tone. Sensation: Sensation is intact to light touch throughout (facial, trunk, extremities). Vibration is marked decreased distally. Pinprick is decreased distally.  There is no extinction with double simultaneous stimulation.   LOWER EXTREMITY ROM:    WNL  LOWER EXTREMITY MMT:    MMT Right Eval Left Eval RIGHT 07/08/2024 LEFT 07/08/2024  Hip flexion 5 5 5 5   Hip extension      Hip abduction 4 4- 5 5  Hip adduction 4 4 5 5   Hip internal rotation      Hip external rotation      Knee flexion 5 5 5 5   Knee extension 5 4 5 5   Ankle dorsiflexion 5 4 5 4   Ankle plantarflexion 3 3 3 3   Ankle inversion      Ankle eversion      (Blank rows = not tested)  STAIRS: Findings: Level of Assistance: Modified independence and Comments: unsteady w/ lateral LOB without HR GAIT: Findings: Distance walked: no issues and Comments: LLE maintained in external rotation and circumduction for stepping over obstacles---reports long standing habit from hx of LLE sciatica  FUNCTIONAL TESTS:  5 times sit to stand: 14 sec Dynamic Gait Index: Dynamic Gait Index  Mark the lowest level that applies.   Date  Performed 06/10/24  Gait level surface (3) Normal: walks 20', no AD, good speed, no evidence for imbalance, normal gait pattern  2. Change in gait speed (2) Mild Impairment: Is able to change speed but demonstrates mild gait deviations, or not gait deviations but unable to achieve a significant change in velocity, or uses an assistive device  3. Gait with horizontal head turns (1) Moderate Impairment: Performs head turns with moderate change in gait velocity, slows down, staggers but recovers, can continue to walk  4. Gait with vertical head turns (2) Mild Impairment: Performs head turns smoothly with slight change in gait velocity, i.e., minor disruption to smooth gait path or uses walking aid  5. Gait and pivot turn (3) Normal: Pivot turns safely within 3 seconds and stops quickly with no loss of balance  6. Step over obstacle (3) Normal: Is able to step over the box without changing gait speed, no evidence of imbalance  7. Step around obstacle (3) Normal: Is able to walk around cones safely without changing gait speed; no evidence of imbalance  8. Steps (2) Mild Impairment: Alternating feet, must use rail  Total score 19/24    Score Interpretation: Score of <19 indicates high risk of falls.  Minimally Clinically Important Difference (MCID):  =DGI scores of<21/24 = 1.80 points DGI scores of >21/24 = 0.60 points   Glenn Dale T, Inbar-Borovsky N, Brozgol M, Giladi N, [place] JM. The Dynamic Gait Index in healthy older adults: the role of stair climbing, fear of falling and gender. Gait Posture. 2009 Feb;29(2):237-41. doi: 10.1016/j.gaitpost.2008.08.013. Epub 2008 Oct 8. PMID: 81154560; PMCID: EFR7290501.  Tisha MYRTIS LOIS Verta,  N. K., et al. (2012). Sensitivity to change and responsiveness of four balance measures for community-dwelling older adults. Physical therapy 92(3): 388-397.    M-CTSIB  Condition 1: Firm Surface, EO 30 Sec, Normal Sway  Condition 2: Firm Surface, EC 30 Sec, Mild  and Moderate Sway  Condition 3: Foam Surface, EO 15 Sec, Mild and Moderate Sway  Condition 4: Foam Surface, EC 15 Sec, Moderate and Severe Sway                                                                                                                                        GOALS: Goals reviewed with patient? Yes  SHORT TERM GOALS: Target date: same as LTG  LONG TERM GOALS: Target date: 07/15/24  Patient will be independent in HEP to improve functional outcomes Baseline:  Goal status: MET, 07/08/2024  2.  Demo improved safety with ADL per mild sway x 30 sec condition 4 M-CTSIB Baseline: mod-severe x 15 sec>30 sec 07/08/2024 Goal status: MET, 07/08/2024  3.  Improve safety with ambulation and reduce fall risk per score 22/24 Dynamic Gait Index Baseline: 19/24, 21/24 07/08/2024 Goal status: PARTIALLY MET, 07/08/2024  4.  Improve LLE strength to 5/5 for single limb support to improve negotiation of obstacles Baseline: see above Goal status: PARTIALLY MET, 07/08/2024    ASSESSMENT:  CLINICAL IMPRESSION: Pt presents today and assessed LTGs.  Pt has met LTG 1 and 2.  He has improved DGI score to 21/24 from 19/24, so improved but not to goal level for LTG 3.  He has improved some with his strength, but ankle strength deficits remain (pt thinks this may be due to hx of sciatica issues).  Overall, he presents at decreased fall risk compared to eval.  He feels he has what he needs to continue HEP and exercises at home; he is agreeable to discharge at this time.    OBJECTIVE IMPAIRMENTS: Abnormal gait, decreased activity tolerance, decreased balance, decreased endurance, difficulty walking, decreased strength, and dizziness.    PLAN:  PT FREQUENCY: 1x/week  PT DURATION: 4 weeks  PLANNED INTERVENTIONS: 97750- Physical Performance Testing, 97110-Therapeutic exercises, 97530- Therapeutic activity, [id number]- Neuromuscular re-education, 97535- Self Care, 02859- Manual therapy, [id number]-  Gait training, [phone]- Orthotic Initial, [phone]- Canalith repositioning, [id number]- Aquatic Therapy, [phone]- Electrical stimulation (unattended), and 20560 (1-2 muscles), 20561 (3+ muscles)- Dry Needling  PLAN FOR NEXT SESSION: Discharge this visit.  Greig Anon, PT 07/08/24 2:42 PM Phone: [phone] Fax: [phone]  [facility] at [facility] [address] [place], [address] [place], [place] [address] Phone # [phone] Fax # [phone]

## 2024-07-13 ENCOUNTER — Other Ambulatory Visit: Payer: Self-pay | Admitting: Family Medicine

## 2024-07-16 NOTE — Telephone Encounter (Signed)
 Requested medication (s) are due for refill today - yes  Requested medication (s) are on the active medication list -yes  Future visit scheduled -no  Last refill: 03/06/24 #90  Notes to clinic: fails lab protocol- sent for PCP review of request   Requested Prescriptions  Pending Prescriptions Disp Refills   clopidogrel  (PLAVIX ) 75 MG tablet [Pharmacy Med Name: CLOPIDOGREL  75MG  TABLETS] 90 tablet 0    Sig: TAKE 1 TABLET(75 MG) BY MOUTH DAILY     Hematology: Antiplatelets - clopidogrel  Failed - 07/16/2024 12:59 PM      Failed - HCT in normal range and within 180 days    HCT  Date Value Ref Range Status  10/17/2023 39.2 39.0 - 52.0 % Final         Failed - HGB in normal range and within 180 days    Hemoglobin  Date Value Ref Range Status  10/17/2023 13.8 13.0 - 17.0 g/dL Final         Failed - PLT in normal range and within 180 days    Platelets  Date Value Ref Range Status  10/17/2023 234 150 - 400 K/uL Final         Passed - Cr in normal range and within 360 days    Creat  Date Value Ref Range Status  10/23/2023 0.91 0.70 - 1.28 mg/dL Final         Passed - Valid encounter within last 6 months    Recent Outpatient Visits           2 months ago Ataxia   [place] [facility] Duanne Butler DASEN, MD   8 months ago Hypokalemia   [place] [facility] Kayla Jeoffrey RAMAN, FNP   9 months ago History of TIA (transient ischemic attack)   [place] [facility] Duanne Butler DASEN, MD   1 year ago Nausea   [place] [facility] Duanne Butler DASEN, MD   1 year ago Fatigue, unspecified type   [place] [facility] Pickard, Butler DASEN, MD       Future Appointments             In 3 weeks Maccia, Melissa D, RPH-CPP [facility] at [facility] A Dept of [employer] [employer], [facility]               Requested Prescriptions  Pending Prescriptions Disp Refills   clopidogrel  (PLAVIX ) 75 MG  tablet [Pharmacy Med Name: CLOPIDOGREL  75MG  TABLETS] 90 tablet 0    Sig: TAKE 1 TABLET(75 MG) BY MOUTH DAILY     Hematology: Antiplatelets - clopidogrel  Failed - 07/16/2024 12:59 PM      Failed - HCT in normal range and within 180 days    HCT  Date Value Ref Range Status  10/17/2023 39.2 39.0 - 52.0 % Final         Failed - HGB in normal range and within 180 days    Hemoglobin  Date Value Ref Range Status  10/17/2023 13.8 13.0 - 17.0 g/dL Final         Failed - PLT in normal range and within 180 days    Platelets  Date Value Ref Range Status  10/17/2023 234 150 - 400 K/uL Final         Passed - Cr in normal range and within 360 days    Creat  Date Value Ref Range Status  10/23/2023  0.91 0.70 - 1.28 mg/dL Final         Passed - Valid encounter within last 6 months    Recent Outpatient Visits           2 months ago Ataxia   [place] [facility] Duanne, Butler DASEN, MD   8 months ago Hypokalemia   [place] [facility] Kayla Jeoffrey RAMAN, FNP   9 months ago History of TIA (transient ischemic attack)   [place] [facility] Duanne Butler DASEN, MD   1 year ago Nausea   [place] [facility] Duanne Butler DASEN, MD   1 year ago Fatigue, unspecified type   [place] [facility] Pickard, Butler DASEN, MD       Future Appointments             In 3 weeks Maccia, Melissa D, RPH-CPP [facility] at [facility] A Dept of [employer]. [facility] [employer], [employer]

## 2024-07-23 NOTE — Telephone Encounter (Signed)
 Pharmacy sent follow up refill request for clopidogrel  (PLAVIX ) 75 MG tablet   Pharmacy:  [id number] [place], [place] - [address] AT [facility] OF [facility] [address] [facility] [address], [place] [address] Phone: [phone]  Fax: [phone] DEA #: [id number]   Please advise pharmacist.

## 2024-08-05 ENCOUNTER — Other Ambulatory Visit: Payer: Self-pay | Admitting: Family Medicine

## 2024-08-06 ENCOUNTER — Encounter: Payer: Self-pay | Admitting: Family Medicine

## 2024-08-12 ENCOUNTER — Ambulatory Visit: Attending: Cardiology | Admitting: Pharmacist

## 2024-08-12 VITALS — BP 166/90 | HR 70

## 2024-08-12 DIAGNOSIS — I10 Essential (primary) hypertension: Secondary | ICD-10-CM

## 2024-08-12 MED ORDER — CARVEDILOL 25 MG PO TABS: 25.0000 mg | ORAL_TABLET | Freq: Two times a day (BID) | ORAL | 3 refills | Status: AC

## 2024-08-12 NOTE — Progress Notes (Signed)
 Patient ID: Walter Cross                 DOB: Mar 09, 1951                      MRN: [id number]      HPI: Walter Cross is a 73 y.o. male referred by Artist Pouch, PA to HTN clinic. PMH is significant for HTN, HLD, TIA and LVH and HCM.   He was started on a new antihypertensive medication by Dr. Duanne, hydrochlorothiazide  due to significant hypertension noted in clinic. However, after just a few doses, patient experienced an episode of syncope prompting ED evaluation This episode lasted approximately 10-15 minutes and required paramedic intervention. ED evaluation noted hypokalemia. Hydrochlorothiazide  was discontinued and patient started on short course of K supplementation.  Last seen in HTN clinic on 07/01/24, during which his blood pressure was 152/82 mmHg. His ramipril  was stopped and he was started on irbesartan  300 mg daily.   Today Walter Cross presents for further titration of antihypertensive medications. Has been taking his irbesartan  without issues. Denies any new symptoms of dizziness or lightheadedness. Does endorse being more tired than normal. He usually takes a 30 minute nap in the afternoon and tries to go to sleep around 11:30 PM each night with variable success. If he has difficulty sleeping, he takes a clonazepam  tablet (~ 1 to 2 times each week). Discussed that drowsiness is not an expected side effect with irbesartan  and improvement in sleep hygiene may help.  Blood pressures at home have been higher since changing from ramipril  to irbesartan . He reports SBP 160s-170s and heart rates in the 70s. Blood pressure in the office today is 168/90 mmHg, heart rate 70 bpm. Discussed that he is at the maximum dose of irbesartan , but that we can increase his carvedilol  to improve blood pressure control. Explained that this will likely not get him to his goal blood pressure, but will be a step in the right direction. He is amenable to this change. He does not currently use a pill  box to organize his medications, recommended trialing a day/night pill box to help him remember which medications he's taken. Remembers to take his medications twice daily, once when he wakes up and once at dinner.   He has had ongoing conversations about reducing alcohol intake. He currently reports that he has cut back to ~ 2 beers/day. Celebrated this improvement with him and encouraged him to continue to work on reducing alcohol intake. He is currently walking a few times each week as exercise, and expresses interest in increasing his exercise to help better control his blood pressure.   Current HTN meds: carvedilol  12.5mg  twice a day, diltiazem  240mg  daily, irbesartan  300 mg Previously tried: hydrochlorothiazide  (hypokalemia, syncope) BP goal: <130/80 mmHg  Family History:  Family History  Problem Relation Age of Onset   Cancer Mother 37       cervical   Cancer Father        lung   Stroke Brother    Atrial fibrillation Other     Social History: 2 beers almost everyday, no tobacco  Exercise: Walking every few days  Home BP readings: SBP 160-170s, HR 70s   Wt Readings from Last 3 Encounters:  07/04/24 159 lb (72.1 kg)  05/20/24 165 lb (74.8 kg)  04/18/24 168 lb (76.2 kg)   BP Readings from Last 3 Encounters:  08/12/24 (!) 166/90  07/01/24 (!) 152/82  05/20/24 (!) 176/90  Pulse Readings from Last 3 Encounters:  08/12/24 70  07/01/24 70  05/20/24 97    Renal function: CrCl cannot be calculated (Patient's most recent lab result is older than the maximum 21 days allowed.).  Past Medical History:  Diagnosis Date   Adenomatous colon polyp 2015    Buccini (q 5 yrs)    Barrett's esophagus    Benign colon polyp    Depression    Dermatitis    axillary   Exposure to asbestos    auto brake liners   Genital HSV    GERD (gastroesophageal reflux disease)    Hx of cardiovascular stress test    ETT-Myoview 9/13: no ischemia, EF 81%   Hyperlipidemia    Hypertension     Hypertrophic cardiomyopathy (HCC)    a. Echo 2007: mod LVH, diast dysfn, mild MR, no LVOT obstruction;  b. echo 9/13: upper septal thickening, no SAM of MV, no LVOT gradient, mild LVH, EF 65%   TIA (transient ischemic attack)    Vitamin D deficiency     Current Outpatient Medications on File Prior to Visit  Medication Sig Dispense Refill   atorvastatin  (LIPITOR) 40 MG tablet TAKE 1 TABLET(40 MG) BY MOUTH DAILY 90 tablet 3   buPROPion  (WELLBUTRIN  XL) 150 MG 24 hr tablet TAKE 1 TABLET(150 MG) BY MOUTH DAILY 90 tablet 0   Cholecalciferol (VITAMIN D) 50 MCG (2000 UT) CAPS Take 1 capsule by mouth daily in the afternoon.     clonazePAM  (KLONOPIN ) 0.5 MG tablet Take 1 tablet (0.5 mg total) by mouth 2 (two) times daily as needed for anxiety. 30 tablet 1   clopidogrel  (PLAVIX ) 75 MG tablet TAKE 1 TABLET(75 MG) BY MOUTH DAILY 90 tablet 0   diltiazem  (CARDIZEM  CD) 240 MG 24 hr capsule TAKE 1 CAPSULE(240 MG) BY MOUTH DAILY 90 capsule 3   folic acid  (FOLVITE ) 1 MG tablet TAKE 2 TABLETS(2 MG) BY MOUTH DAILY 180 tablet 0   irbesartan  (AVAPRO ) 300 MG tablet Take 1 tablet (300 mg total) by mouth daily. STOP ramipril  90 tablet 3   pantoprazole  (PROTONIX ) 40 MG tablet Take 1 tablet (40 mg total) by mouth daily. 90 tablet 1   tamsulosin  (FLOMAX ) 0.4 MG CAPS capsule TAKE 1 CAPSULE(0.4 MG) BY MOUTH DAILY 90 capsule 3   TRINTELLIX  10 MG TABS tablet TAKE 1 TABLET(10 MG) BY MOUTH DAILY 90 tablet 3   acyclovir  ointment (ZOVIRAX ) 5 % Apply 1 application topically daily as needed (cold sores & herpes). 15 g 1   sildenafil  (REVATIO ) 20 MG tablet 3-5 tablets daily as needed (Patient not taking: Reported on 08/12/2024) 50 tablet 6   No current facility-administered medications on file prior to visit.    Allergies  Allergen Reactions   Sulfa Antibiotics     Pt reports causes aches all over   Esomeprazole Magnesium Other (See Comments)    cramps   Septra [Bactrim] Other (See Comments)    Back ache    Sulfamethoxazole-Trimethoprim     Other reaction(s): Other (See Comments) Back ache Back ache Back ache     Blood pressure (!) 166/90, pulse 70.   Assessment/Plan: HYPERTENSION CONTROL Vitals:   08/12/24 1445 08/12/24 1652  BP: (!) 168/90 (!) 166/90    The patient's blood pressure is elevated above target today.  In order to address the patient's elevated BP: A current anti-hypertensive medication was adjusted today.      1. Hypertension -  HTN (hypertension) Assessment: - Currently on carvedilol  12.5 mg twice  daily, diltiazem  240 mg daily, irbesartan  300 mg daily - Blood pressures at home are in the 160s-170s systolic, remains above goal of < 130/80 mmHg - Continues to drink ~ 2 beers/day, trying to cut back - Walking for exercise a few times each week  Plan: - Increase carvedilol  to 25 mg twice daily - Continue irbesartan  300 mg daily, diltiazem  240 mg daily - Continue to monitor blood pressures at home - BMP today - Follow up in clinic in ~ 4 weeks - Continue to work on reduction in alcohol intake and increased aerobic exercise   Thank you,  Nidia Schaffer, PharmD PGY2 Cardiology Pharmacy Resident  Eleanor JONETTA Crews, Pharm.JONETTA SARAN, CPP [facility] A Division of [facility] [address] [place], [place] [address]  Phone: [phone]; Fax: [phone]

## 2024-08-12 NOTE — Patient Instructions (Signed)
 Increase to carvedilol  25 mg twice daily. Continue to monitor daily blood pressure and heart rate. Continue to work on exercise and reducing alcohol intake.

## 2024-08-12 NOTE — Assessment & Plan Note (Addendum)
 Assessment: - Currently on carvedilol  12.5 mg twice daily, diltiazem  240 mg daily, irbesartan  300 mg daily - Blood pressures at home are in the 160s-170s systolic, remains above goal of < 130/80 mmHg - Continues to drink ~ 2 beers/day, trying to cut back - Walking for exercise a few times each week - There is some question of compliance and if patient is taking carvedilol  twice a day. Reiterated that carvedilol  should be taken twice a day  Plan: - Increase carvedilol  to 25 mg twice daily - Continue irbesartan  300 mg daily, diltiazem  240 mg daily - Continue to monitor blood pressures at home - BMP today - Follow up in clinic in ~ 4 weeks - Continue to work on reduction in alcohol intake and increased aerobic exercise

## 2024-08-13 ENCOUNTER — Ambulatory Visit: Payer: Self-pay | Admitting: Pharmacist

## 2024-08-13 LAB — BASIC METABOLIC PANEL WITH GFR
BUN/Creatinine Ratio: 8 — ABNORMAL LOW (ref 10–24)
BUN: 6 mg/dL — ABNORMAL LOW (ref 8–27)
CO2: 26 mmol/L (ref 20–29)
Calcium: 8.9 mg/dL (ref 8.6–10.2)
Chloride: 96 mmol/L (ref 96–106)
Creatinine, Ser: 0.78 mg/dL (ref 0.76–1.27)
Glucose: 103 mg/dL — ABNORMAL HIGH (ref 70–99)
Potassium: 4.1 mmol/L (ref 3.5–5.2)
Sodium: 137 mmol/L (ref 134–144)
eGFR: 94 mL/min/1.73 (ref >59)

## 2024-09-08 ENCOUNTER — Other Ambulatory Visit: Payer: Self-pay | Admitting: Family Medicine

## 2024-09-08 DIAGNOSIS — F321 Major depressive disorder, single episode, moderate: Secondary | ICD-10-CM

## 2024-09-11 ENCOUNTER — Ambulatory Visit: Attending: Cardiovascular Disease | Admitting: Pharmacist

## 2024-09-11 VITALS — BP 160/80 | HR 63

## 2024-09-11 DIAGNOSIS — I10 Essential (primary) hypertension: Secondary | ICD-10-CM | POA: Diagnosis not present

## 2024-09-11 MED ORDER — SPIRONOLACTONE 25 MG PO TABS: 12.5000 mg | ORAL_TABLET | Freq: Every day | ORAL | 3 refills | Status: AC

## 2024-09-11 NOTE — Progress Notes (Signed)
 Patient ID: Walter Cross                 DOB: 1951-04-15                      MRN: [id number]      HPI: Walter Cross is a 73 y.o. male referred by Walter Pouch, PA to HTN clinic. PMH is significant for HTN, HLD, TIA and LVH and HCM.   He was started on a new antihypertensive medication by Dr. Duanne, hydrochlorothiazide  due to significant hypertension noted in clinic. However, after just a few doses, patient experienced an episode of syncope prompting ED evaluation This episode lasted approximately 10-15 minutes and required paramedic intervention. ED evaluation noted hypokalemia. Hydrochlorothiazide  was discontinued and patient started on short course of K supplementation.  Seen in HTN clinic on 07/01/24, during which his blood pressure was 152/82 mmHg. His ramipril  was stopped and he was started on irbesartan  300 mg daily. Last seen 08/12/24. BP was 166/90 in the office, home BP 160-170's. Carvedilol  was increased to 25mg  twice a day.  He presents today for follow up.  He brings in blood pressure readings that range anywhere from 104/58 to 182/81.  Several in the mid 130s/60s.  Still not doing much exercise.  Lacks motivation and suffering from depression.  We discussed getting both a psychiatrist and a Veterinary surgeon.  Drinking about 2 beers per day, down from 3 to 4/day.  We discussed decreasing this even further.  Denies any dizziness lightheadedness.  Maybe 2 headaches a month that resolved with Tylenol .  Eating a lot of frozen TV dinners because he does not feel like cooking.  We talked about the sodium content in these and potentially looking for some of the healthy choice meals which have less sodium in them.  Current HTN meds: carvedilol  25mg  twice a day, diltiazem  240mg  daily, irbesartan  300 mg Previously tried: hydrochlorothiazide  (hypokalemia, syncope) BP goal: <130/80 mmHg  Family History:  Family History  Problem Relation Age of Onset   Cancer Mother 41       cervical    Cancer Father        lung   Stroke Brother    Atrial fibrillation Other     Social History: 2 beers almost everyday, no tobacco  Exercise: Walking every few days  Diet: lost about 10 lb Breakfast: none Lunch: sandwich w/ [employer] or boloney  Dinner: frozen dinners - [employer] Drink: water   Home BP readings:  123 67  176 85  160 86  173 84  124 68  137 66  182 81  104 58  110 57  148 68  136 70  132 71  135  62  134 60  166 79  142.6667 70.8     Wt Readings from Last 3 Encounters:  07/04/24 159 lb (72.1 kg)  05/20/24 165 lb (74.8 kg)  04/18/24 168 lb (76.2 kg)   BP Readings from Last 3 Encounters:  09/11/24 (!) 160/80  08/12/24 (!) 166/90  07/01/24 (!) 152/82   Pulse Readings from Last 3 Encounters:  09/11/24 63  08/12/24 70  07/01/24 70    Renal function: CrCl cannot be calculated (Patient's most recent lab result is older than the maximum 21 days allowed.).  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 Echo 2007: mod LVH, diast dysfn, mild MR, no LVOT obstruction;  b. echo 9/13: upper septal thickening, no SAM of MV, no LVOT gradient, mild LVH, EF 65%   TIA (transient ischemic attack)    Vitamin D deficiency     Current Outpatient Medications on File Prior to Visit  Medication Sig Dispense Refill   carvedilol  (COREG ) 25 MG tablet Take 1 tablet (25 mg total) by mouth 2 (two) times daily. 180 tablet 3   diltiazem  (CARDIZEM  CD) 240 MG 24 hr capsule TAKE 1 CAPSULE(240 MG) BY MOUTH DAILY 90 capsule 3   irbesartan  (AVAPRO ) 300 MG tablet Take 1 tablet (300 mg total)  by mouth daily. STOP ramipril  90 tablet 3   acyclovir  ointment (ZOVIRAX ) 5 % Apply 1 application topically daily as needed (cold sores & herpes). 15 g 1   atorvastatin  (LIPITOR) 40 MG tablet TAKE 1 TABLET(40 MG) BY MOUTH DAILY 90 tablet 3   buPROPion  (WELLBUTRIN  XL) 150 MG 24 hr tablet TAKE 1 TABLET(150 MG) BY MOUTH DAILY 90 tablet 0   Cholecalciferol (VITAMIN D) 50 MCG (2000 UT) CAPS Take 1 capsule by mouth daily in the afternoon.     clonazePAM  (KLONOPIN ) 0.5 MG tablet TAKE 1 TABLET(0.5 MG) BY MOUTH TWICE DAILY AS NEEDED FOR ANXIETY 30 tablet 0   clopidogrel  (PLAVIX ) 75 MG tablet TAKE 1 TABLET(75 MG) BY MOUTH DAILY 90 tablet 0   folic acid  (FOLVITE ) 1 MG tablet TAKE 2 TABLETS(2 MG) BY MOUTH DAILY 180 tablet 0   pantoprazole  (PROTONIX ) 40 MG tablet Take 1 tablet (40 mg total) by mouth daily. 90 tablet 1   sildenafil  (REVATIO ) 20 MG tablet 3-5 tablets daily as needed (Patient not taking: Reported on 08/12/2024) 50 tablet 6   tamsulosin  (FLOMAX ) 0.4 MG CAPS capsule TAKE 1 CAPSULE(0.4 MG) BY MOUTH DAILY 90 capsule 3   TRINTELLIX  10 MG TABS tablet TAKE 1 TABLET(10 MG) BY MOUTH DAILY 90 tablet 3   No current facility-administered medications on file prior to visit.    Allergies  Allergen Reactions   Sulfa Antibiotics     Pt reports causes aches all over   Esomeprazole Magnesium Other (See Comments)    cramps   Septra [Bactrim] Other (See Comments)    Back ache   Sulfamethoxazole-Trimethoprim     Other reaction(s): Other (See Comments) Back ache Back ache Back ache     Blood pressure (!) 160/80, pulse 63.   Assessment/Plan: HYPERTENSION CONTROL Vitals:   09/11/24 1457 09/11/24 1458  BP: (!) 150/82 (!) 160/80    The patient's blood pressure is elevated above target today.  In order to address the patient's elevated BP:       1. Hypertension -  HTN (hypertension) Assessment: Blood pressure in clinic above goal Home blood pressures averaging about 142/70 but are quite  variable Patient still drinking 2 beers per night and not doing much exercise We discussed the importance of cutting back on alcohol and increasing his exercise along with cutting back on sodium intake Had hypokalemia with hydrochlorothiazide  Tolerating increased dose of carvedilol  fine Reports missing his evening dose of carvedilol  about 3 times a month  Plan: Start spironolactone 12.5 mg daily Continue diltiazem  240 mg daily, carvedilol  25 mg twice a day and irbesartan  300 mg daily Continue monitoring blood pressure at home Reinforced lifestyle modifications including increased exercise decrease alcohol and sodium Follow-up in 3 weeks    Thank you,  Shalan Neault D Tish Begin, Pharm.JONETTA SARAN, CPP [facility] Division of [facility] [address] [place], [place] [address]  Phone: [phone]; Fax: [phone]

## 2024-09-11 NOTE — Patient Instructions (Addendum)
 Your blood pressure goal is < 130/18mmHg   Start taking spironolactone 12.5mg  (1/2 tablet) by mouth daily Continue  carvedilol  25mg  twice a day, diltiazem  240mg  daily, irbesartan  300 mg  Cut back even more on alcohol and start going for walks  Important lifestyle changes to control high blood pressure  Intervention  Effect on the BP   Weight loss Weight loss is one of the most effective lifestyle changes for controlling blood pressure. If you're overweight or obese, losing even a small amount of weight can help reduce blood pressure.    Blood pressure can decrease by 1 millimeter of mercury (mmHg) with each kilogram (about 2.2 pounds) of weight lost.   Exercise regularly As a general goal, aim for 30 minutes of moderate physical activity every day.    Regular physical activity can lower blood pressure by 5 - 8 mmHg.   Eat a healthy diet Eat a diet rich in whole grains, fruits, vegetables, lean meat, and low-fat dairy products. Limit processed foods, saturated fat, and sweets.    A heart-healthy diet can lower high blood pressure by 10 mmHg.   Reduce salt (sodium) in your diet Aim for 000mg  of sodium each day. Avoid deli meats, canned food, and frozen microwave meals which are high in sodium.     Limiting sodium can reduce blood pressure by 5 mmHg.   Limit alcohol One drink equals 12 ounces of beer, 5 ounces of wine, or 1.5 ounces of 80-proof liquor.    Limiting alcohol to < 1 drink a day for women or < 2 drinks a day for men can help lower blood pressure by about 4 mmHg.   To check your pressure at home you will need to:   Sit up in a chair, with feet flat on the floor and back supported. Do not cross your ankles or legs. Rest your left arm so that the cuff is about heart level. If the cuff goes on your upper arm, then just relax your arm on the table, arm of the chair, or your lap. If you have a wrist cuff, hold your wrist against your chest at heart level. Place the  cuff snugly around your arm, about 1 inch above the crease of your elbow. The cords should be inside the groove of your elbow.  Sit quietly, with the cuff in place, for about 5 minutes. Then press the power button to start a reading. Do not talk or move while the reading is taking place.  Record your readings on a sheet of paper. Although most cuffs have a memory, it is often easier to see a pattern developing when the numbers are all in front of you.  You can repeat the reading after 1-3 minutes if it is recommended.   Make sure your bladder is empty and you have not had caffeine or tobacco within the last 30 minutes   Always bring your blood pressure log with you to your appointments. If you have not brought your monitor in to be double checked for accuracy, please bring it to your next appointment.   You can find a list of validated (accurate) blood pressure cuffs at: validatebp.org

## 2024-09-11 NOTE — Assessment & Plan Note (Addendum)
 Assessment: Blood pressure in clinic above goal Home blood pressures averaging about 142/70 but are quite variable Patient still drinking 2 beers per night and not doing much exercise We discussed the importance of cutting back on alcohol and increasing his exercise along with cutting back on sodium intake Had hypokalemia with hydrochlorothiazide  Tolerating increased dose of carvedilol  fine Reports missing his evening dose of carvedilol  about 3 times a month  Plan: Start spironolactone 12.5 mg daily Continue diltiazem  240 mg daily, carvedilol  25 mg twice a day and irbesartan  300 mg daily Continue monitoring blood pressure at home Reinforced lifestyle modifications including increased exercise decrease alcohol and sodium Follow-up in 3 weeks

## 2024-09-12 ENCOUNTER — Other Ambulatory Visit: Payer: Self-pay

## 2024-09-12 MED ORDER — ATORVASTATIN CALCIUM 40 MG PO TABS: ORAL_TABLET | ORAL | 3 refills | Status: AC

## 2024-09-29 ENCOUNTER — Other Ambulatory Visit: Payer: Self-pay

## 2024-09-29 NOTE — Progress Notes (Signed)
 Contacted patient to discuss potential medication access barriers related to use of non-preferred pharmacy. Discussed health plan preferred pharmacies.  Patient is not interested in transitioning from [facility] at this time. Provided him with the list of preferred pharmacies and encouraged him to call back if there were any outstanding questions.   Woodie Jock, PharmD PGY1 Pharmacy Resident  09/29/2024

## 2024-10-04 ENCOUNTER — Ambulatory Visit: Admitting: Pharmacist

## 2024-11-06 ENCOUNTER — Other Ambulatory Visit: Payer: Self-pay | Admitting: Family Medicine

## 2024-11-06 DIAGNOSIS — L814 Other melanin hyperpigmentation: Secondary | ICD-10-CM | POA: Diagnosis not present

## 2024-11-06 DIAGNOSIS — D1801 Hemangioma of skin and subcutaneous tissue: Secondary | ICD-10-CM | POA: Diagnosis not present

## 2024-11-06 DIAGNOSIS — L821 Other seborrheic keratosis: Secondary | ICD-10-CM | POA: Diagnosis not present

## 2024-11-06 DIAGNOSIS — D485 Neoplasm of uncertain behavior of skin: Secondary | ICD-10-CM | POA: Diagnosis not present

## 2024-11-06 DIAGNOSIS — L570 Actinic keratosis: Secondary | ICD-10-CM | POA: Diagnosis not present

## 2024-11-12 ENCOUNTER — Other Ambulatory Visit: Payer: Self-pay | Admitting: Family Medicine

## 2024-11-15 ENCOUNTER — Other Ambulatory Visit: Payer: Self-pay | Admitting: Cardiology

## 2024-11-27 ENCOUNTER — Ambulatory Visit: Attending: Internal Medicine | Admitting: Pharmacist

## 2024-11-27 VITALS — BP 130/72 | HR 76

## 2024-11-27 DIAGNOSIS — I10 Essential (primary) hypertension: Secondary | ICD-10-CM | POA: Diagnosis not present

## 2024-11-27 LAB — BASIC METABOLIC PANEL WITH GFR
BUN/Creatinine Ratio: 9 — ABNORMAL LOW (ref 10–24)
BUN: 8 mg/dL (ref 8–27)
CO2: 26 mmol/L (ref 20–29)
Calcium: 8.6 mg/dL (ref 8.6–10.2)
Chloride: 99 mmol/L (ref 96–106)
Creatinine, Ser: 0.85 mg/dL (ref 0.76–1.27)
Glucose: 105 mg/dL — ABNORMAL HIGH (ref 70–99)
Potassium: 3.7 mmol/L (ref 3.5–5.2)
Sodium: 139 mmol/L (ref 134–144)
eGFR: 91 mL/min/1.73

## 2024-11-27 NOTE — Progress Notes (Signed)
 Patient ID: Walter Cross                 DOB: [birth year]                      MRN: [id number]      HPI: Walter Cross is a 73 y.o. male referred by Artist Pouch, PA to HTN clinic. PMH is significant for HTN, HLD, TIA and LVH and HCM.   He was started on a new antihypertensive medication by Dr. Duanne, hydrochlorothiazide  due to significant hypertension noted in clinic. However, after just a few doses, patient experienced an episode of syncope prompting ED evaluation This episode lasted approximately 10-15 minutes and required paramedic intervention. ED evaluation noted hypokalemia. Hydrochlorothiazide  was discontinued and patient started on short course of K supplementation.  Seen in HTN clinic on 07/01/24, during which his blood pressure was 152/82 mmHg. His ramipril  was stopped and he was started on irbesartan  300 mg daily. Last seen 08/12/24. BP was 166/90 in the office, home BP 160-170's. Carvedilol  was increased to 25mg  twice a day.  At last visit (09/11/24), his home blood pressure averaged about 142/70. Spironolactone  12.5mg  daily was started.   Patient presents today for follow-up.  His home blood pressures are much improved after starting 12.5 mg of spironolactone .  Averaging 122/61.  He did have 2 readings in the 90s systolic but has not had any since November.  Still not exercising much.  Rare dizziness and headache that resolves with Tylenol  or ibuprofen .  Denies any blurred vision, shortness of breath or swelling.  Current HTN meds: carvedilol  25mg  twice a day, diltiazem  240mg  daily, irbesartan  300 mg, spironolactone  12.5mg  daily Previously tried: hydrochlorothiazide  (hypokalemia, syncope) BP goal: <130/80 mmHg  Family History:  Family History  Problem Relation Age of Onset   Cancer Mother 27       cervical   Cancer Father        lung   Stroke Brother    Atrial fibrillation Other     Social History: 2 beers almost everyday, no tobacco  Exercise: Walking every  few days  Diet: lost about 10 lb Breakfast: none Lunch: sandwich w/ turkey or boloney  Dinner: frozen dinners - [employer] Drink: water   Home BP readings:  127 64 109 54 127 62 117 58 121 62 96 49 154 76 109 50 104 58 90 52 130 66 132 62 115  58 139 66 141 69 146 72 122.3125 61.125   Wt Readings from Last 3 Encounters:  07/04/24 159 lb (72.1 kg)  05/20/24 165 lb (74.8 kg)  04/18/24 168 lb (76.2 kg)   BP Readings from Last 3 Encounters:  11/27/24 130/72  09/11/24 (!) 160/80  08/12/24 (!) 166/90   Pulse Readings from Last 3 Encounters:  11/27/24 76  09/11/24 63  08/12/24 70    Renal function: CrCl cannot be calculated (Patient's most recent lab result is older than the maximum 21 days allowed.).  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 Echo 2007: mod LVH, diast dysfn, mild MR, no LVOT obstruction;  b. echo 9/13: upper septal thickening, no SAM of MV, no LVOT gradient, mild LVH, EF 65%   TIA (transient ischemic attack)    Vitamin D deficiency     Current Outpatient Medications on File Prior to Visit  Medication Sig Dispense Refill   atorvastatin  (LIPITOR) 40 MG tablet TAKE 1 TABLET(40 MG) BY MOUTH DAILY 90 tablet 3   buPROPion  (WELLBUTRIN  XL) 150 MG 24 hr tablet TAKE 1 TABLET(150 MG) BY MOUTH DAILY 90 tablet 0   carvedilol  (COREG ) 25 MG tablet Take 1 tablet (25 mg total) by mouth 2 (two) times daily. 180 tablet 3   Cholecalciferol (VITAMIN D) 50 MCG (2000 UT) CAPS Take 1 capsule by mouth daily in the afternoon.     clonazePAM  (KLONOPIN ) 0.5 MG tablet TAKE 1  TABLET(0.5 MG) BY MOUTH TWICE DAILY AS NEEDED FOR ANXIETY 30 tablet 0   clopidogrel  (PLAVIX ) 75 MG tablet TAKE 1 TABLET(75 MG) BY MOUTH DAILY 90 tablet 0   diltiazem  (CARDIZEM  CD) 240 MG 24 hr capsule TAKE 1 CAPSULE(240 MG) BY MOUTH DAILY 90 capsule 3   irbesartan  (AVAPRO ) 300 MG tablet Take 1 tablet (300 mg total) by mouth daily. STOP ramipril  90 tablet 3   pantoprazole  (PROTONIX ) 40 MG tablet Take 1 tablet (40 mg total) by mouth daily. 90 tablet 1   spironolactone  (ALDACTONE ) 25 MG tablet Take 0.5 tablets (12.5 mg total) by mouth daily. 45 tablet 3   tamsulosin  (FLOMAX ) 0.4 MG CAPS capsule TAKE 1 CAPSULE(0.4 MG) BY MOUTH DAILY 90 capsule 3   TRINTELLIX  10 MG TABS tablet TAKE 1 TABLET(10 MG) BY MOUTH DAILY 90 tablet 3   acyclovir  ointment (ZOVIRAX ) 5 % Apply 1 application topically daily as needed (cold sores & herpes). 15 g 1   folic acid  (FOLVITE ) 1 MG tablet TAKE 2 TABLETS(2 MG) BY MOUTH DAILY 180 tablet 0   sildenafil  (REVATIO ) 20 MG tablet 3-5 tablets daily as needed (Patient not taking: Reported on 08/12/2024) 50 tablet 6   No current facility-administered medications on file prior to visit.    Allergies  Allergen Reactions   Sulfa Antibiotics     Pt reports causes aches all over   Esomeprazole Magnesium Other (See Comments)    cramps   Septra [Bactrim] Other (See Comments)    Back ache   Sulfamethoxazole-Trimethoprim     Other reaction(s): Other (See Comments) Back ache Back ache Back ache     Blood pressure 130/72, pulse 76.   Assessment/Plan:     1. Hypertension -  HTN (hypertension) Assessment: Blood pressure much improved in clinic today 130/72 Home blood pressures averaging 122/61, with 2 on the lower side and a few in the 140s Patient admits he is not doing as much exercise as he should and plans to start walking every other day Needs BMP since starting spironolactone  He is like his medication is working better for his depression  Plan: Check BMP  today Continue carvedilol  25mg  twice a day, diltiazem  240mg  daily, irbesartan  300 mg, spironolactone  12.5mg  daily Follow-up for yearly appointment with general cardiology-he was brought to the front desk to make his yearly appointment     Thank you,  Eleanor JONETTA Crews, Pharm.JONETTA SARAN, CPP [facility] A Division of [facility] [address] [place], [place] [address]  Phone: [phone]; Fax: [phone]

## 2024-11-27 NOTE — Patient Instructions (Addendum)
 Your blood pressure goal is < 130/37mmHg   Please continue carvedilol  25mg  twice a day, diltiazem  240mg  daily, irbesartan  300 mg, spironolactone  12.5mg  daily  Start walking about 20 min every other day and try to increase over time.  Please schedule an appointment with cardiology for yearly visit.  Important lifestyle changes to control high blood pressure  Intervention  Effect on the BP   Weight loss Weight loss is one of the most effective lifestyle changes for controlling blood pressure. If you're overweight or obese, losing even a small amount of weight can help reduce blood pressure.    Blood pressure can decrease by 1 millimeter of mercury (mmHg) with each kilogram (about 2.2 pounds) of weight lost.   Exercise regularly As a general goal, aim for 30 minutes of moderate physical activity every day.    Regular physical activity can lower blood pressure by 5 - 8 mmHg.   Eat a healthy diet Eat a diet rich in whole grains, fruits, vegetables, lean meat, and low-fat dairy products. Limit processed foods, saturated fat, and sweets.    A heart-healthy diet can lower high blood pressure by 10 mmHg.   Reduce salt (sodium) in your diet Aim for 000mg  of sodium each day. Avoid deli meats, canned food, and frozen microwave meals which are high in sodium.     Limiting sodium can reduce blood pressure by 5 mmHg.   Limit alcohol One drink equals 12 ounces of beer, 5 ounces of wine, or 1.5 ounces of 80-proof liquor.    Limiting alcohol to < 1 drink a day for women or < 2 drinks a day for men can help lower blood pressure by about 4 mmHg.   To check your pressure at home you will need to:   Sit up in a chair, with feet flat on the floor and back supported. Do not cross your ankles or legs. Rest your left arm so that the cuff is about heart level. If the cuff goes on your upper arm, then just relax your arm on the table, arm of the chair, or your lap. If you have a wrist cuff, hold  your wrist against your chest at heart level. Place the cuff snugly around your arm, about 1 inch above the crease of your elbow. The cords should be inside the groove of your elbow.  Sit quietly, with the cuff in place, for about 5 minutes. Then press the power button to start a reading. Do not talk or move while the reading is taking place.  Record your readings on a sheet of paper. Although most cuffs have a memory, it is often easier to see a pattern developing when the numbers are all in front of you.  You can repeat the reading after 1-3 minutes if it is recommended.   Make sure your bladder is empty and you have not had caffeine or tobacco within the last 30 minutes   Always bring your blood pressure log with you to your appointments. If you have not brought your monitor in to be double checked for accuracy, please bring it to your next appointment.   You can find a list of validated (accurate) blood pressure cuffs at: validatebp.org

## 2024-11-27 NOTE — Assessment & Plan Note (Signed)
 Assessment: Blood pressure much improved in clinic today 130/72 Home blood pressures averaging 122/61, with 2 on the lower side and a few in the 140s Patient admits he is not doing as much exercise as he should and plans to start walking every other day Needs BMP since starting spironolactone  He is like his medication is working better for his depression  Plan: Check BMP today Continue carvedilol  25mg  twice a day, diltiazem  240mg  daily, irbesartan  300 mg, spironolactone  12.5mg  daily Follow-up for yearly appointment with general cardiology-he was brought to the front desk to make his yearly appointment

## 2024-12-03 ENCOUNTER — Ambulatory Visit: Payer: Self-pay | Admitting: Pharmacist

## 2024-12-05 NOTE — Progress Notes (Signed)
 Walter Cross                                          MRN: [id number]   12/05/2024   The [facility] Quality Team Specialist reviewed this patient medical record for the purposes of chart review for care gap closure. The following were reviewed: abstraction for care gap closure-controlling blood pressure.    [facility] Quality Team

## 2024-12-23 ENCOUNTER — Ambulatory Visit: Admitting: Cardiology

## 2025-03-21 ENCOUNTER — Ambulatory Visit: Admitting: Cardiology

## 2025-07-10 ENCOUNTER — Encounter
